# Patient Record
Sex: Male | Born: 1950
Health system: Southern US, Community
[De-identification: ages and names within clinical notes are randomized; demographics above are authoritative.]

## PROBLEM LIST (undated history)

## (undated) DIAGNOSIS — I10 Essential (primary) hypertension: Secondary | ICD-10-CM

## (undated) DIAGNOSIS — E785 Hyperlipidemia, unspecified: Secondary | ICD-10-CM

## (undated) DIAGNOSIS — C801 Malignant (primary) neoplasm, unspecified: Secondary | ICD-10-CM

## (undated) DIAGNOSIS — K7460 Unspecified cirrhosis of liver: Secondary | ICD-10-CM

## (undated) DIAGNOSIS — K759 Inflammatory liver disease, unspecified: Secondary | ICD-10-CM

## (undated) DIAGNOSIS — J449 Chronic obstructive pulmonary disease, unspecified: Secondary | ICD-10-CM

## (undated) HISTORY — PX: TONSILLECTOMY: SUR1361

## (undated) HISTORY — PX: COLONOSCOPY: SHX174

---

## 2003-02-03 ENCOUNTER — Encounter: Payer: Self-pay | Admitting: Emergency Medicine

## 2003-02-03 ENCOUNTER — Emergency Department (HOSPITAL_COMMUNITY): Admission: EM | Admit: 2003-02-03 | Discharge: 2003-02-03 | Payer: Self-pay | Admitting: Emergency Medicine

## 2007-12-27 ENCOUNTER — Emergency Department (HOSPITAL_COMMUNITY): Admission: EM | Admit: 2007-12-27 | Discharge: 2007-12-27 | Payer: Self-pay | Admitting: Emergency Medicine

## 2009-03-28 ENCOUNTER — Emergency Department (HOSPITAL_COMMUNITY): Admission: EM | Admit: 2009-03-28 | Discharge: 2009-03-28 | Payer: Self-pay | Admitting: Emergency Medicine

## 2009-04-03 ENCOUNTER — Emergency Department (HOSPITAL_COMMUNITY): Admission: EM | Admit: 2009-04-03 | Discharge: 2009-04-03 | Payer: Self-pay | Admitting: Family Medicine

## 2013-01-23 ENCOUNTER — Encounter (HOSPITAL_COMMUNITY): Payer: Self-pay | Admitting: Emergency Medicine

## 2013-01-23 ENCOUNTER — Emergency Department (HOSPITAL_COMMUNITY)
Admission: EM | Admit: 2013-01-23 | Discharge: 2013-01-24 | Disposition: A | Discharge status: Home / Self Care | Payer: BC Managed Care – PPO | Attending: Emergency Medicine | Admitting: Emergency Medicine

## 2013-01-23 DIAGNOSIS — M79609 Pain in unspecified limb: Secondary | ICD-10-CM | POA: Insufficient documentation

## 2013-01-23 DIAGNOSIS — F17200 Nicotine dependence, unspecified, uncomplicated: Secondary | ICD-10-CM | POA: Insufficient documentation

## 2013-01-23 DIAGNOSIS — I82401 Acute embolism and thrombosis of unspecified deep veins of right lower extremity: Secondary | ICD-10-CM

## 2013-01-23 LAB — D-DIMER, QUANTITATIVE: D-Dimer, Quant: 2.08 ug/mL-FEU — ABNORMAL HIGH (ref 0.00–0.48)

## 2013-01-23 MED ORDER — KETOROLAC TROMETHAMINE 60 MG/2ML IM SOLN
60.0000 mg | Freq: Once | INTRAMUSCULAR | Status: AC
  Administered 2013-01-23: 60 mg via INTRAMUSCULAR
  Filled 2013-01-23: qty 2

## 2013-01-23 MED ORDER — HYDROCODONE-ACETAMINOPHEN 5-325 MG PO TABS: 1.0000 | ORAL_TABLET | ORAL | Status: DC | PRN

## 2013-01-23 MED ORDER — ENOXAPARIN SODIUM 100 MG/ML ~~LOC~~ SOLN
100.0000 mg | Freq: Once | SUBCUTANEOUS | Status: AC
  Administered 2013-01-23: 100 mg via SUBCUTANEOUS
  Filled 2013-01-23 (×2): qty 1

## 2013-01-23 NOTE — ED Provider Notes (Signed)
CSN: [id number]     Arrival date & time 01/23/13  1952 History  This chart was scribed for non-physician practitioner Dierdre Forth, PA-C, working with Candyce Churn, MD by Leone Payor, ED Scribe. This patient was seen in room TR11C/TR11C and the patient's care was started at 1952.    Chief Complaint  Patient presents with  . Muscle Pain    The history is provided by the patient. No language interpreter was used.    HPI Comments: Wyatt Berry is a 62 y.o. male who presents to the Emergency Department complaining of gradually onset, constant, gradually worsening, moderate right calf pain that began 3 days ago. He denies any recent injury or trauma to the affected area. He denies strenuous activities such as playing sports or running.  He denies any associated swelling or redness. He denies any knee pain. Laying down worsens the pain. He has taken OTC pain medication with some relief. He denies fever, chills, nausea, emesis, chest pain, SOB. He denies history of CA, DVT or PE in the past. Pt is a current everyday 1 pack per day smoker and occasional alcohol user. Pt denies recent surgery, active cancer, long car/plane trips, sitting for long periods, broken bones, Hx of blood clot.  History reviewed. No pertinent past medical history. History reviewed. No pertinent past surgical history. No family history on file. History  Substance Use Topics  . Smoking status: Current Every Day Smoker  . Smokeless tobacco: Not on file  . Alcohol Use: Yes    Review of Systems  Musculoskeletal: Positive for myalgias (right calf pain).  All other systems reviewed and are negative.    Allergies  Review of patient's allergies indicates no known allergies.  Home Medications   Current Outpatient Rx  Name  Route  Sig  Dispense  Refill  . aspirin EC 325 MG tablet   Oral   Take 650 mg by mouth every 4 (four) hours as needed for pain.         Marland Kitchen HYDROcodone-acetaminophen (NORCO/VICODIN)  5-325 MG per tablet   Oral   Take 1-2 tablets by mouth every 4 (four) hours as needed for pain.   6 tablet   0    BP 180/100  Pulse 78  Temp(Src) 97.6 F (36.4 C) (Oral)  Resp 20  Ht 6\' 2"  (1.88 m)  Wt 235 lb (106.595 kg)  BMI 30.16 kg/m2  SpO2 98% Physical Exam  Nursing note and vitals reviewed. Constitutional: He is oriented to person, place, and time. He appears well-developed and well-nourished. No distress.  HENT:  Head: Normocephalic and atraumatic.  Eyes: Conjunctivae are normal.  Neck: Normal range of motion.  Cardiovascular: Normal rate, regular rhythm, normal heart sounds and intact distal pulses.   No murmur heard. Pulses:      Radial pulses are 2+ on the right side, and 2+ on the left side.       Dorsalis pedis pulses are 2+ on the right side, and 2+ on the left side.       Posterior tibial pulses are 2+ on the right side, and 2+ on the left side.  Capillary refill < 3 sec  Pulmonary/Chest: Effort normal and breath sounds normal. He has no decreased breath sounds. He has no wheezes. He has no rhonchi. He has no rales.  Musculoskeletal: Normal range of motion. He exhibits tenderness. He exhibits no edema.  ROM: Full ROM of bilateral lower extremities.  No pain to palpation of the right calf,  no palpable cord Positive Homan's sign on the right leg only Right leg: No swelling of the calf, no swelling of the lower leg, no edema  Neurological: He is alert and oriented to person, place, and time. Coordination normal.  Sensation intact to dull and sharp Strength 5/5 in the bilateral lower extremities  Skin: Skin is warm and dry. No rash noted. He is not diaphoretic. No erythema.  No chronic skin changes noted No tenting of the skin  Psychiatric: He has a normal mood and affect.    ED Course  Procedures (including critical care time)  DIAGNOSTIC STUDIES: Oxygen Saturation is 97% on RA, normal by my interpretation.    COORDINATION OF CARE: 10:15 PM Discussed  treatment plan with pt at bedside and pt agreed to plan.   Labs Review Labs Reviewed  D-DIMER, QUANTITATIVE - Abnormal; Notable for the following:    D-Dimer, Quant 2.08 (*)    All other components within normal limits   Imaging Review No results found.  MDM   1. Suspected DVT (deep vein thrombosis), right    Wyatt Berry presents with nontraumatic right calf pain for several days.  Pt is low risk on the Well's DVT score and he has no pain to palpation of the calf, no unilateral (or bilateral) swelling of the lower legs, no pitting edema, no palpable cord.  Of concern is that I do not have a source for the patient's pain.  Will obtain d-dimer as it is too late for a vascular study.  Pt is without chest pain, SOB or tachycardia.   (Pt initial vitals noted with tachycardia, but no tachycardia on my PE.)  Pt also without hypoxia on room air and denies cough or hemoptysis.    D-dimer significantly elevated at 2.08.  Significant concern for DVT.  Will dose with lovenox and schedule venous doppler for tomorrow morning.  Pt has been advised of this and is in agreement with the plan.    It has been determined that no acute conditions requiring further emergency intervention are present at this time. The patient/guardian have been advised of the diagnosis and plan. We have discussed signs and symptoms that warrant return to the ED, such as changes or worsening in symptoms.   Vital signs are stable at discharge.   BP 180/100  Pulse 78  Temp(Src) 97.6 F (36.4 C) (Oral)  Resp 20  Ht 6\' 2"  (1.88 m)  Wt 235 lb (106.595 kg)  BMI 30.16 kg/m2  SpO2 98%  Patient/guardian has voiced understanding and agreed to follow-up with the PCP or specialist.      Dierdre Forth, PA-C 01/24/13 0154

## 2013-01-23 NOTE — ED Notes (Signed)
Pt. reports right lower leg muscle ache for 2 days denies injury / ambulatory.

## 2013-01-24 ENCOUNTER — Ambulatory Visit (HOSPITAL_COMMUNITY)
Admission: RE | Admit: 2013-01-24 | Discharge: 2013-01-24 | Disposition: A | Discharge status: Home / Self Care | Payer: BC Managed Care – PPO | Source: Ambulatory Visit | Attending: Emergency Medicine | Admitting: Emergency Medicine

## 2013-01-24 DIAGNOSIS — M79609 Pain in unspecified limb: Secondary | ICD-10-CM | POA: Insufficient documentation

## 2013-01-24 NOTE — Progress Notes (Signed)
VASCULAR LAB PRELIMINARY  PRELIMINARY  PRELIMINARY  PRELIMINARY  Lower extremity venous Doppler completed.    Preliminary report:  There is no DVT or SVT noted in the right lower extremity.   Shametra Cumberland, RVT 01/24/2013, 10:53 AM

## 2013-01-26 NOTE — ED Provider Notes (Signed)
Medical screening examination/treatment/procedure(s) were performed by non-physician practitioner and as supervising physician I was immediately available for consultation/collaboration.  Candyce Churn, MD 01/26/13 1146

## 2013-01-30 ENCOUNTER — Encounter (HOSPITAL_COMMUNITY): Payer: Self-pay | Admitting: Emergency Medicine

## 2013-01-30 ENCOUNTER — Emergency Department (INDEPENDENT_AMBULATORY_CARE_PROVIDER_SITE_OTHER)
Admission: EM | Admit: 2013-01-30 | Discharge: 2013-01-30 | Disposition: A | Discharge status: Home / Self Care | Payer: BC Managed Care – PPO | Source: Home / Self Care

## 2013-01-30 DIAGNOSIS — M79604 Pain in right leg: Secondary | ICD-10-CM

## 2013-01-30 DIAGNOSIS — M79609 Pain in unspecified limb: Secondary | ICD-10-CM

## 2013-01-30 MED ORDER — HYDROCODONE-ACETAMINOPHEN 5-325 MG PO TABS: 1.0000 | ORAL_TABLET | ORAL | Status: DC | PRN

## 2013-01-30 NOTE — ED Provider Notes (Signed)
CSN: [id number]     Arrival date & time 01/30/13  1256 History   First MD Initiated Contact with Patient 01/30/13 1416     Chief Complaint  Patient presents with  . Leg Pain   (Consider location/radiation/quality/duration/timing/severity/associated sxs/prior Treatment) HPI Comments: 62 year old male is being seen for pain in the right calf that began last week. He presented to the emergency department for the same complaint and was evaluated. The d-dimer was 2.08, lthough there were no specific clinical findings on exam. He was placed on anticoagulation for the day and underwent venous duplex of the right lower extremity following day. Results of that was no evidence of DVT or superficial thrombophlebitis. He was treated with analgesics and discharged to followup with PCP.  Patient presents today with persistent pain in the right leg is in the posterior aspect of the leg. He states that it is not made worse by walking but does experience pain deep into the calf muscle. After walking and upon resting he has a throbbing pain in his leg. He denies injury or known recent or past trauma.   History reviewed. No pertinent past medical history. History reviewed. No pertinent past surgical history. History reviewed. No pertinent family history. History  Substance Use Topics  . Smoking status: Current Every Day Smoker  . Smokeless tobacco: Not on file  . Alcohol Use: Yes    Review of Systems  Constitutional: Negative.   HENT: Negative.   Respiratory: Negative.   Cardiovascular: Negative.   Gastrointestinal: Negative.   Genitourinary: Negative.   Musculoskeletal:       As per HPI  Skin: Negative.  Negative for color change, pallor, rash and wound.  Neurological: Negative for dizziness, weakness, numbness and headaches.    Allergies  Review of patient's allergies indicates no known allergies.  Home Medications   Current Outpatient Rx  Name  Route  Sig  Dispense  Refill  . aspirin EC  325 MG tablet   Oral   Take 650 mg by mouth every 4 (four) hours as needed for pain.         Marland Kitchen HYDROcodone-acetaminophen (NORCO/VICODIN) 5-325 MG per tablet   Oral   Take 1-2 tablets by mouth every 4 (four) hours as needed for pain.   6 tablet   0   . HYDROcodone-acetaminophen (NORCO/VICODIN) 5-325 MG per tablet   Oral   Take 1 tablet by mouth every 4 (four) hours as needed for pain.   20 tablet   0    BP 102/78  Pulse 121  Temp(Src) 98.5 F (36.9 C) (Oral)  Resp 22  SpO2 98% Physical Exam  Nursing note and vitals reviewed. Constitutional: He is oriented to person, place, and time. He appears well-developed and well-nourished. No distress.  HENT:  Head: Normocephalic and atraumatic.  Eyes: EOM are normal. Left eye exhibits no discharge.  Neck: Normal range of motion. Neck supple.  Cardiovascular: Regular rhythm and normal heart sounds.   Borderline tachycardia at a rate of 108 during exam.  Pulmonary/Chest: Effort normal and breath sounds normal. No respiratory distress. He has no wheezes. He has no rales.  Musculoskeletal: He exhibits no edema.  Right and left lower legs are symmetric. There appears to be no swelling/edema to the right lower extremity. Patient points to the proximal gastrocnemius as the most likely source of pain. Palpation of these areas reveal only minor tenderness. There is no discoloration or localized edema. Range of motion of the knee, ankle and toes are within  normal limits. Distal sensation is intact. Range of motion is intact. Although the pedal pulse cannot be detected by palpation auscultation by Doppler reveals a bold, strong regular pedal artery pulse.No warmth and color.  Neurological: He is alert and oriented to person, place, and time. No cranial nerve deficit.  Skin: Skin is warm and dry.  Psychiatric: He has a normal mood and affect.    ED Course  Procedures (including critical care time) Labs Review Labs Reviewed - No data to  display Imaging Review No results found.  MDM   1. Leg pain, posterior, right     Prescribed Norco 5 mg every 4-6 hours when necessary pain #20 The evaluation and test results are from the ER at the end of last week were reviewed. Although there was an increase in the d-dimer the venous duplex was negative.  The examination today was similar to that documented in emergency department in remarkable only for minor tenderness to deep palpation to the posterior calf. Differential includes musculoskeletal, small arterial ischemia.      Hayden Rasmussen, NP 01/30/13 1456

## 2013-01-30 NOTE — ED Provider Notes (Signed)
Medical screening examination/treatment/procedure(s) were performed by resident physician or non-physician practitioner and as supervising physician I was immediately available for consultation/collaboration.   Remonia Otte DOUGLAS MD.   Theadora Noyes D Bereket Gernert, MD 01/30/13 2024 

## 2013-01-30 NOTE — ED Notes (Signed)
C/o right leg pain which started last Thursday.   Patient has been to ER and r/o DVT.  Patient states that he has not had any changes of pain since he has been at ER.

## 2013-04-15 ENCOUNTER — Other Ambulatory Visit: Payer: Self-pay | Admitting: Gastroenterology

## 2013-05-21 ENCOUNTER — Ambulatory Visit (HOSPITAL_COMMUNITY): Admit: 2013-05-21 | Payer: Self-pay | Admitting: Gastroenterology

## 2013-05-21 ENCOUNTER — Encounter (HOSPITAL_COMMUNITY): Payer: Self-pay

## 2013-05-21 SURGERY — COLONOSCOPY
Anesthesia: Moderate Sedation

## 2013-09-01 ENCOUNTER — Emergency Department (HOSPITAL_COMMUNITY)
Admission: EM | Admit: 2013-09-01 | Discharge: 2013-09-02 | Disposition: A | Discharge status: Home / Self Care | Payer: BC Managed Care – PPO | Attending: Emergency Medicine | Admitting: Emergency Medicine

## 2013-09-01 ENCOUNTER — Emergency Department (HOSPITAL_COMMUNITY): Payer: BC Managed Care – PPO

## 2013-09-01 ENCOUNTER — Encounter (HOSPITAL_COMMUNITY): Payer: Self-pay | Admitting: Emergency Medicine

## 2013-09-01 DIAGNOSIS — M1990 Unspecified osteoarthritis, unspecified site: Secondary | ICD-10-CM

## 2013-09-01 DIAGNOSIS — S79929A Unspecified injury of unspecified thigh, initial encounter: Principal | ICD-10-CM

## 2013-09-01 DIAGNOSIS — X500XXA Overexertion from strenuous movement or load, initial encounter: Secondary | ICD-10-CM | POA: Insufficient documentation

## 2013-09-01 DIAGNOSIS — I10 Essential (primary) hypertension: Secondary | ICD-10-CM | POA: Insufficient documentation

## 2013-09-01 DIAGNOSIS — M4310 Spondylolisthesis, site unspecified: Secondary | ICD-10-CM | POA: Insufficient documentation

## 2013-09-01 DIAGNOSIS — Y9389 Activity, other specified: Secondary | ICD-10-CM | POA: Insufficient documentation

## 2013-09-01 DIAGNOSIS — S79919A Unspecified injury of unspecified hip, initial encounter: Secondary | ICD-10-CM | POA: Insufficient documentation

## 2013-09-01 DIAGNOSIS — M4316 Spondylolisthesis, lumbar region: Secondary | ICD-10-CM

## 2013-09-01 DIAGNOSIS — Y929 Unspecified place or not applicable: Secondary | ICD-10-CM | POA: Insufficient documentation

## 2013-09-01 DIAGNOSIS — F17200 Nicotine dependence, unspecified, uncomplicated: Secondary | ICD-10-CM | POA: Insufficient documentation

## 2013-09-01 DIAGNOSIS — M129 Arthropathy, unspecified: Secondary | ICD-10-CM | POA: Insufficient documentation

## 2013-09-01 DIAGNOSIS — Z79899 Other long term (current) drug therapy: Secondary | ICD-10-CM | POA: Insufficient documentation

## 2013-09-01 HISTORY — DX: Essential (primary) hypertension: I10

## 2013-09-01 MED ORDER — HYDROCODONE-ACETAMINOPHEN 5-325 MG PO TABS: 1.0000 | ORAL_TABLET | Freq: Four times a day (QID) | ORAL | Status: DC | PRN

## 2013-09-01 MED ORDER — DIAZEPAM 5 MG/ML IJ SOLN
5.0000 mg | Freq: Once | INTRAMUSCULAR | Status: AC
  Administered 2013-09-01: 5 mg via INTRAVENOUS
  Filled 2013-09-01: qty 2

## 2013-09-01 MED ORDER — HYDROMORPHONE HCL PF 1 MG/ML IJ SOLN
1.0000 mg | Freq: Once | INTRAMUSCULAR | Status: AC
  Administered 2013-09-01: 1 mg via INTRAVENOUS
  Filled 2013-09-01: qty 1

## 2013-09-01 NOTE — ED Provider Notes (Signed)
CSN: [id number]     Arrival date & time 09/01/13  2116 History   First MD Initiated Contact with Patient 09/01/13 2146     Chief Complaint  Patient presents with  . Hip Pain     (Consider location/radiation/quality/duration/timing/severity/associated sxs/prior Treatment) The history is provided by the patient.  ORRY SIGL is a 63 y.o. male hx of HTN here with R hip pain. Right hip pain for the last 3-4 days. It is worse with movement. Denies any falls or injury. Today he was laying on the couch and tried to get up and also heard a pop around the right hip area and suddenly had pain radiating down from the hip to the leg. Denies any back pain or injury. Denies weakness or numbness.    Past Medical History  Diagnosis Date  . Hypertension    History reviewed. No pertinent past surgical history. No family history on file. History  Substance Use Topics  . Smoking status: Current Every Day Smoker  . Smokeless tobacco: Not on file  . Alcohol Use: Yes    Review of Systems  Musculoskeletal:       R hip pain   All other systems reviewed and are negative.     Allergies  Review of patient's allergies indicates no known allergies.  Home Medications   Current Outpatient Rx  Name  Route  Sig  Dispense  Refill  . ibuprofen (ADVIL,MOTRIN) 200 MG tablet   Oral   Take 200 mg by mouth every 6 (six) hours as needed for moderate pain.         Marland Kitchen losartan-hydrochlorothiazide (HYZAAR) 100-12.5 MG per tablet   Oral   Take 1 tablet by mouth daily.         . tamsulosin (FLOMAX) 0.4 MG CAPS capsule   Oral   Take 0.4 mg by mouth daily after supper.          BP 127/71  Pulse 79  Temp(Src) 97.6 F (36.4 C) (Oral)  Resp 20  SpO2 98% Physical Exam  Nursing note and vitals reviewed. Constitutional: He is oriented to person, place, and time. He appears well-developed and well-nourished.  Slightly uncomfortable   HENT:  Head: Normocephalic.  Mouth/Throat: Oropharynx is clear  and moist.  Eyes: Conjunctivae are normal. Pupils are equal, round, and reactive to light.  Neck: Normal range of motion. Neck supple.  Cardiovascular: Normal rate, regular rhythm and normal heart sounds.   Pulmonary/Chest: Effort normal and breath sounds normal. No respiratory distress. He has no wheezes. He has no rales.  Abdominal: Soft. Bowel sounds are normal. He exhibits no distension. There is no tenderness.  Musculoskeletal: Normal range of motion.  No midline spinal tenderness. + straight leg raise R side. Nl ROM R hip   Neurological: He is alert and oriented to person, place, and time.  No saddle anesthesia   Skin: Skin is warm and dry.  Psychiatric: He has a normal mood and affect. His behavior is normal. Judgment and thought content normal.    ED Course  Procedures (including critical care time) Labs Review Labs Reviewed - No data to display Imaging Review Dg Lumbar Spine Complete  09/01/2013   CLINICAL DATA:  Back pain.  EXAM: LUMBAR SPINE - COMPLETE 4+ VIEW  COMPARISON:  None available for comparison at time of study interpretation.  FINDINGS: Apparent transitional anatomy, partially sacralized L5 vertebral body. Grade 1 L4-5 anterolisthesis without pars interarticularis defect. Moderate to severe L5-S1 degenerative disc disease, moderate L4-5  with severe lower lumbar facet arthropathy.  No destructive bony lesions. Sacroiliac joints are symmetric with mild degenerative change. Moderate aortoiliac vascular calcifications.  IMPRESSION: No acute fracture deformity. Grade 1 L4-5 anterolisthesis on degenerative basis. Transitional anatomy with sacralized L5 vertebral body.   Electronically Signed   By: Elon Alas   On: 09/01/2013 22:56   Dg Hip Complete Right  09/01/2013   CLINICAL DATA:  Right hip pain  EXAM: RIGHT HIP - COMPLETE 2+ VIEW  COMPARISON:  None.  FINDINGS: There is no evidence of hip fracture or dislocation. There is no evidence of arthropathy or other focal bone  abnormality.  IMPRESSION: Negative.   Electronically Signed   By: Kathreen Devoid   On: 09/01/2013 22:21     EKG Interpretation None      MDM   Final diagnoses:  None    Wyatt Berry is a 63 y.o. male here with R hip pain. I think likely radiating from back. Likely sciatica symptoms. No signs of cauda equina and was able to ambulate after pain meds. Xray showed L4-5 anterolisthesis likely causing the symptoms. Xray hip unremarkable. Will give pain meds, refer to ortho.     Wandra Arthurs, MD 09/01/13 2322

## 2013-09-01 NOTE — Discharge Instructions (Signed)
Take motrin 600 mg every 6 hrs for pain.   Take vicodin as prescribed for severe pain.   Follow up with an orthopedic doctor.   Return to ER if you have severe pain, weakness, numbness, trouble urinating.

## 2013-09-01 NOTE — ED Notes (Signed)
Per PTAR: Pt c/o R hip pain since Thursday last week, pain worsened today. Denies fall or trauma. Pain first started when pt got up out of bed Thursday morning. Pt states he felt a pop in hip tonight when getting up from couch. No deformities noted. No shortening or rotation, good pulses in both extremities. Pt was ambulatory prior to tonight. Able to move toes. A&O x 4.

## 2013-09-01 NOTE — ED Notes (Signed)
Bed: WA03 Expected date:  Expected time:  Means of arrival:  Comments: EMS 63yo M hip pain, felt a pop

## 2014-08-22 ENCOUNTER — Other Ambulatory Visit: Payer: Self-pay | Admitting: Physician Assistant

## 2015-03-03 ENCOUNTER — Other Ambulatory Visit: Payer: Self-pay | Admitting: Internal Medicine

## 2015-03-03 DIAGNOSIS — R7989 Other specified abnormal findings of blood chemistry: Secondary | ICD-10-CM

## 2015-03-03 DIAGNOSIS — R945 Abnormal results of liver function studies: Principal | ICD-10-CM

## 2015-03-05 ENCOUNTER — Other Ambulatory Visit: Payer: Self-pay | Admitting: Internal Medicine

## 2015-03-05 ENCOUNTER — Ambulatory Visit
Admission: RE | Admit: 2015-03-05 | Discharge: 2015-03-05 | Disposition: A | Discharge status: Home / Self Care | Payer: BC Managed Care – PPO | Source: Ambulatory Visit | Attending: Internal Medicine | Admitting: Internal Medicine

## 2015-03-05 DIAGNOSIS — R945 Abnormal results of liver function studies: Principal | ICD-10-CM

## 2015-03-05 DIAGNOSIS — R7989 Other specified abnormal findings of blood chemistry: Secondary | ICD-10-CM

## 2015-03-17 ENCOUNTER — Other Ambulatory Visit: Payer: BC Managed Care – PPO

## 2015-03-18 ENCOUNTER — Telehealth: Payer: Self-pay | Admitting: Lab

## 2015-03-18 NOTE — Telephone Encounter (Signed)
Pt scheduled for labs 03/23/2015-He will then be given an appmt with Dr. Linus Salmons

## 2015-03-23 ENCOUNTER — Other Ambulatory Visit: Payer: BC Managed Care – PPO

## 2015-03-23 DIAGNOSIS — B182 Chronic viral hepatitis C: Secondary | ICD-10-CM

## 2015-03-23 LAB — HEPATITIS B SURFACE ANTIGEN: Hepatitis B Surface Ag: NEGATIVE

## 2015-03-23 LAB — HEPATITIS A ANTIBODY, TOTAL: HEP A TOTAL AB: NONREACTIVE

## 2015-03-23 LAB — IRON: Iron: 186 ug/dL — ABNORMAL HIGH (ref 50–180)

## 2015-03-23 LAB — HEPATITIS B SURFACE ANTIBODY, QUANTITATIVE: Hepatitis B-Post: 0 m[IU]/mL

## 2015-03-23 LAB — HEPATITIS B CORE ANTIBODY, TOTAL: Hep B Core Total Ab: NONREACTIVE

## 2015-03-23 NOTE — Telephone Encounter (Signed)
Diane, please see the previous message.

## 2015-03-23 NOTE — Telephone Encounter (Signed)
Pt's PCP called and decided they want the pt to go to the [facility] instead. PCP is requesting that all lab results be sent back to them @ fax [phone]. Please advise

## 2015-03-23 NOTE — Telephone Encounter (Signed)
Pt came in for labs today and given an appt with Dr. Linus Salmons for 05/06/2015-PCP's contact informed.

## 2015-03-24 LAB — PROTIME-INR
INR: 1.05 (ref <1.50)
Prothrombin Time: 13.8 seconds (ref 11.6–15.2)

## 2015-03-24 LAB — ANA: Anti Nuclear Antibody(ANA): NEGATIVE

## 2015-03-24 LAB — HIV ANTIBODY (ROUTINE TESTING W REFLEX): HIV 1&2 Ab, 4th Generation: NONREACTIVE

## 2015-03-24 LAB — HEPATITIS C RNA QUANTITATIVE
HCV QUANT: 146023 [IU]/mL — AB (ref <15)
HCV Quantitative Log: 5.16 {Log} — ABNORMAL HIGH (ref <1.18)

## 2015-03-26 ENCOUNTER — Other Ambulatory Visit: Payer: Self-pay | Admitting: Internal Medicine

## 2015-03-26 LAB — HEPATITIS C GENOTYPE: HCV GENOTYPE: 1

## 2015-03-30 ENCOUNTER — Other Ambulatory Visit (HOSPITAL_COMMUNITY): Payer: Self-pay | Admitting: Nurse Practitioner

## 2015-03-30 DIAGNOSIS — B1920 Unspecified viral hepatitis C without hepatic coma: Secondary | ICD-10-CM

## 2015-04-29 ENCOUNTER — Ambulatory Visit (HOSPITAL_COMMUNITY)
Admission: RE | Admit: 2015-04-29 | Discharge: 2015-04-29 | Disposition: A | Discharge status: Home / Self Care | Payer: BC Managed Care – PPO | Source: Ambulatory Visit | Attending: Nurse Practitioner | Admitting: Nurse Practitioner

## 2015-04-29 DIAGNOSIS — R932 Abnormal findings on diagnostic imaging of liver and biliary tract: Secondary | ICD-10-CM | POA: Insufficient documentation

## 2015-04-29 DIAGNOSIS — B1920 Unspecified viral hepatitis C without hepatic coma: Secondary | ICD-10-CM | POA: Insufficient documentation

## 2015-05-06 ENCOUNTER — Encounter: Payer: BC Managed Care – PPO | Admitting: Internal Medicine

## 2015-10-12 ENCOUNTER — Other Ambulatory Visit: Payer: Self-pay | Admitting: Nurse Practitioner

## 2015-10-12 DIAGNOSIS — K7469 Other cirrhosis of liver: Secondary | ICD-10-CM

## 2015-10-20 ENCOUNTER — Ambulatory Visit
Admission: RE | Admit: 2015-10-20 | Discharge: 2015-10-20 | Disposition: A | Discharge status: Home / Self Care | Payer: BC Managed Care – PPO | Source: Ambulatory Visit | Attending: Nurse Practitioner | Admitting: Nurse Practitioner

## 2015-10-20 DIAGNOSIS — K7469 Other cirrhosis of liver: Secondary | ICD-10-CM

## 2016-03-01 ENCOUNTER — Other Ambulatory Visit: Payer: Self-pay | Admitting: Nurse Practitioner

## 2016-03-01 DIAGNOSIS — K7469 Other cirrhosis of liver: Secondary | ICD-10-CM

## 2016-04-11 ENCOUNTER — Ambulatory Visit
Admission: RE | Admit: 2016-04-11 | Discharge: 2016-04-11 | Disposition: A | Discharge status: Home / Self Care | Payer: BC Managed Care – PPO | Source: Ambulatory Visit | Attending: Nurse Practitioner | Admitting: Nurse Practitioner

## 2016-04-11 DIAGNOSIS — K7469 Other cirrhosis of liver: Secondary | ICD-10-CM

## 2016-05-01 ENCOUNTER — Inpatient Hospital Stay (HOSPITAL_COMMUNITY)
Admission: EM | Admit: 2016-05-01 | Discharge: 2016-05-06 | DRG: 175 | Disposition: A | Discharge status: Home / Self Care | Payer: BC Managed Care – PPO | Attending: Family Medicine | Admitting: Family Medicine

## 2016-05-01 ENCOUNTER — Emergency Department (HOSPITAL_COMMUNITY): Payer: BC Managed Care – PPO

## 2016-05-01 ENCOUNTER — Encounter (HOSPITAL_COMMUNITY): Payer: Self-pay

## 2016-05-01 DIAGNOSIS — J439 Emphysema, unspecified: Secondary | ICD-10-CM | POA: Diagnosis present

## 2016-05-01 DIAGNOSIS — K7460 Unspecified cirrhosis of liver: Secondary | ICD-10-CM | POA: Diagnosis present

## 2016-05-01 DIAGNOSIS — D6851 Activated protein C resistance: Secondary | ICD-10-CM | POA: Diagnosis present

## 2016-05-01 DIAGNOSIS — Z833 Family history of diabetes mellitus: Secondary | ICD-10-CM

## 2016-05-01 DIAGNOSIS — Z8249 Family history of ischemic heart disease and other diseases of the circulatory system: Secondary | ICD-10-CM

## 2016-05-01 DIAGNOSIS — F17210 Nicotine dependence, cigarettes, uncomplicated: Secondary | ICD-10-CM | POA: Diagnosis present

## 2016-05-01 DIAGNOSIS — I071 Rheumatic tricuspid insufficiency: Secondary | ICD-10-CM | POA: Diagnosis present

## 2016-05-01 DIAGNOSIS — I2692 Saddle embolus of pulmonary artery without acute cor pulmonale: Principal | ICD-10-CM | POA: Diagnosis present

## 2016-05-01 DIAGNOSIS — B1920 Unspecified viral hepatitis C without hepatic coma: Secondary | ICD-10-CM | POA: Diagnosis present

## 2016-05-01 DIAGNOSIS — I10 Essential (primary) hypertension: Secondary | ICD-10-CM | POA: Diagnosis present

## 2016-05-01 DIAGNOSIS — Z79899 Other long term (current) drug therapy: Secondary | ICD-10-CM

## 2016-05-01 DIAGNOSIS — I2602 Saddle embolus of pulmonary artery with acute cor pulmonale: Secondary | ICD-10-CM | POA: Diagnosis present

## 2016-05-01 DIAGNOSIS — R7303 Prediabetes: Secondary | ICD-10-CM | POA: Diagnosis present

## 2016-05-01 DIAGNOSIS — I824Y2 Acute embolism and thrombosis of unspecified deep veins of left proximal lower extremity: Secondary | ICD-10-CM

## 2016-05-01 DIAGNOSIS — I82412 Acute embolism and thrombosis of left femoral vein: Secondary | ICD-10-CM | POA: Diagnosis present

## 2016-05-01 DIAGNOSIS — R55 Syncope and collapse: Secondary | ICD-10-CM | POA: Diagnosis not present

## 2016-05-01 DIAGNOSIS — J9601 Acute respiratory failure with hypoxia: Secondary | ICD-10-CM | POA: Diagnosis present

## 2016-05-01 DIAGNOSIS — I272 Pulmonary hypertension, unspecified: Secondary | ICD-10-CM | POA: Diagnosis present

## 2016-05-01 DIAGNOSIS — I959 Hypotension, unspecified: Secondary | ICD-10-CM | POA: Diagnosis present

## 2016-05-01 DIAGNOSIS — D6852 Prothrombin gene mutation: Secondary | ICD-10-CM | POA: Diagnosis present

## 2016-05-01 DIAGNOSIS — D72829 Elevated white blood cell count, unspecified: Secondary | ICD-10-CM | POA: Diagnosis present

## 2016-05-01 DIAGNOSIS — I2609 Other pulmonary embolism with acute cor pulmonale: Secondary | ICD-10-CM

## 2016-05-01 DIAGNOSIS — E871 Hypo-osmolality and hyponatremia: Secondary | ICD-10-CM | POA: Diagnosis present

## 2016-05-01 HISTORY — DX: Inflammatory liver disease, unspecified: K75.9

## 2016-05-01 LAB — URINALYSIS, ROUTINE W REFLEX MICROSCOPIC
BILIRUBIN URINE: NEGATIVE
Bacteria, UA: NONE SEEN
GLUCOSE, UA: NEGATIVE mg/dL
HGB URINE DIPSTICK: NEGATIVE
KETONES UR: NEGATIVE mg/dL
LEUKOCYTES UA: NEGATIVE
NITRITE: NEGATIVE
PROTEIN: 30 mg/dL — AB
Specific Gravity, Urine: 1.021 (ref 1.005–1.030)
Squamous Epithelial / LPF: NONE SEEN
pH: 5 (ref 5.0–8.0)

## 2016-05-01 LAB — DIFFERENTIAL
BASOS ABS: 0 10*3/uL (ref 0.0–0.1)
BASOS PCT: 0 %
EOS ABS: 0 10*3/uL (ref 0.0–0.7)
EOS PCT: 0 %
LYMPHS ABS: 2 10*3/uL (ref 0.7–4.0)
Lymphocytes Relative: 14 %
Monocytes Absolute: 0.9 10*3/uL (ref 0.1–1.0)
Monocytes Relative: 6 %
NEUTROS PCT: 80 %
Neutro Abs: 11.2 10*3/uL — ABNORMAL HIGH (ref 1.7–7.7)

## 2016-05-01 LAB — BASIC METABOLIC PANEL
ANION GAP: 11 (ref 5–15)
BUN: 13 mg/dL (ref 6–20)
CHLORIDE: 100 mmol/L — AB (ref 101–111)
CO2: 21 mmol/L — ABNORMAL LOW (ref 22–32)
Calcium: 8.7 mg/dL — ABNORMAL LOW (ref 8.9–10.3)
Creatinine, Ser: 1.17 mg/dL (ref 0.61–1.24)
GFR calc non Af Amer: >60 mL/min (ref >60)
Glucose, Bld: 162 mg/dL — ABNORMAL HIGH (ref 65–99)
POTASSIUM: 3.7 mmol/L (ref 3.5–5.1)
Sodium: 132 mmol/L — ABNORMAL LOW (ref 135–145)

## 2016-05-01 LAB — CBC
HCT: 40.7 % (ref 39.0–52.0)
Hemoglobin: 14.8 g/dL (ref 13.0–17.0)
MCH: 31.4 pg (ref 26.0–34.0)
MCHC: 36.4 g/dL — ABNORMAL HIGH (ref 30.0–36.0)
MCV: 86.4 fL (ref 78.0–100.0)
Platelets: 185 10*3/uL (ref 150–400)
RBC: 4.71 MIL/uL (ref 4.22–5.81)
RDW: 13.8 % (ref 11.5–15.5)
WBC: 14 10*3/uL — ABNORMAL HIGH (ref 4.0–10.5)

## 2016-05-01 LAB — CBG MONITORING, ED: GLUCOSE-CAPILLARY: 176 mg/dL — AB (ref 65–99)

## 2016-05-01 NOTE — ED Provider Notes (Signed)
[facility] [place] DEPT Provider Note   CSN: [id number] Arrival date & time: 05/01/16  2223     History   Chief Complaint Chief Complaint  Patient presents with  . Loss of Consciousness    HPI Wyatt Berry is a 65 y.o. male.  He complains of upper abdominal pain and cough for the last 2 weeks. Abdominal pain was initially right upper quadrant and is now settled in the left upper quadrant. Cough is productive of sputum which he swallows and he has never noticed the color of the sputum. He denies nausea or vomiting. There is associated dyspnea. Abdominal pain is worse when he lays down and better when he sits up. He rates pain at 8/10 when he lays down, and 3/10 when he sits up. Pain is also worse when he takes a deep breath. Symptoms of been gradually worsening. Tonight, he had a syncopal episode of with some generalized shaking. He was sitting on the bed when he is syncopal episode occurred and possible consciousness was very brief. There is no loss of control of bowel or bladder, and no bit lip or tongue. He denies fever or chills but has had some sweats. He was noted to be sweaty when he had a syncopal episode.   The history is provided by the patient and the spouse.  Loss of Consciousness      Past Medical History:  Diagnosis Date  . Hepatitis    c  . Hypertension     There are no active problems to display for this patient.   History reviewed. No pertinent surgical history.     Home Medications    Prior to Admission medications   Medication Sig Start Date End Date Taking? Authorizing Provider  HYDROcodone-acetaminophen (NORCO/VICODIN) 5-325 MG per tablet Take 1-2 tablets by mouth every 6 (six) hours as needed for moderate pain. 09/01/13   Drenda Freeze, MD  ibuprofen (ADVIL,MOTRIN) 200 MG tablet Take 200 mg by mouth every 6 (six) hours as needed for moderate pain.    Historical Provider, MD  losartan-hydrochlorothiazide (HYZAAR) 100-12.5 MG per tablet Take 1  tablet by mouth daily.    Historical Provider, MD  tamsulosin (FLOMAX) 0.4 MG CAPS capsule Take 0.4 mg by mouth daily after supper.    Historical Provider, MD    Family History No family history on file.  Social History Social History  Substance Use Topics  . Smoking status: Current Every Day Smoker    Packs/day: 0.50    Types: Cigarettes  . Smokeless tobacco: Never Used  . Alcohol use No     Allergies   Patient has no known allergies.   Review of Systems Review of Systems  Cardiovascular: Positive for syncope.  All other systems reviewed and are negative.    Physical Exam Updated Vital Signs BP 170/91 (BP Location: Right Arm)   Pulse 112   Temp 98.4 F (36.9 C) (Oral)   Resp 24   Ht 6\' 2"  (1.88 m)   Wt 256 lb (116.1 kg)   SpO2 95%   BMI 32.87 kg/m   Physical Exam  Nursing note and vitals reviewed.  65 year old male, resting comfortably and in no acute distress. Vital signs are Significant for tachycardia and hypertension. Oxygen saturation is 95%, which is normal. Head is normocephalic and atraumatic. PERRLA, EOMI. Oropharynx is clear. Neck is nontender and supple without adenopathy or JVD. Back is nontender and there is no CVA tenderness. Lungs have fine bibasilar rales without wheezes or  rhonchi. Chest is nontender. Heart has regular rate and rhythm without murmur. Abdomen is soft, flat, nontender without masses or hepatosplenomegaly and peristalsis is normoactive. Extremities have trace edema, full range of motion is present. Skin is warm and dry without rash. Neurologic: Mental status is normal, cranial nerves are intact, there are no motor or sensory deficits.  ED Treatments / Results  Labs (all labs ordered are listed, but only abnormal results are displayed) Labs Reviewed  BASIC METABOLIC PANEL - Abnormal; Notable for the following:       Result Value   Sodium 132 (*)    Chloride 100 (*)    CO2 21 (*)    Glucose, Bld 162 (*)    Calcium 8.7  (*)    All other components within normal limits  URINALYSIS, ROUTINE W REFLEX MICROSCOPIC - Abnormal; Notable for the following:    Protein, ur 30 (*)    All other components within normal limits  TROPONIN I - Abnormal; Notable for the following:    Troponin I 0.03 (*)    All other components within normal limits  D-DIMER, QUANTITATIVE (NOT AT [facility]) - Abnormal; Notable for the following:    D-Dimer, Quant >20.00 (*)    All other components within normal limits  HEPATIC FUNCTION PANEL - Abnormal; Notable for the following:    Albumin 3.0 (*)    All other components within normal limits  CBC - Abnormal; Notable for the following:    WBC 14.0 (*)    MCHC 36.4 (*)    All other components within normal limits  DIFFERENTIAL - Abnormal; Notable for the following:    Neutro Abs 11.2 (*)    All other components within normal limits  CBG MONITORING, ED - Abnormal; Notable for the following:    Glucose-Capillary 176 (*)    All other components within normal limits  CBG MONITORING, ED - Abnormal; Notable for the following:    Glucose-Capillary 156 (*)    All other components within normal limits  BRAIN NATRIURETIC PEPTIDE  LIPASE, BLOOD  HEPARIN LEVEL (UNFRACTIONATED)    EKG  EKG Interpretation  Date/Time:  Sunday May 01 2016 22:23:57 EST Ventricular Rate:  116 PR Interval:    QRS Duration: 86 QT Interval:  357 QTC Calculation: 496 R Axis:   47 Text Interpretation:  Sinus tachycardia Anteroseptal infarct, age indeterminate When compared with ECG of 01/30/2013, Anteroseptal infarct , age undetermined is now Present Confirmed by [facility]  MD, Burlie Cajamarca ([id number]) on 05/01/2016 10:56:41 PM       Radiology Dg Chest 2 View  Result Date: 05/02/2016 CLINICAL DATA:  Productive cough and dyspnea for several days. Syncope at home. EXAM: CHEST  2 VIEW COMPARISON:  09/01/2013 lumbar spine radiographs which partially the lung bases FINDINGS: Heart is top-normal size with aortic atherosclerosis.  Upper lobe predominant hyperinflation is noted bilaterally. More confluent airspace opacity noted peripherally at the left lung base suspicious for pneumonia. Left apical scarring is suggested. No significant effusion or pneumothorax. IMPRESSION: Confluent airspace opacity at the left lung base laterally. Findings may reflect pneumonia given patient's symptoms. Ill-defined area of spiculation in the left upper lobe may reflect scarring. Followup PA and lateral chest X-ray is recommended in 3-4 weeks following trial of antibiotic therapy to ensure resolution and exclude underlying malignancy. Electronically Signed   By: Ashley Royalty M.D.   On: 05/02/2016 00:35   Ct Angio Chest Pe W And/or Wo Contrast  Result Date: 05/02/2016 CLINICAL DATA:  65 year old male with  shortness of breath and chest pain. EXAM: CT ANGIOGRAPHY CHEST WITH CONTRAST TECHNIQUE: Multidetector CT imaging of the chest was performed using the standard protocol during bolus administration of intravenous contrast. Multiplanar CT image reconstructions and MIPs were obtained to evaluate the vascular anatomy. CONTRAST:  100 cc Isovue 370 COMPARISON:  Chest radiograph dated 05/01/2016 FINDINGS: Cardiovascular: There are large bilateral pulmonary artery emboli extending from the central pulmonary artery is into the lobar, and segmental branches. The largest clot burden involves the left lower lobe pulmonary artery with near complete occlusion of the left lower lobe branches. There is mild dilatation of the main pulmonary trunk suggestive of associated pulmonary hypertension. There is enlargement of the right ventricle with flattening of the intraventricular septum compatible with right cardiac straining. The RV/LV ratio is 1.5. There is no pericardial effusion. Multi vessel coronary vascular calcifications primarily involving the LAD and left circumflex artery. There is atherosclerotic calcification of the thoracic aorta. No aneurysmal dilatation or  evidence of dissection. The origins of the great vessels of the aortic arch appear patent. Mediastinum/Nodes: There is no hilar or mediastinal adenopathy. The esophagus and the thyroid gland are grossly unremarkable. Lungs/Pleura: There is emphysematous changes of the lungs. Wedge-shaped area of pleural based ground-glass density involving the left lower lobe most compatible with pulmonary infarct. A 2.3 x 1.6 cm focal pleural based consolidative area in the left a packs noted which may represent scarring, an area of pulmonary infarct, and less likely infiltrate. Underlying mass is not excluded. Correlation with clinical exam and follow-up recommended. Subsegmental right lung base densities likely represent atelectatic changes/ scarring versus less likely infarct. There is a small left pleural effusion. No pneumothorax. The central airways are patent. Upper Abdomen: There is irregularity of the hepatic contour likely related to underlying cirrhosis. Musculoskeletal: No chest wall abnormality. No acute or significant osseous findings. Review of the MIP images confirms the above findings. IMPRESSION: Large bilateral pulmonary artery emboli involving the central pulmonary artery is with extension into the lobar and segmental branches. The largest clot burden involves the left lower lobe pulmonary arteries with associated focal left lower lobe pulmonary infarct. Positive for acute PE with CTevidence of right heart strain (RV/LV Ratio = 1.5) consistent with at least submassive (intermediate risk) PE. The presence of right heart strain has been associated with an increased risk of morbidity and mortality. Focal left apical consolidative changes may represent scarring, infarct, or less likely infiltrate. A pulmonary mass is not excluded. Follow-up recommended. Emphysema. These results were called by telephone at the time of interpretation on 05/02/2016 at 3:35 am to Nurse Felps, who verbally acknowledged these results.  Electronically Signed   By: Anner Crete M.D.   On: 05/02/2016 03:36    Procedures Procedures (including critical care time) CRITICAL CARE Performed by: [id number] Total critical care time: 45 minutes Critical care time was exclusive of separately billable procedures and treating other patients. Critical care was necessary to treat or prevent imminent or life-threatening deterioration. Critical care was time spent personally by me on the following activities: development of treatment plan with patient and/or surrogate as well as nursing, discussions with consultants, evaluation of patient's response to treatment, examination of patient, obtaining history from patient or surrogate, ordering and performing treatments and interventions, ordering and review of laboratory studies, ordering and review of radiographic studies, pulse oximetry and re-evaluation of patient's condition.  Medications Ordered in ED Medications  iopamidol (ISOVUE-370) 76 % injection (not administered)  heparin ADULT infusion 100 units/mL (25000 units/283mL  sodium chloride 0.45%) (1,600 Units/hr Intravenous New Bag/Given 05/02/16 0351)  cefTRIAXone (ROCEPHIN) 1 g in dextrose 5 % 50 mL IVPB (0 g Intravenous Stopped 05/02/16 0301)  azithromycin (ZITHROMAX) 500 mg in dextrose 5 % 250 mL IVPB (500 mg Intravenous New Bag/Given 05/02/16 0153)  morphine 4 MG/ML injection 4 mg (4 mg Intravenous Given 05/02/16 0149)  heparin bolus via infusion 6,000 Units (6,000 Units Intravenous Bolus from Bag 05/02/16 0353)     Initial Impression / Assessment and Plan / ED Course  I have reviewed the triage vital signs and the nursing notes.  Pertinent labs & imaging results that were available during my care of the patient were reviewed by me and considered in my medical decision making (see chart for details).  Clinical Course    Cough, dyspnea, abdominal pain with syncopal episode tonight. Cause is unclear. Old records are reviewed,  and he has no relevant past visits. Screening labs are obtained and will obtain chest x-ray. With tachycardia and dyspnea, need to consider possibility of pulmonary embolism, so d-dimer is obtained.  Chest x-ray suggests possible pneumonia and he is started on ceftriaxone and azithromycin. D-dimer is markedly elevated and he is being sent for CT angiogram of the chest. He had an episode where he got lightheaded and hypotensive and diaphoretic. This is very similar to what he had at home. Blood pressure came up promptly with fluid administration. Troponin is borderline at 0.03 and will need to be followed closely.  CT angiogram shows evidence of bilateral pulmonary emboli with evidence of right heart strain. He is started on heparin. Case is discussed with Dr. Loleta Books of [facility] who agrees to admit the patient.  Final Clinical Impressions(s) / ED Diagnoses   Final diagnoses:  Other acute pulmonary embolism with acute cor pulmonale (HCC)  Syncope and collapse    New Prescriptions New Prescriptions   No medications on file     Delora Fuel, MD [id number]

## 2016-05-01 NOTE — ED Triage Notes (Signed)
Pt arrives from home via [facility]. Pt had witnessed syncopal episode at home. Pt wife reports he was sitting on couch and went unresponsive for several seconds. Pt does not recall incident. No fall associated with symptoms. Pt reports being diaphoretic upon waking up. He does not recall incident. Pt denies chest pain, N/V. PT endorses having LUQ pain x 2 weeks. He reports taking medication for HEP C 1 year ago. [place] this month was normal per pt.

## 2016-05-02 ENCOUNTER — Emergency Department (HOSPITAL_COMMUNITY): Payer: BC Managed Care – PPO

## 2016-05-02 ENCOUNTER — Inpatient Hospital Stay (HOSPITAL_COMMUNITY): Payer: BC Managed Care – PPO

## 2016-05-02 ENCOUNTER — Encounter (HOSPITAL_COMMUNITY): Payer: Self-pay | Admitting: Family Medicine

## 2016-05-02 DIAGNOSIS — J9601 Acute respiratory failure with hypoxia: Secondary | ICD-10-CM

## 2016-05-02 DIAGNOSIS — E871 Hypo-osmolality and hyponatremia: Secondary | ICD-10-CM

## 2016-05-02 DIAGNOSIS — I2692 Saddle embolus of pulmonary artery without acute cor pulmonale: Secondary | ICD-10-CM | POA: Diagnosis present

## 2016-05-02 DIAGNOSIS — B1920 Unspecified viral hepatitis C without hepatic coma: Secondary | ICD-10-CM | POA: Diagnosis present

## 2016-05-02 DIAGNOSIS — Z833 Family history of diabetes mellitus: Secondary | ICD-10-CM | POA: Diagnosis not present

## 2016-05-02 DIAGNOSIS — Z79899 Other long term (current) drug therapy: Secondary | ICD-10-CM | POA: Diagnosis not present

## 2016-05-02 DIAGNOSIS — D72829 Elevated white blood cell count, unspecified: Secondary | ICD-10-CM | POA: Diagnosis present

## 2016-05-02 DIAGNOSIS — R55 Syncope and collapse: Secondary | ICD-10-CM

## 2016-05-02 DIAGNOSIS — K7469 Other cirrhosis of liver: Secondary | ICD-10-CM

## 2016-05-02 DIAGNOSIS — I2602 Saddle embolus of pulmonary artery with acute cor pulmonale: Secondary | ICD-10-CM | POA: Diagnosis not present

## 2016-05-02 DIAGNOSIS — B182 Chronic viral hepatitis C: Secondary | ICD-10-CM | POA: Diagnosis not present

## 2016-05-02 DIAGNOSIS — I10 Essential (primary) hypertension: Secondary | ICD-10-CM | POA: Diagnosis not present

## 2016-05-02 DIAGNOSIS — D6852 Prothrombin gene mutation: Secondary | ICD-10-CM | POA: Diagnosis present

## 2016-05-02 DIAGNOSIS — I82409 Acute embolism and thrombosis of unspecified deep veins of unspecified lower extremity: Secondary | ICD-10-CM

## 2016-05-02 DIAGNOSIS — I824Y2 Acute embolism and thrombosis of unspecified deep veins of left proximal lower extremity: Secondary | ICD-10-CM | POA: Diagnosis not present

## 2016-05-02 DIAGNOSIS — R7303 Prediabetes: Secondary | ICD-10-CM | POA: Diagnosis present

## 2016-05-02 DIAGNOSIS — I2699 Other pulmonary embolism without acute cor pulmonale: Secondary | ICD-10-CM | POA: Diagnosis not present

## 2016-05-02 DIAGNOSIS — I82412 Acute embolism and thrombosis of left femoral vein: Secondary | ICD-10-CM | POA: Diagnosis present

## 2016-05-02 DIAGNOSIS — I272 Pulmonary hypertension, unspecified: Secondary | ICD-10-CM | POA: Diagnosis present

## 2016-05-02 DIAGNOSIS — D6851 Activated protein C resistance: Secondary | ICD-10-CM | POA: Diagnosis present

## 2016-05-02 DIAGNOSIS — Z8249 Family history of ischemic heart disease and other diseases of the circulatory system: Secondary | ICD-10-CM | POA: Diagnosis not present

## 2016-05-02 DIAGNOSIS — F17210 Nicotine dependence, cigarettes, uncomplicated: Secondary | ICD-10-CM | POA: Diagnosis present

## 2016-05-02 DIAGNOSIS — J439 Emphysema, unspecified: Secondary | ICD-10-CM | POA: Diagnosis present

## 2016-05-02 DIAGNOSIS — K7460 Unspecified cirrhosis of liver: Secondary | ICD-10-CM | POA: Diagnosis present

## 2016-05-02 DIAGNOSIS — I959 Hypotension, unspecified: Secondary | ICD-10-CM | POA: Diagnosis present

## 2016-05-02 DIAGNOSIS — I071 Rheumatic tricuspid insufficiency: Secondary | ICD-10-CM | POA: Diagnosis present

## 2016-05-02 HISTORY — DX: Acute embolism and thrombosis of unspecified deep veins of unspecified lower extremity: I82.409

## 2016-05-02 HISTORY — DX: Other pulmonary embolism without acute cor pulmonale: I26.99

## 2016-05-02 LAB — HEPATIC FUNCTION PANEL
ALBUMIN: 3 g/dL — AB (ref 3.5–5.0)
ALT: 18 U/L (ref 17–63)
AST: 28 U/L (ref 15–41)
Alkaline Phosphatase: 61 U/L (ref 38–126)
Bilirubin, Direct: 0.3 mg/dL (ref 0.1–0.5)
Indirect Bilirubin: 0.6 mg/dL (ref 0.3–0.9)
Total Bilirubin: 0.9 mg/dL (ref 0.3–1.2)
Total Protein: 7.5 g/dL (ref 6.5–8.1)

## 2016-05-02 LAB — GLUCOSE, CAPILLARY: GLUCOSE-CAPILLARY: 157 mg/dL — AB (ref 65–99)

## 2016-05-02 LAB — CBG MONITORING, ED: Glucose-Capillary: 156 mg/dL — ABNORMAL HIGH (ref 65–99)

## 2016-05-02 LAB — ECHOCARDIOGRAM COMPLETE
HEIGHTINCHES: 74 in
WEIGHTICAEL: 3922.42 [oz_av]

## 2016-05-02 LAB — HEPARIN LEVEL (UNFRACTIONATED)
HEPARIN UNFRACTIONATED: 0.59 [IU]/mL (ref 0.30–0.70)
Heparin Unfractionated: 0.46 IU/mL (ref 0.30–0.70)

## 2016-05-02 LAB — D-DIMER, QUANTITATIVE (NOT AT ARMC)

## 2016-05-02 LAB — LIPASE, BLOOD: Lipase: 15 U/L (ref 11–51)

## 2016-05-02 LAB — TROPONIN I: TROPONIN I: 0.03 ng/mL — AB (ref <0.03)

## 2016-05-02 LAB — BRAIN NATRIURETIC PEPTIDE: B Natriuretic Peptide: 74.6 pg/mL (ref 0.0–100.0)

## 2016-05-02 LAB — MRSA PCR SCREENING: MRSA by PCR: NEGATIVE

## 2016-05-02 MED ORDER — HYDROMORPHONE HCL 2 MG/ML IJ SOLN
1.0000 mg | INTRAMUSCULAR | Status: DC | PRN
Start: 2016-05-02 — End: 2016-05-02

## 2016-05-02 MED ORDER — HYDROMORPHONE HCL 1 MG/ML IJ SOLN: 1.0000 mg | INTRAMUSCULAR | Status: DC | PRN

## 2016-05-02 MED ORDER — IOPAMIDOL (ISOVUE-370) INJECTION 76%
INTRAVENOUS | Status: AC
  Administered 2016-05-02: 100 mL
  Filled 2016-05-02: qty 100

## 2016-05-02 MED ORDER — ACETAMINOPHEN 325 MG PO TABS
650.0000 mg | ORAL_TABLET | Freq: Four times a day (QID) | ORAL | Status: DC | PRN
  Administered 2016-05-03: 650 mg via ORAL
  Filled 2016-05-02: qty 2

## 2016-05-02 MED ORDER — SODIUM CHLORIDE 0.9% FLUSH
3.0000 mL | Freq: Two times a day (BID) | INTRAVENOUS | Status: DC
  Administered 2016-05-02 (×2): 3 mL via INTRAVENOUS
  Administered 2016-05-03: 10 mL via INTRAVENOUS
  Administered 2016-05-04 – 2016-05-06 (×4): 3 mL via INTRAVENOUS

## 2016-05-02 MED ORDER — TAMSULOSIN HCL 0.4 MG PO CAPS
0.4000 mg | ORAL_CAPSULE | Freq: Every day | ORAL | Status: DC
  Administered 2016-05-02 – 2016-05-06 (×5): 0.4 mg via ORAL
  Filled 2016-05-02 (×6): qty 1

## 2016-05-02 MED ORDER — ACETAMINOPHEN 650 MG RE SUPP: 650.0000 mg | Freq: Four times a day (QID) | RECTAL | Status: DC | PRN

## 2016-05-02 MED ORDER — ONDANSETRON HCL 4 MG/2ML IJ SOLN
4.0000 mg | Freq: Four times a day (QID) | INTRAMUSCULAR | Status: DC | PRN
Start: 2016-05-02 — End: 2016-05-06

## 2016-05-02 MED ORDER — OXYCODONE HCL 5 MG PO TABS
5.0000 mg | ORAL_TABLET | ORAL | Status: DC | PRN
  Administered 2016-05-02 – 2016-05-03 (×2): 5 mg via ORAL
  Filled 2016-05-02 (×2): qty 1

## 2016-05-02 MED ORDER — ONDANSETRON HCL 4 MG PO TABS: 4.0000 mg | ORAL_TABLET | Freq: Four times a day (QID) | ORAL | Status: DC | PRN

## 2016-05-02 MED ORDER — MORPHINE SULFATE (PF) 4 MG/ML IV SOLN
4.0000 mg | Freq: Once | INTRAVENOUS | Status: AC
  Administered 2016-05-02: 4 mg via INTRAVENOUS
  Filled 2016-05-02: qty 1

## 2016-05-02 MED ORDER — ORAL CARE MOUTH RINSE
15.0000 mL | Freq: Two times a day (BID) | OROMUCOSAL | Status: DC
  Administered 2016-05-02 – 2016-05-06 (×8): 15 mL via OROMUCOSAL

## 2016-05-02 MED ORDER — HEPARIN (PORCINE) IN NACL 100-0.45 UNIT/ML-% IJ SOLN
1750.0000 [IU]/h | INTRAMUSCULAR | Status: DC
  Administered 2016-05-02 (×2): 1600 [IU]/h via INTRAVENOUS
  Administered 2016-05-03: 1750 [IU]/h via INTRAVENOUS
  Filled 2016-05-02 (×7): qty 250

## 2016-05-02 MED ORDER — DEXTROSE 5 % IV SOLN
1.0000 g | Freq: Once | INTRAVENOUS | Status: AC
  Administered 2016-05-02: 1 g via INTRAVENOUS
  Filled 2016-05-02: qty 10

## 2016-05-02 MED ORDER — HEPARIN BOLUS VIA INFUSION
6000.0000 [IU] | Freq: Once | INTRAVENOUS | Status: AC
  Administered 2016-05-02: 6000 [IU] via INTRAVENOUS
  Filled 2016-05-02: qty 6000

## 2016-05-02 MED ORDER — DEXTROSE 5 % IV SOLN
500.0000 mg | Freq: Once | INTRAVENOUS | Status: AC
  Administered 2016-05-02: 500 mg via INTRAVENOUS
  Filled 2016-05-02: qty 500

## 2016-05-02 NOTE — ED Notes (Signed)
Attempted to call report x2

## 2016-05-02 NOTE — Progress Notes (Signed)
ANTICOAGULATION CONSULT NOTE - Follow Up Consult  Pharmacy Consult for Heparin Indication: pulmonary embolus  No Known Allergies  Patient Measurements: Height: 6\' 2"  (188 cm) Weight: 245 lb 2.4 oz (111.2 kg) IBW/kg (Calculated) : 82.2 Heparin Dosing Weight: 105.3 kg  Vital Signs: Temp: 99 F (37.2 C) (12/11 1149) Temp Source: Oral (12/11 1149) BP: 150/90 (12/11 1100) Pulse Rate: 104 (12/11 1100)  Labs:  Recent Labs  05/01/16 2234 05/01/16 2303 05/01/16 2330 05/02/16 1006  HGB  --   --  14.8  --   HCT  --   --  40.7  --   PLT  --   --  185  --   HEPARINUNFRC  --   --   --  0.59  CREATININE 1.17  --   --   --   TROPONINI  --  0.03*  --   --     Estimated Creatinine Clearance: 84.6 mL/min (by C-G formula based on SCr of 1.17 mg/dL).  Assessment: 65 year old male on IV heparin for submassive bilateral pulmonary embolisms with R-heart strain.   Initial heparin level is therapeutic at 0.59 -drawn 6 hours after drip initiated. CBC is stable. No bleeding reported. Patient states he is doing well and aware of heparin therapy and need for levels.   Goal of Therapy:  Heparin level 0.3-0.7 units/ml Monitor platelets by anticoagulation protocol: Yes   Plan:  Continue heparin at 1600 units/hr. Repeat heparin level in 6 hours.  Daily heparin level and CBC while on therapy.  Monitor for signs and symptoms of bleeding.  Follow-up plan to transition to oral therapy.   Sloan Leiter, PharmD, BCPS Clinical Pharmacist [phone] 05/02/2016,12:02 PM

## 2016-05-02 NOTE — ED Notes (Signed)
Patient transported to CT 

## 2016-05-02 NOTE — Progress Notes (Signed)
ANTICOAGULATION CONSULT NOTE - Initial Consult  Pharmacy Consult for Heparin  Indication: pulmonary embolus  No Known Allergies  Patient Measurements: Height: 6\' 2"  (188 cm) Weight: 256 lb (116.1 kg) IBW/kg (Calculated) : 82.2  Vital Signs: Temp: 99.2 F (37.3 C) (12/10 2312) Temp Source: Oral (12/10 2312) BP: 116/87 (12/11 0315) Pulse Rate: 109 (12/11 0315)  Labs:  Recent Labs  05/01/16 2234 05/01/16 2303 05/01/16 2330  HGB  --   --  14.8  HCT  --   --  40.7  PLT  --   --  185  CREATININE 1.17  --   --   TROPONINI  --  0.03*  --     Estimated Creatinine Clearance: 86.4 mL/min (by C-G formula based on SCr of 1.17 mg/dL).   Medical History: Past Medical History:  Diagnosis Date  . Hepatitis    c  . Hypertension     Assessment: 65 y/o M presents to the ED after syncopal episode. LUQ pain x 2 weeks. D-Dimer is markedly elevated. CT Angio is + for PE. CBC good, renal function good. PTA meds reviewed.   Goal of Therapy:  Heparin level 0.3-0.7 units/ml Monitor platelets by anticoagulation protocol: Yes   Plan:  Heparin 6000 units BOLUS Start heparin drip at 1600 units/hr 1200 HL Daily CBC/HL Monitor for bleeding  Narda Bonds 05/02/2016,3:33 AM

## 2016-05-02 NOTE — ED Notes (Addendum)
Pt became diaphoretic and shaky with severe increased LUQ pain. BP at this time is found to be 78/59 with spo2 88% on RA, cbg 156. Dr. Roxanne Mins aware. NS bolus started, placed on 2lpm o2 , and pt reassessed at this time. A&O x 4. Rhonchi noted bilaterally. Pt requesting pain medication. I explained to him with his bp this low it is not safe at this time but Dr. Roxanne Mins is aware.

## 2016-05-02 NOTE — Progress Notes (Addendum)
PROGRESS NOTE  Wyatt Berry [id number] DOB: 05/29/1950 DOA: 05/01/2016 PCP: Wenda Low, MD  Brief History:  65 year old male with a history of hepatitis C status post treatment with Harvoni, hypertension, "prediabetes" presented with two-week history of upper abdominal pain and dyspnea on exertion. Approximately 2 weeks ago, the patient began experiencing sharp pleuritic left upper quadrant pain that subsequently migrated to the right upper quadrant. He did not have any fevers, chills, nausea, vomiting, diarrhea. He had a few days of some relief, but when his abdominal pain returned to the left upper quadrant it was worse than when it began. During the past week, he has had increasing dyspnea on exertion without any frank chest discomfort, dizziness, or syncope. On the evening of 05/01/2016, the patient was sitting up in his bed when he slumped over and was diaphoretic. EMS was activated. There was no prodromal symptoms. His loss of consciousness lasted less than 30 seconds. In the emergency department, the patient was initially in a dynamically stable, but had a sudden onset of left upper quadrant, left-sided chest discomfort. He became hypotensive and diaphoretic and subsequently had a near syncopal episode. CT angiogram of the chest revealed large bilateral pulmonary emboli involving the central pulmonary artery with large burden in the LLL pulmonary artery and near occlusion of the LLL branches. There was evidence of RV strain as well as LLL ground glass opacities suggesting pulmonary infarct.   Assessment/Plan:  Acute massive pulmonary emboli  -Unprovoked by clinical history  -PCCM consulted to see patient -continue heparin drip -05/01/16 CTA--shows evidence of RV strain and pulmonary infarct -venous duplex Lower extremities -now hemodynamically stable -factor V Leiden -Lupus anticoagulant -prothrombin gene mutation  Acute respiratory failure with hypoxia -secondary to  pulmonary embolus -stable on 2L presently -wean oxygen as able  Hypertension -Holding losartan/HCTZ in the setting of hypotension  Impaired glucose tolerance -Hemoglobin A1c -Not on any agents in the outpatient setting  Hyponatremia -likely due to losartan/HCTZ -am BMP  Leukocytosis -stress demargination -am CBC  Tobacco abuse -20 pack year hx -tobacco cessation discussed   Disposition Plan:   Home in 3-4 days  Family Communication:  No Family at bedside--Total time spent 31 minutes.  Greater than 50% spent face to face counseling and coordinating care.--[id number] to [phone]   Consultants:  PCCM  Code Status:  FULL   DVT Prophylaxis:  IV Heparin   Procedures: As Listed in Progress Note Above  Antibiotics: None    Subjective: Patient denies fevers, chills, headache, chest pain, dyspnea, nausea, vomiting, diarrhea, abdominal pain, dysuria, hematuria, hematochezia, and melena.   Objective: Vitals:   05/02/16 0530 05/02/16 0636 05/02/16 0700 05/02/16 0711  BP: 132/80 (!) 142/92 135/80   Pulse: 105 (!) 106 (!) 105   Resp:  (!) 25 (!) 25   Temp:    100 F (37.8 C)  TempSrc:    Oral  SpO2: 94% 94% 94%   Weight:  111.2 kg (245 lb 2.4 oz)    Height:  6\' 2"  (1.88 m)      Intake/Output Summary (Last 24 hours) at 05/02/16 0830 Last data filed at 05/02/16 0700  Gross per 24 hour  Intake              300 ml  Output              400 ml  Net             -100 ml  Weight change:  Exam:   General:  Pt is alert, follows commands appropriately, not in acute distress  HEENT: No icterus, No thrush, No neck mass, [place]/AT  Cardiovascular: RRR, S1/S2, no rubs, no gallops  Respiratory: Bibasilar crackles without wheezing. Good air movement.  Abdomen: Soft/+BS, non tender, non distended, no guarding  Extremities: 1+ LLE edema, No lymphangitis, No petechiae, No rashes, no synovitis   Data Reviewed: I have personally reviewed following labs and imaging studies Basic  Metabolic Panel:  Recent Labs Lab 05/01/16 2234  NA 132*  K 3.7  CL 100*  CO2 21*  GLUCOSE 162*  BUN 13  CREATININE 1.17  CALCIUM 8.7*   Liver Function Tests:  Recent Labs Lab 05/01/16 2303  AST 28  ALT 18  ALKPHOS 61  BILITOT 0.9  PROT 7.5  ALBUMIN 3.0*    Recent Labs Lab 05/01/16 2303  LIPASE 15   No results for input(s): AMMONIA in the last 168 hours. Coagulation Profile: No results for input(s): INR, PROTIME in the last 168 hours. CBC:  Recent Labs Lab 05/01/16 2330  WBC 14.0*  NEUTROABS 11.2*  HGB 14.8  HCT 40.7  MCV 86.4  PLT 185   Cardiac Enzymes:  Recent Labs Lab 05/01/16 2303  TROPONINI 0.03*   BNP: Invalid input(s): POCBNP CBG:  Recent Labs Lab 05/01/16 2240 05/02/16 0032  GLUCAP 176* 156*   HbA1C: No results for input(s): HGBA1C in the last 72 hours. Urine analysis:    Component Value Date/Time   COLORURINE YELLOW 05/01/2016 2234   [place] 05/01/2016 2234   LABSPEC 1.021 05/01/2016 2234   PHURINE 5.0 05/01/2016 2234   GLUCOSEU NEGATIVE 05/01/2016 2234   [place] 05/01/2016 2234   [place] 05/01/2016 2234   [place] 05/01/2016 2234   PROTEINUR 30 (A) 05/01/2016 2234   NITRITE NEGATIVE 05/01/2016 2234   LEUKOCYTESUR NEGATIVE 05/01/2016 2234   Sepsis Labs: @LABRCNTIP (procalcitonin:4,lacticidven:4) )No results found for this or any previous visit (from the past 240 hour(s)).   Scheduled Meds: . sodium chloride flush  3 mL Intravenous Q12H  . tamsulosin  0.4 mg Oral QPC supper   Continuous Infusions: . heparin 1,600 Units/hr (05/02/16 0351)    Procedures/Studies: Dg Chest 2 View  Result Date: 05/02/2016 CLINICAL DATA:  Productive cough and dyspnea for several days. Syncope at home. EXAM: CHEST  2 VIEW COMPARISON:  09/01/2013 lumbar spine radiographs which partially the lung bases FINDINGS: Heart is top-normal size with aortic atherosclerosis. Upper lobe predominant  hyperinflation is noted bilaterally. More confluent airspace opacity noted peripherally at the left lung base suspicious for pneumonia. Left apical scarring is suggested. No significant effusion or pneumothorax. IMPRESSION: Confluent airspace opacity at the left lung base laterally. Findings may reflect pneumonia given patient's symptoms. Ill-defined area of spiculation in the left upper lobe may reflect scarring. Followup PA and lateral chest X-ray is recommended in 3-4 weeks following trial of antibiotic therapy to ensure resolution and exclude underlying malignancy. Electronically Signed   By: Ashley Royalty M.D.   On: 05/02/2016 00:35   Ct Angio Chest Pe W And/or Wo Contrast  Result Date: 05/02/2016 CLINICAL DATA:  65 year old male with shortness of breath and chest pain. EXAM: CT ANGIOGRAPHY CHEST WITH CONTRAST TECHNIQUE: Multidetector CT imaging of the chest was performed using the standard protocol during bolus administration of intravenous contrast. Multiplanar CT image reconstructions and MIPs were obtained to evaluate the vascular anatomy. CONTRAST:  100 cc Isovue 370 COMPARISON:  Chest radiograph dated 05/01/2016 FINDINGS: Cardiovascular:  There are large bilateral pulmonary artery emboli extending from the central pulmonary artery is into the lobar, and segmental branches. The largest clot burden involves the left lower lobe pulmonary artery with near complete occlusion of the left lower lobe branches. There is mild dilatation of the main pulmonary trunk suggestive of associated pulmonary hypertension. There is enlargement of the right ventricle with flattening of the intraventricular septum compatible with right cardiac straining. The RV/LV ratio is 1.5. There is no pericardial effusion. Multi vessel coronary vascular calcifications primarily involving the LAD and left circumflex artery. There is atherosclerotic calcification of the thoracic aorta. No aneurysmal dilatation or evidence of dissection.  The origins of the great vessels of the aortic arch appear patent. Mediastinum/Nodes: There is no hilar or mediastinal adenopathy. The esophagus and the thyroid gland are grossly unremarkable. Lungs/Pleura: There is emphysematous changes of the lungs. Wedge-shaped area of pleural based ground-glass density involving the left lower lobe most compatible with pulmonary infarct. A 2.3 x 1.6 cm focal pleural based consolidative area in the left a packs noted which may represent scarring, an area of pulmonary infarct, and less likely infiltrate. Underlying mass is not excluded. Correlation with clinical exam and follow-up recommended. Subsegmental right lung base densities likely represent atelectatic changes/ scarring versus less likely infarct. There is a small left pleural effusion. No pneumothorax. The central airways are patent. Upper Abdomen: There is irregularity of the hepatic contour likely related to underlying cirrhosis. Musculoskeletal: No chest wall abnormality. No acute or significant osseous findings. Review of the MIP images confirms the above findings. IMPRESSION: Large bilateral pulmonary artery emboli involving the central pulmonary artery is with extension into the lobar and segmental branches. The largest clot burden involves the left lower lobe pulmonary arteries with associated focal left lower lobe pulmonary infarct. Positive for acute PE with CTevidence of right heart strain (RV/LV Ratio = 1.5) consistent with at least submassive (intermediate risk) PE. The presence of right heart strain has been associated with an increased risk of morbidity and mortality. Focal left apical consolidative changes may represent scarring, infarct, or less likely infiltrate. A pulmonary mass is not excluded. Follow-up recommended. Emphysema. These results were called by telephone at the time of interpretation on 05/02/2016 at 3:35 am to Nurse Felps, who verbally acknowledged these results. Electronically Signed   By:  Anner Crete M.D.   On: 05/02/2016 03:36   US Abdomen Limited Ruq  Result Date: 04/11/2016 CLINICAL DATA:  Hepatitis-C history.  Screening exam. EXAM: US ABDOMEN LIMITED - RIGHT UPPER QUADRANT COMPARISON:  Ultrasound 10/20/2015 . FINDINGS: Gallbladder: No gallstones noted.  5 mm gallbladder polyp noted. Common bile duct: Diameter: 3.6 mm Liver: No focal lesion identified. Within normal limits in parenchymal echogenicity. IMPRESSION: No acute abnormality.  5 mm gallbladder polyp noted. Electronically Signed   By: Marcello Moores  Register   On: 04/11/2016 12:59    Mikala Podoll, DO  [facility] Pager [phone]  If 7PM-7AM, please contact night-coverage [email or web address] Password [email or web address] 05/02/2016, 8:30 AM   LOS: 0 days

## 2016-05-02 NOTE — H&P (Signed)
History and Physical  Patient Name: Wyatt Berry     [id number]    DOB: 01/07/1951    DOA: 05/01/2016 PCP: Wenda Low, MD   Patient coming from: Home  Chief Complaint: Syncope  HPI: Wyatt Berry is a 65 y.o. male with a past medical history significant for hepatitis C s/p Harvoni and HTN who presents with syncope.  He was in his usual state of health until about 2 weeks ago when he started to feel vague malaise, tired, short of breath with exertion. He initially had some left upper quadrant abdominal pain, that seemed to migrate to the right, then come back to the left and stay there.  This was severe, sharp, worse with inspiration.  In today, he was sitting up in bed with his wife when he suddenly passed out. He doesn't remember this and had no prodrome, but she said he slumped over, was sweating profusely, tongue was hanging out, and that he recovered within a few seconds. His wife called 911 immediately and he was brought to the emergency room.  ED course: -Afebrile, heart rate 112, respirations 24, blood pressure 170/91, pulse oximetry 95% on 2L supplemental oxygen by nasal cannula -Na 132, K 3.7, Cr 1.17 (baseline unknown), WBC 14K, Hgb 14.8 -Troponin 0.03, Dimer > 20 -CTA chest was obtained that showed saddle PE, large clot burden in LLL with nearly occluded LL lobar arteries with evidence of pulmonary infarction on CT and RV/LV 1.5 -He was started on heparin gtt -While in the ER, he had a sudden pain in his left side, became diaphoretic again and weak, and his BP dropped to 78/59 -He was immediately given an NS bolus and his BP returned to 120/90 and remained stable over the next three hours and TRH were subsequently asked to evaluate for PE    No recent travel.  No recent surgery.  No recent weight loss.  Has a nodule on CTA chest that is suspected scarring, needs f/u.  No previous PE.       ROS: Review of Systems  Constitutional: Negative for chills and fever.    Respiratory: Positive for cough and shortness of breath. Negative for hemoptysis and sputum production.   Cardiovascular: Negative for chest pain and leg swelling.  Gastrointestinal: Positive for abdominal pain.  Neurological: Positive for loss of consciousness.  All other systems reviewed and are negative.         Past Medical History:  Diagnosis Date  . Hepatitis    c  . Hypertension     History reviewed. No pertinent surgical history.  Social History: Patient lives with his wife.  The patient walks unassisted.  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  Active smoker.    No Known Allergies  Family history: family history includes Congestive Heart Failure in his mother; Diabetes in his mother; Heart disease in his father.  Prior to Admission medications   Medication Sig Start Date End Date Taking? Authorizing Provider  HYDROcodone-acetaminophen (NORCO/VICODIN) 5-325 MG per tablet Take 1-2 tablets by mouth every 6 (six) hours as needed for moderate pain. 09/01/13   Drenda Freeze, MD  ibuprofen (ADVIL,MOTRIN) 200 MG tablet Take 200 mg by mouth every 6 (six) hours as needed for moderate pain.    Historical Provider, MD  losartan-hydrochlorothiazide (HYZAAR) 100-12.5 MG per tablet Take 1 tablet by mouth daily.    Historical Provider, MD  tamsulosin (FLOMAX) 0.4 MG CAPS capsule Take 0.4 mg by mouth daily  after supper.    Historical Provider, MD       Physical Exam: BP 123/80   Pulse 107   Temp 99.2 F (37.3 C) (Oral)   Resp 26   Ht 6\' 2"  (1.88 m)   Wt 116.1 kg (256 lb)   SpO2 93%   BMI 32.87 kg/m  General appearance: Well-developed, adult male, alert, tired appearing and in no acute distress.   Eyes: Anicteric, conjunctiva pink, lids and lashes normal. PERRL.    ENT: No nasal deformity, discharge, epistaxis.  Hearing normal. OP moist without lesions.   Neck: No neck masses.  Trachea midline.  No thyromegaly/tenderness. Lymph: No cervical or  supraclavicular lymphadenopathy. Skin: Warm and dry.  No jaundice.  No suspicious rashes or lesions. Cardiac: Tachycardic, regular, nl S1-S2, no murmurs appreciated.  Capillary refill is brisk.  No JVD.  No LE edema.  Radial and DP pulses 2+ and symmetric. Respiratory: Respiratory rate increased.  No rales. Abdomen: Abdomen soft.  No TTP. No ascites, distension, hepatosplenomegaly.   MSK: No deformities or effusions.  No cyanosis or clubbing. Neuro: Cranial nerves normal.  Sensation intact to light touch. Speech is fluent.  Muscle strength normal.    Psych: Sensorium intact and responding to questions, attention normal.  Behavior appropriate.  Affect normal.  Judgment and insight appear normal.     Labs on Admission:  I have personally reviewed following labs and imaging studies: CBC:  Recent Labs Lab 05/01/16 2330  WBC 14.0*  NEUTROABS 11.2*  HGB 14.8  HCT 40.7  MCV 86.4  PLT 123XX123   Basic Metabolic Panel:  Recent Labs Lab 05/01/16 2234  NA 132*  K 3.7  CL 100*  CO2 21*  GLUCOSE 162*  BUN 13  CREATININE 1.17  CALCIUM 8.7*   GFR: Estimated Creatinine Clearance: 86.4 mL/min (by C-G formula based on SCr of 1.17 mg/dL).  Liver Function Tests:  Recent Labs Lab 05/01/16 2303  AST 28  ALT 18  ALKPHOS 61  BILITOT 0.9  PROT 7.5  ALBUMIN 3.0*    Recent Labs Lab 05/01/16 2303  LIPASE 15   No results for input(s): AMMONIA in the last 168 hours. Coagulation Profile: No results for input(s): INR, PROTIME in the last 168 hours. Cardiac Enzymes:  Recent Labs Lab 05/01/16 2303  TROPONINI 0.03*   BNP (last 3 results) No results for input(s): PROBNP in the last 8760 hours. HbA1C: No results for input(s): HGBA1C in the last 72 hours. CBG:  Recent Labs Lab 05/01/16 2240 05/02/16 0032  GLUCAP 176* 156*   Lipid Profile: No results for input(s): CHOL, HDL, LDLCALC, TRIG, CHOLHDL, LDLDIRECT in the last 72 hours. Thyroid Function Tests: No results for  input(s): TSH, T4TOTAL, FREET4, T3FREE, THYROIDAB in the last 72 hours. Anemia Panel: No results for input(s): VITAMINB12, FOLATE, FERRITIN, TIBC, IRON, RETICCTPCT in the last 72 hours. Sepsis Labs: Invalid input(s): PROCALCITONIN, LACTICIDVEN No results found for this or any previous visit (from the past 240 hour(s)).       Radiological Exams on Admission: Personally reviewed CXR shows LL opacity: Dg Chest 2 View  Result Date: 05/02/2016 CLINICAL DATA:  Productive cough and dyspnea for several days. Syncope at home. EXAM: CHEST  2 VIEW COMPARISON:  09/01/2013 lumbar spine radiographs which partially the lung bases FINDINGS: Heart is top-normal size with aortic atherosclerosis. Upper lobe predominant hyperinflation is noted bilaterally. More confluent airspace opacity noted peripherally at the left lung base suspicious for pneumonia. Left apical scarring is suggested. No significant  effusion or pneumothorax. IMPRESSION: Confluent airspace opacity at the left lung base laterally. Findings may reflect pneumonia given patient's symptoms. Ill-defined area of spiculation in the left upper lobe may reflect scarring. Followup PA and lateral chest X-ray is recommended in 3-4 weeks following trial of antibiotic therapy to ensure resolution and exclude underlying malignancy. Electronically Signed   By: Ashley Royalty M.D.   On: 05/02/2016 00:35   Ct Angio Chest Pe W And/or Wo Contrast  Result Date: 05/02/2016 CLINICAL DATA:  65 year old male with shortness of breath and chest pain. EXAM: CT ANGIOGRAPHY CHEST WITH CONTRAST TECHNIQUE: Multidetector CT imaging of the chest was performed using the standard protocol during bolus administration of intravenous contrast. Multiplanar CT image reconstructions and MIPs were obtained to evaluate the vascular anatomy. CONTRAST:  100 cc Isovue 370 COMPARISON:  Chest radiograph dated 05/01/2016 FINDINGS: Cardiovascular: There are large bilateral pulmonary artery emboli  extending from the central pulmonary artery is into the lobar, and segmental branches. The largest clot burden involves the left lower lobe pulmonary artery with near complete occlusion of the left lower lobe branches. There is mild dilatation of the main pulmonary trunk suggestive of associated pulmonary hypertension. There is enlargement of the right ventricle with flattening of the intraventricular septum compatible with right cardiac straining. The RV/LV ratio is 1.5. There is no pericardial effusion. Multi vessel coronary vascular calcifications primarily involving the LAD and left circumflex artery. There is atherosclerotic calcification of the thoracic aorta. No aneurysmal dilatation or evidence of dissection. The origins of the great vessels of the aortic arch appear patent. Mediastinum/Nodes: There is no hilar or mediastinal adenopathy. The esophagus and the thyroid gland are grossly unremarkable. Lungs/Pleura: There is emphysematous changes of the lungs. Wedge-shaped area of pleural based ground-glass density involving the left lower lobe most compatible with pulmonary infarct. A 2.3 x 1.6 cm focal pleural based consolidative area in the left a packs noted which may represent scarring, an area of pulmonary infarct, and less likely infiltrate. Underlying mass is not excluded. Correlation with clinical exam and follow-up recommended. Subsegmental right lung base densities likely represent atelectatic changes/ scarring versus less likely infarct. There is a small left pleural effusion. No pneumothorax. The central airways are patent. Upper Abdomen: There is irregularity of the hepatic contour likely related to underlying cirrhosis. Musculoskeletal: No chest wall abnormality. No acute or significant osseous findings. Review of the MIP images confirms the above findings. IMPRESSION: Large bilateral pulmonary artery emboli involving the central pulmonary artery is with extension into the lobar and segmental  branches. The largest clot burden involves the left lower lobe pulmonary arteries with associated focal left lower lobe pulmonary infarct. Positive for acute PE with CTevidence of right heart strain (RV/LV Ratio = 1.5) consistent with at least submassive (intermediate risk) PE. The presence of right heart strain has been associated with an increased risk of morbidity and mortality. Focal left apical consolidative changes may represent scarring, infarct, or less likely infiltrate. A pulmonary mass is not excluded. Follow-up recommended. Emphysema. These results were called by telephone at the time of interpretation on 05/02/2016 at 3:35 am to Nurse Felps, who verbally acknowledged these results. Electronically Signed   By: Anner Crete M.D.   On: 05/02/2016 03:36    EKG: Independently reviewed. Rate 116, QTc 496, sinus tachycardia.       Assessment/Plan  1. PE:  Right heart strain evident on CT.  Clot burden large.  Patient syncopized twice now, second time here in ER, observed, was  transiently hypotensive, but now regained hemodynamic stability since.   -Heparin gtt -Echo ordered -PCCM consult, appreciate cares -Check doppler bilateral lower extremities -Little evidence to support immobility, but will keep bed rest for now -Acetaminophen, oxycodone or hydromorphone for pain   2. Hyponatremia:  -Repeat BMP tomorrow  3. HTN:  -Hold Losartan-HCTZ given ?hypotension earlier  4. Other medications:  -Continue tamsulosin  5. CXR opacities: These are lung infarcts.  Will hold Abx.     DVT prophylaxis: N/A  Code Status: FULL  Family Communication: None present  Disposition Plan: Anticipate stepdown admission, heparin, echocardiogram.  PCCM consult re: submassive PE.   Consults called: PCCM Admission status: INPATIENT, stepdown        Medical decision making: Patient seen at 4:00 AM on 05/02/2016.  The patient was discussed with Dr. Roxanne Mins and Dr. Lamonte Sakai.  What exists of the  patient's chart was reviewed in depth and summarized above.  Clinical condition: only briefly hemodynamically unstable since arriving.  Currently BP 0000000 systolic and stable, still somewhat tachycardic, respiratory status low 90s on 3L by [place].  If any further hemodynamic instability, will call in echo team tonight, discuss with PCCM again and d/w IR.        Edwin Dada [facility] Pager [phone]

## 2016-05-02 NOTE — ED Notes (Signed)
Attempted to call report x 1  

## 2016-05-02 NOTE — Progress Notes (Signed)
*  PRELIMINARY RESULTS* Vascular Ultrasound Bilateral lower extremity venous duplex has been completed.  Preliminary findings: No evidence of deep vein thrombosis in the right lower extremity.  The left lower extremity is positive for  acute deep vein thrombosis in the proximal left femoral vein extending throughout the extremity.  Negative for baker's cysts bilaterally. Preliminary results given to nurse at 13:00.   Everrett Coombe 05/02/2016, 1:01 PM

## 2016-05-02 NOTE — Consult Note (Signed)
Chief Complaint: PE  Referring Physician(s): Dr. Halford Chessman, [place]  Supervising Physician: Corrie Mckusick  Patient Status: [facility] - In-pt  History of Present Illness: Wyatt Berry is a 65 y.o. male hospitalized at [facility] in the ICU after presenting to the ED yesterday on Sunday December 10 for a syncopal episode at home, with discovery of bilateral pulmonary emboli, and LLE DVT.   He has been referred for evaluation of candidacy for catheter directed therapy with thrombolytics (CDT).   Wyatt Berry tells me that he does not remember the event, but his wife witnessed a syncopal event at home yesterday where he was unconscious/unresponsive for a few minutes.   He denies any associated SOB, dyspnea, or CP.  This has never happened previously.  He has no knowledge of a prior DVT or PE.  He came to the ED to be evaluated.    Since that time he has been comfortable without dyspnea or CP.  No recurrent syncope or pre-syncope.  He is currently treated with IV therapeutic heparin.   He has had no recent GI bleeding, surgery or stroke.   Vital signs: SBP range 80-90 to 140-150. (150 at the bedside)   HR:  99-115. (110 at the bedside).  O2 low 90's on [place].  (93% with 3LNC at the bedside).   His CT study designates a RV/LV ratio of 1.5.  There is bilateral lobar,segmental,subsegmental PE.   Single Troponin I lab from 05/01/2016 = >0.03 (+elevation). BNP normal range.   Simplified PESI score: +1 for tachycardia = high risk  PMH: Negative for known COPD.  Negative for prior MI.   Soc Hx: Married.  1 step daughter. Current smoker or 1/2 pack per day.  Worked as former custodial.  Lives in [place].     Past Medical History:  Diagnosis Date  . Hepatitis    c  . Hypertension     History reviewed. No pertinent surgical history.  Allergies: Patient has no known allergies.  Medications: Prior to Admission medications   Medication Sig Start Date End Date Taking? Authorizing  Provider  amLODipine (NORVASC) 5 MG tablet Take 5 mg by mouth daily. 02/01/16  Yes Historical Provider, MD  ibuprofen (ADVIL,MOTRIN) 200 MG tablet Take 200-400 mg by mouth every 6 (six) hours as needed (for pain).    Yes Historical Provider, MD  losartan-hydrochlorothiazide (HYZAAR) 100-12.5 MG per tablet Take 1 tablet by mouth daily.   Yes Historical Provider, MD  tamsulosin (FLOMAX) 0.4 MG CAPS capsule Take 0.4 mg by mouth daily after supper.   Yes Historical Provider, MD  HYDROcodone-acetaminophen (NORCO/VICODIN) 5-325 MG per tablet Take 1-2 tablets by mouth every 6 (six) hours as needed for moderate pain. Patient not taking: Reported on 05/02/2016 09/01/13   Drenda Freeze, MD     Family History  Problem Relation Age of Onset  . Diabetes Mother   . Congestive Heart Failure Mother   . Heart disease Father     Social History   Social History  . Marital status: Single    Spouse name: N/A  . Number of children: N/A  . Years of education: N/A   Social History Main Topics  . Smoking status: Current Every Day Smoker    Packs/day: 0.50    Types: Cigarettes  . Smokeless tobacco: Never Used  . Alcohol use No  . Drug use: No  . Sexual activity: Not Asked   Other Topics Concern  . None   Social History Narrative  .  None       Review of Systems: A 12 point ROS discussed and pertinent positives are indicated in the HPI above.  All other systems are negative.  Review of Systems  Vital Signs: BP 137/79   Pulse (!) 105   Temp 98.6 F (37 C) (Oral)   Resp 19   Ht 6\' 2"  (1.88 m)   Wt 245 lb 2.4 oz (111.2 kg)   SpO2 93%   BMI 31.48 kg/m   Physical Exam Atraumatic, Normocephalic.  Mucous membranes moist pink. No scleral icterus.  No injection. Conjugate gaze. No glasses. Moving all 4 extremities equally.  Gross motor and sensory intact.  Alert and oriented to person place time.  Symmetric excursion of chest on inspiration/expiration.  No labored breathing.  No tachypnea  at bedside. Left leg asymmetric warmth to the touch compared to right.  No sig swelling.  No wounds.  Mallampati Score:     Imaging: Dg Chest 2 View  Result Date: 05/02/2016 CLINICAL DATA:  Productive cough and dyspnea for several days. Syncope at home. EXAM: CHEST  2 VIEW COMPARISON:  09/01/2013 lumbar spine radiographs which partially the lung bases FINDINGS: Heart is top-normal size with aortic atherosclerosis. Upper lobe predominant hyperinflation is noted bilaterally. More confluent airspace opacity noted peripherally at the left lung base suspicious for pneumonia. Left apical scarring is suggested. No significant effusion or pneumothorax. IMPRESSION: Confluent airspace opacity at the left lung base laterally. Findings may reflect pneumonia given patient's symptoms. Ill-defined area of spiculation in the left upper lobe may reflect scarring. Followup PA and lateral chest X-ray is recommended in 3-4 weeks following trial of antibiotic therapy to ensure resolution and exclude underlying malignancy. Electronically Signed   By: Ashley Royalty M.D.   On: 05/02/2016 00:35   Ct Angio Chest Pe W And/or Wo Contrast  Result Date: 05/02/2016 CLINICAL DATA:  65 year old male with shortness of breath and chest pain. EXAM: CT ANGIOGRAPHY CHEST WITH CONTRAST TECHNIQUE: Multidetector CT imaging of the chest was performed using the standard protocol during bolus administration of intravenous contrast. Multiplanar CT image reconstructions and MIPs were obtained to evaluate the vascular anatomy. CONTRAST:  100 cc Isovue 370 COMPARISON:  Chest radiograph dated 05/01/2016 FINDINGS: Cardiovascular: There are large bilateral pulmonary artery emboli extending from the central pulmonary artery is into the lobar, and segmental branches. The largest clot burden involves the left lower lobe pulmonary artery with near complete occlusion of the left lower lobe branches. There is mild dilatation of the main pulmonary trunk  suggestive of associated pulmonary hypertension. There is enlargement of the right ventricle with flattening of the intraventricular septum compatible with right cardiac straining. The RV/LV ratio is 1.5. There is no pericardial effusion. Multi vessel coronary vascular calcifications primarily involving the LAD and left circumflex artery. There is atherosclerotic calcification of the thoracic aorta. No aneurysmal dilatation or evidence of dissection. The origins of the great vessels of the aortic arch appear patent. Mediastinum/Nodes: There is no hilar or mediastinal adenopathy. The esophagus and the thyroid gland are grossly unremarkable. Lungs/Pleura: There is emphysematous changes of the lungs. Wedge-shaped area of pleural based ground-glass density involving the left lower lobe most compatible with pulmonary infarct. A 2.3 x 1.6 cm focal pleural based consolidative area in the left a packs noted which may represent scarring, an area of pulmonary infarct, and less likely infiltrate. Underlying mass is not excluded. Correlation with clinical exam and follow-up recommended. Subsegmental right lung base densities likely represent atelectatic changes/ scarring  versus less likely infarct. There is a small left pleural effusion. No pneumothorax. The central airways are patent. Upper Abdomen: There is irregularity of the hepatic contour likely related to underlying cirrhosis. Musculoskeletal: No chest wall abnormality. No acute or significant osseous findings. Review of the MIP images confirms the above findings. IMPRESSION: Large bilateral pulmonary artery emboli involving the central pulmonary artery is with extension into the lobar and segmental branches. The largest clot burden involves the left lower lobe pulmonary arteries with associated focal left lower lobe pulmonary infarct. Positive for acute PE with CTevidence of right heart strain (RV/LV Ratio = 1.5) consistent with at least submassive (intermediate risk)  PE. The presence of right heart strain has been associated with an increased risk of morbidity and mortality. Focal left apical consolidative changes may represent scarring, infarct, or less likely infiltrate. A pulmonary mass is not excluded. Follow-up recommended. Emphysema. These results were called by telephone at the time of interpretation on 05/02/2016 at 3:35 am to Nurse Felps, who verbally acknowledged these results. Electronically Signed   By: Anner Crete M.D.   On: 05/02/2016 03:36   US Abdomen Limited Ruq  Result Date: 04/11/2016 CLINICAL DATA:  Hepatitis-C history.  Screening exam. EXAM: US ABDOMEN LIMITED - RIGHT UPPER QUADRANT COMPARISON:  Ultrasound 10/20/2015 . FINDINGS: Gallbladder: No gallstones noted.  5 mm gallbladder polyp noted. Common bile duct: Diameter: 3.6 mm Liver: No focal lesion identified. Within normal limits in parenchymal echogenicity. IMPRESSION: No acute abnormality.  5 mm gallbladder polyp noted. Electronically Signed   By: Marcello Moores  Register   On: 04/11/2016 12:59    Labs:  CBC:  Recent Labs  05/01/16 2330  WBC 14.0*  HGB 14.8  HCT 40.7  PLT 185    COAGS: No results for input(s): INR, APTT in the last 8760 hours.  BMP:  Recent Labs  05/01/16 2234  NA 132*  K 3.7  CL 100*  CO2 21*  GLUCOSE 162*  BUN 13  CALCIUM 8.7*  CREATININE 1.17  GFRNONAA >60  GFRAA >60    LIVER FUNCTION TESTS:  Recent Labs  05/01/16 2303  BILITOT 0.9  AST 28  ALT 18  ALKPHOS 61  PROT 7.5  ALBUMIN 3.0*    TUMOR MARKERS: No results for input(s): AFPTM, CEA, CA199, CHROMGRNA in the last 8760 hours.  Assessment and Plan:  Wyatt Berry is 64 yo male presenting to [facility] yesterday after syncope with bilateral PE, submassive category.    Currently, he is comfortable receiving IV heparin, with maintained SBP, though he has persisting tachycardia and O2 sat <95% on [place].    I discussed catheter directed therapy with introducing pulmonary arterial catheters and  drip tPA.  The risk benefit analysis was discussed, with specific risks including bleeding, infection, venous injury, need for further procedure/surgery, GI bleed, neurologic bleed, optic bleed (overall bleed risk of <4%), cardiopulmonary collapse, death.    I did explain that the benefit of CDT/tPA thrombolysis would be to relieve the right heart strain and improve function, for intermediate to long-term benefit.    Plan. Given his current status I think it is reasonable to observe for further improvement.  If he continues to have declining o2 or has any change in his vital signs, CDT with PA tPA thrombolysis may benefit.    VIR will continue to follow.   Call with questions/concerns.   Thank you for this interesting consult.  I greatly enjoyed meeting Wyatt Berry and look forward to participating in their care.  A copy of this report was sent to the requesting provider on this date.  Electronically Signed: Corrie Mckusick 05/02/2016, 5:19 PM   I spent a total of 52 Miinutes    in face to face in clinical consultation, greater than 50% of which was counseling/coordinating care for submassive PE, possible catheter directed thrombolysis.

## 2016-05-02 NOTE — Consult Note (Signed)
PULMONARY / CRITICAL CARE MEDICINE   Name: Wyatt Berry MRN: [id number] DOB: Mar 23, 1951    ADMISSION DATE:  05/01/2016 CONSULTATION DATE:  05/02/2016  REFERRING MD:  Dr. Carles Collet  CHIEF COMPLAINT:  Short of breath  HISTORY OF PRESENT ILLNESS:   65 yo male from home with syncope and diaphoresis.  He had Lt sided pleuritic chest pain prior to this.  He was brought to ER and CT angio chest showed PE with RV strain and changes suggestive of pulmonary infarct.  He was also found to have Lt leg DVT.  He denies prior history of clots, or family history of clots.  He denies recent travel or trauma.  No recent change in medications.  His breathing has improved since admission.  He denies chest pain when at rest.  He does still feel uneasy when he moves.  He continues to smoke cigarettes.  He was unaware about having emphysema or COPD.  He completed therapy for Hepatitis C.  He was told he still had some liver problems, but was unaware that he had cirrhosis.  PAST MEDICAL HISTORY :  He  has a past medical history of Hepatitis and Hypertension.  PAST SURGICAL HISTORY: He  has no past surgical history on file.  No Known Allergies  No current facility-administered medications on file prior to encounter.    Current Outpatient Prescriptions on File Prior to Encounter  Medication Sig  . ibuprofen (ADVIL,MOTRIN) 200 MG tablet Take 200-400 mg by mouth every 6 (six) hours as needed (for pain).   Marland Kitchen losartan-hydrochlorothiazide (HYZAAR) 100-12.5 MG per tablet Take 1 tablet by mouth daily.  . tamsulosin (FLOMAX) 0.4 MG CAPS capsule Take 0.4 mg by mouth daily after supper.  Marland Kitchen HYDROcodone-acetaminophen (NORCO/VICODIN) 5-325 MG per tablet Take 1-2 tablets by mouth every 6 (six) hours as needed for moderate pain. (Patient not taking: Reported on 05/02/2016)    FAMILY HISTORY:  His indicated that the status of his mother is unknown. He indicated that the status of his father is unknown.    SOCIAL  HISTORY: He  reports that he has been smoking Cigarettes.  He has been smoking about 0.50 packs per day. He has never used smokeless tobacco. He reports that he does not drink alcohol or use drugs.  REVIEW OF SYSTEMS:   Negative except above  SUBJECTIVE:   VITAL SIGNS: BP (!) 145/82   Pulse (!) 114   Temp 99 F (37.2 C) (Oral)   Resp (!) 32   Ht 6\' 2"  (1.88 m)   Wt 245 lb 2.4 oz (111.2 kg)   SpO2 91%   BMI 31.48 kg/m   INTAKE / OUTPUT: I/O last 3 completed shifts: In: 350.4 [I.V.:50.4; IV Piggyback:300] Out: 400 [Urine:400]  PHYSICAL EXAMINATION: General:  Alert, sitting in chair Neuro:  Normal strength, CN intact HEENT:  Pupils reactive, no jaundice, no stridor, no LAN Cardiovascular:  Regular, tachycardic Lungs:  No wheeze/rales Abdomen:  Soft, non tender Musculoskeletal:  Edema Lt lower leg Skin:  No rashes  LABS:  BMET  Recent Labs Lab 05/01/16 2234  NA 132*  K 3.7  CL 100*  CO2 21*  BUN 13  CREATININE 1.17  GLUCOSE 162*    Electrolytes  Recent Labs Lab 05/01/16 2234  CALCIUM 8.7*    CBC  Recent Labs Lab 05/01/16 2330  WBC 14.0*  HGB 14.8  HCT 40.7  PLT 185    Coag's No results for input(s): APTT, INR in the last 168 hours.  Sepsis  Markers No results for input(s): LATICACIDVEN, PROCALCITON, O2SATVEN in the last 168 hours.  ABG No results for input(s): PHART, PCO2ART, PO2ART in the last 168 hours.  Liver Enzymes  Recent Labs Lab 05/01/16 2303  AST 28  ALT 18  ALKPHOS 61  BILITOT 0.9  ALBUMIN 3.0*    Cardiac Enzymes  Recent Labs Lab 05/01/16 2303  TROPONINI 0.03*    Glucose  Recent Labs Lab 05/01/16 2240 05/02/16 0032 05/02/16 0625  GLUCAP 176* 156* 157*    Imaging Dg Chest 2 View  Result Date: 05/02/2016 CLINICAL DATA:  Productive cough and dyspnea for several days. Syncope at home. EXAM: CHEST  2 VIEW COMPARISON:  09/01/2013 lumbar spine radiographs which partially the lung bases FINDINGS: Heart is  top-normal size with aortic atherosclerosis. Upper lobe predominant hyperinflation is noted bilaterally. More confluent airspace opacity noted peripherally at the left lung base suspicious for pneumonia. Left apical scarring is suggested. No significant effusion or pneumothorax. IMPRESSION: Confluent airspace opacity at the left lung base laterally. Findings may reflect pneumonia given patient's symptoms. Ill-defined area of spiculation in the left upper lobe may reflect scarring. Followup PA and lateral chest X-ray is recommended in 3-4 weeks following trial of antibiotic therapy to ensure resolution and exclude underlying malignancy. Electronically Signed   By: Ashley Royalty M.D.   On: 05/02/2016 00:35   Ct Angio Chest Pe W And/or Wo Contrast  Result Date: 05/02/2016 CLINICAL DATA:  65 year old male with shortness of breath and chest pain. EXAM: CT ANGIOGRAPHY CHEST WITH CONTRAST TECHNIQUE: Multidetector CT imaging of the chest was performed using the standard protocol during bolus administration of intravenous contrast. Multiplanar CT image reconstructions and MIPs were obtained to evaluate the vascular anatomy. CONTRAST:  100 cc Isovue 370 COMPARISON:  Chest radiograph dated 05/01/2016 FINDINGS: Cardiovascular: There are large bilateral pulmonary artery emboli extending from the central pulmonary artery is into the lobar, and segmental branches. The largest clot burden involves the left lower lobe pulmonary artery with near complete occlusion of the left lower lobe branches. There is mild dilatation of the main pulmonary trunk suggestive of associated pulmonary hypertension. There is enlargement of the right ventricle with flattening of the intraventricular septum compatible with right cardiac straining. The RV/LV ratio is 1.5. There is no pericardial effusion. Multi vessel coronary vascular calcifications primarily involving the LAD and left circumflex artery. There is atherosclerotic calcification of the  thoracic aorta. No aneurysmal dilatation or evidence of dissection. The origins of the great vessels of the aortic arch appear patent. Mediastinum/Nodes: There is no hilar or mediastinal adenopathy. The esophagus and the thyroid gland are grossly unremarkable. Lungs/Pleura: There is emphysematous changes of the lungs. Wedge-shaped area of pleural based ground-glass density involving the left lower lobe most compatible with pulmonary infarct. A 2.3 x 1.6 cm focal pleural based consolidative area in the left a packs noted which may represent scarring, an area of pulmonary infarct, and less likely infiltrate. Underlying mass is not excluded. Correlation with clinical exam and follow-up recommended. Subsegmental right lung base densities likely represent atelectatic changes/ scarring versus less likely infarct. There is a small left pleural effusion. No pneumothorax. The central airways are patent. Upper Abdomen: There is irregularity of the hepatic contour likely related to underlying cirrhosis. Musculoskeletal: No chest wall abnormality. No acute or significant osseous findings. Review of the MIP images confirms the above findings. IMPRESSION: Large bilateral pulmonary artery emboli involving the central pulmonary artery is with extension into the lobar and segmental branches. The largest  clot burden involves the left lower lobe pulmonary arteries with associated focal left lower lobe pulmonary infarct. Positive for acute PE with CTevidence of right heart strain (RV/LV Ratio = 1.5) consistent with at least submassive (intermediate risk) PE. The presence of right heart strain has been associated with an increased risk of morbidity and mortality. Focal left apical consolidative changes may represent scarring, infarct, or less likely infiltrate. A pulmonary mass is not excluded. Follow-up recommended. Emphysema. These results were called by telephone at the time of interpretation on 05/02/2016 at 3:35 am to Nurse Felps,  who verbally acknowledged these results. Electronically Signed   By: Anner Crete M.D.   On: 05/02/2016 03:36     STUDIES:  CT angio chest 12/10 >> large b/l PE, RV:LV ratio 1.5, emphysema, pulmonary infarct LLL, changes of cirrhosis  Echo 12/11 >> mod LVH, EF 65 to 70%, +McConnell's sign, mod TR, PAS 57 mmHg Doppler legs b/l 12/11 >> acute Lt femoral vein DVT extending distally  SIGNIFICANT EVENTS: 12/10 Admit, start heparin gtt  LINES/TUBES:  DISCUSSION: 65 yo male smoker with syncope from acute PE with RV strain, Lt leg DVT.  He has hx of Hep C s/p Harvoni, and changes of cirrhosis on upper abdomen imaging.  He would be an acceptable candidate for catheter directed thrombolytics, but hx of cirrhosis would put him at higher risk of bleeding from thrombolytic therapy.  He is agreeable to discuss catheter directed thrombolysis further with IR before determining whether he would be willing to proceed.  ASSESSMENT / PLAN:  Acute hypoxic respiratory failure. Acute PE with RV strain, Lt leg DVT. Emphysema with hx of tobacco abuse. Acute on chronic pulmonary HTN. - continue heparin gtt for now - IR consulted to assess for catheter directed thrombolytics - oxygen to keep SpO2 > 92% - will need PFTs as outpt - smoking cessation  Hx of hepatitis C with cirrhosis. - per primary team  Chesley Mires, MD [facility] Pulmonary/Critical Care 05/02/2016, 3:54 PM Pager:  [phone] After 3pm call: [phone]

## 2016-05-02 NOTE — Care Management Note (Signed)
Case Management Note  Patient Details  Name: Wyatt Berry MRN: [id number] Date of Birth: 28-Jul-1950  Subjective/Objective:   Pt admitted with large bilateral PE with right heart strain                  Action/Plan:  PTA from home with wife.  CM will continue to follow for discharge needs   Expected Discharge Date:                  Expected Discharge Plan:  Home/Self Care  In-House Referral:     Discharge planning Services  CM Consult  Post Acute Care Choice:    Choice offered to:     DME Arranged:    DME Agency:     HH Arranged:    HH Agency:     Status of Service:  In process, will continue to follow  If discussed at Long Length of Stay Meetings, dates discussed:    Additional Comments:  Maryclare Labrador, RN 05/02/2016, 8:22 AM

## 2016-05-02 NOTE — Progress Notes (Signed)
  Echocardiogram 2D Echocardiogram has been performed.  Donata Clay 05/02/2016, 12:23 PM

## 2016-05-02 NOTE — Progress Notes (Signed)
ANTICOAGULATION CONSULT NOTE - Follow Up Consult  Pharmacy Consult for Heparin Indication: pulmonary embolus  No Known Allergies  Patient Measurements: Height: 6\' 2"  (188 cm) Weight: 245 lb 2.4 oz (111.2 kg) IBW/kg (Calculated) : 82.2 Heparin Dosing Weight: 105.3kg  Vital Signs: Temp: 98.6 F (37 C) (12/11 1524) Temp Source: Oral (12/11 1524) BP: 137/79 (12/11 1700) Pulse Rate: 105 (12/11 1700)  Labs:  Recent Labs  05/01/16 2234 05/01/16 2303 05/01/16 2330 05/02/16 1006 05/02/16 1628  HGB  --   --  14.8  --   --   HCT  --   --  40.7  --   --   PLT  --   --  185  --   --   HEPARINUNFRC  --   --   --  0.59 0.46  CREATININE 1.17  --   --   --   --   TROPONINI  --  0.03*  --   --   --     Estimated Creatinine Clearance: 84.6 mL/min (by C-G formula based on SCr of 1.17 mg/dL).   Medications:  Heparin @ 1600 units/hr  Assessment: 65yom continues on heparin for submassive bilateral pulmonary emboli with R-heart strain. Confirmatory heparin level is therapeutic at 0.46. No bleeding.  Goal of Therapy:  Heparin level 0.3-0.7 units/ml Monitor platelets by anticoagulation protocol: Yes   Plan:  1) Continue heparin at 1600 units/hr 2) Daily heparin level and CBC  Deboraha Sprang 05/02/2016,6:01 PM

## 2016-05-03 LAB — PROTIME-INR
INR: 1.08
PROTHROMBIN TIME: 14 s (ref 11.4–15.2)

## 2016-05-03 LAB — BASIC METABOLIC PANEL
ANION GAP: 11 (ref 5–15)
BUN: 8 mg/dL (ref 6–20)
CALCIUM: 8.5 mg/dL — AB (ref 8.9–10.3)
CO2: 21 mmol/L — ABNORMAL LOW (ref 22–32)
CREATININE: 0.97 mg/dL (ref 0.61–1.24)
Chloride: 102 mmol/L (ref 101–111)
Glucose, Bld: 119 mg/dL — ABNORMAL HIGH (ref 65–99)
Potassium: 3.9 mmol/L (ref 3.5–5.1)
Sodium: 134 mmol/L — ABNORMAL LOW (ref 135–145)

## 2016-05-03 LAB — HEPARIN LEVEL (UNFRACTIONATED)
HEPARIN UNFRACTIONATED: 0.38 [IU]/mL (ref 0.30–0.70)
Heparin Unfractionated: 0.27 IU/mL — ABNORMAL LOW (ref 0.30–0.70)

## 2016-05-03 LAB — HEMOGLOBIN A1C
HEMOGLOBIN A1C: 6.2 % — AB (ref 4.8–5.6)
MEAN PLASMA GLUCOSE: 131 mg/dL

## 2016-05-03 LAB — CBC
HCT: 38.5 % — ABNORMAL LOW (ref 39.0–52.0)
Hemoglobin: 13.1 g/dL (ref 13.0–17.0)
MCH: 30 pg (ref 26.0–34.0)
MCHC: 34 g/dL (ref 30.0–36.0)
MCV: 88.1 fL (ref 78.0–100.0)
PLATELETS: 204 10*3/uL (ref 150–400)
RBC: 4.37 MIL/uL (ref 4.22–5.81)
RDW: 14.4 % (ref 11.5–15.5)
WBC: 12.8 10*3/uL — AB (ref 4.0–10.5)

## 2016-05-03 LAB — MRSA PCR SCREENING: MRSA by PCR: NEGATIVE

## 2016-05-03 MED ORDER — HEPARIN (PORCINE) IN NACL 100-0.45 UNIT/ML-% IJ SOLN
1800.0000 [IU]/h | INTRAMUSCULAR | Status: AC
  Administered 2016-05-03 – 2016-05-05 (×4): 1800 [IU]/h via INTRAVENOUS
  Filled 2016-05-03 (×4): qty 250

## 2016-05-03 MED ORDER — AMLODIPINE BESYLATE 5 MG PO TABS
2.5000 mg | ORAL_TABLET | Freq: Every day | ORAL | Status: DC
  Administered 2016-05-03 – 2016-05-06 (×4): 2.5 mg via ORAL
  Filled 2016-05-03 (×4): qty 1

## 2016-05-03 NOTE — Progress Notes (Signed)
PROGRESS NOTE  Wyatt Berry [id number] DOB: Aug 25, 1950 DOA: 05/01/2016 PCP: Wenda Low, MD  Brief History:  65 year old male with a history of hepatitis C status post treatment with Harvoni, hypertension, "prediabetes" presented with two-week history of upper abdominal pain and dyspnea on exertion. Approximately 2 weeks ago, the patient began experiencing sharp pleuritic left upper quadrant pain that subsequently migrated to the right upper quadrant. He did not have any fevers, chills, nausea, vomiting, diarrhea. He had a few days of some relief, but when his abdominal pain returned to the left upper quadrant it was worse than when it began. During the past week, he has had increasing dyspnea on exertion without any frank chest discomfort, dizziness, or syncope. On the evening of 05/01/2016, the patient was sitting up in his bed when he slumped over and was diaphoretic. EMS was activated. There was no prodromal symptoms. His loss of consciousness lasted less than 30 seconds. In the emergency department, the patient was initially in a dynamically stable, but had a sudden onset of left upper quadrant, left-sided chest discomfort. He became hypotensive and diaphoretic and subsequently had a near syncopal episode. CT angiogram of the chest revealed large bilateral pulmonary emboli involving the central pulmonary artery with large burden in the LLL pulmonary artery and near occlusion of the LLL branches. There was evidence of RV strain as well as LLL ground glass opacities suggesting pulmonary infarct.   Assessment/Plan:  Acute massive pulmonary emboli//DVT Left femoral vein -Unprovoked by clinical history  -PCCM consulted-->IR for possible thrombolysis-->observe clinically for now as pt is slowly improving -heparin drip at least 72 hours (today is D#2) -pt interested in [place] -05/01/16 CTA--shows evidence of RV strain and pulmonary infarct -venous duplex Lower extremities-->+DVT Left  femoral vein -now hemodynamically stable -factor V Leiden -Lupus anticoagulant -prothrombin gene mutation  Acute respiratory failure with hypoxia -secondary to pulmonary embolus -stable on 2L presently -wean oxygen as able  Hypertension -Holding losartan/HCTZ in the setting of hypotension -restart low dose amlodipine 2.5 mg (5 mg at home)  Impaired glucose tolerance -Hemoglobin A1c--6.2 -Not on any agents in the outpatient setting  Hyponatremia -likely due to losartan/HCTZ -am BMP  Leukocytosis -stress demargination -am CBC-->improving -remain off antibiotics  Tobacco abuse -20 pack year hx -tobacco cessation discussed   Disposition Plan:   Home in 1-2 days  Family Communication:  No Family at bedside   Consultants:  PCCM  Code Status:  FULL   DVT Prophylaxis:  IV Heparin   Procedures: As Listed in Progress Note Above  Antibiotics: None    Subjective: Still having right sharp pleuritic chest pain.  A little better than yesterday.  Patient denies fevers, chills, headache,  nausea, vomiting, diarrhea, abdominal pain, dysuria, hematuria, hematochezia, and melena.  Still with dyspnea on exertion   Objective: Vitals:   05/03/16 1123 05/03/16 1200 05/03/16 1300 05/03/16 1400  BP:  135/80 140/81 (!) 142/75  Pulse:  98 100 (!) 109  Resp:  (!) 24 20 18   Temp: 99.1 F (37.3 C)     TempSrc: Oral     SpO2:  92% 96% 95%  Weight:      Height:        Intake/Output Summary (Last 24 hours) at 05/03/16 1620 Last data filed at 05/03/16 1400  Gross per 24 hour  Intake           361.55 ml  Output  1000 ml  Net          -638.45 ml   Weight change:  Exam:   General:  Pt is alert, follows commands appropriately, not in acute distress  HEENT: No icterus, No thrush, No neck mass, [place]/AT  Cardiovascular: RRR, S1/S2, no rubs, no gallops  Respiratory: bibasilar rales, no wheeze  Abdomen: Soft/+BS, non tender, non distended, no  guarding  Extremities: L>R LE edema, No lymphangitis, No petechiae, No rashes, no synovitis   Data Reviewed: I have personally reviewed following labs and imaging studies Basic Metabolic Panel:  Recent Labs Lab 05/01/16 2234 05/03/16 0224  NA 132* 134*  K 3.7 3.9  CL 100* 102  CO2 21* 21*  GLUCOSE 162* 119*  BUN 13 8  CREATININE 1.17 0.97  CALCIUM 8.7* 8.5*   Liver Function Tests:  Recent Labs Lab 05/01/16 2303  AST 28  ALT 18  ALKPHOS 61  BILITOT 0.9  PROT 7.5  ALBUMIN 3.0*    Recent Labs Lab 05/01/16 2303  LIPASE 15   No results for input(s): AMMONIA in the last 168 hours. Coagulation Profile:  Recent Labs Lab 05/03/16 1004  INR 1.08   CBC:  Recent Labs Lab 05/01/16 2330 05/03/16 0224  WBC 14.0* 12.8*  NEUTROABS 11.2*  --   HGB 14.8 13.1  HCT 40.7 38.5*  MCV 86.4 88.1  PLT 185 204   Cardiac Enzymes:  Recent Labs Lab 05/01/16 2303  TROPONINI 0.03*   BNP: Invalid input(s): POCBNP CBG:  Recent Labs Lab 05/01/16 2240 05/02/16 0032 05/02/16 0625  GLUCAP 176* 156* 157*   HbA1C:  Recent Labs  05/02/16 1006  HGBA1C 6.2*   Urine analysis:    Component Value Date/Time   COLORURINE YELLOW 05/01/2016 [place] 05/01/2016 2234   LABSPEC 1.021 05/01/2016 2234   PHURINE 5.0 05/01/2016 2234   GLUCOSEU NEGATIVE 05/01/2016 2234   [place] 05/01/2016 2234   [place] 05/01/2016 2234   [place] 05/01/2016 2234   PROTEINUR 30 (A) 05/01/2016 2234   NITRITE NEGATIVE 05/01/2016 2234   LEUKOCYTESUR NEGATIVE 05/01/2016 2234   Sepsis Labs: @LABRCNTIP (procalcitonin:4,lacticidven:4) ) Recent Results (from the past 240 hour(s))  MRSA PCR Screening     Status: None   Collection Time: 05/02/16  6:32 AM  Result Value Ref Range Status   MRSA by PCR NEGATIVE NEGATIVE Final    Comment:        The GeneXpert MRSA Assay (FDA approved for NASAL specimens only), is one component of a comprehensive  MRSA colonization surveillance program. It is not intended to diagnose MRSA infection nor to guide or monitor treatment for MRSA infections.      Scheduled Meds: . mouth rinse  15 mL Mouth Rinse BID  . sodium chloride flush  3 mL Intravenous Q12H  . tamsulosin  0.4 mg Oral QPC supper   Continuous Infusions: . heparin 1,800 Units/hr (05/03/16 1321)    Procedures/Studies: Dg Chest 2 View  Result Date: 05/02/2016 CLINICAL DATA:  Productive cough and dyspnea for several days. Syncope at home. EXAM: CHEST  2 VIEW COMPARISON:  09/01/2013 lumbar spine radiographs which partially the lung bases FINDINGS: Heart is top-normal size with aortic atherosclerosis. Upper lobe predominant hyperinflation is noted bilaterally. More confluent airspace opacity noted peripherally at the left lung base suspicious for pneumonia. Left apical scarring is suggested. No significant effusion or pneumothorax. IMPRESSION: Confluent airspace opacity at the left lung base laterally. Findings may reflect pneumonia given patient's symptoms. Ill-defined area  of spiculation in the left upper lobe may reflect scarring. Followup PA and lateral chest X-ray is recommended in 3-4 weeks following trial of antibiotic therapy to ensure resolution and exclude underlying malignancy. Electronically Signed   By: Ashley Royalty M.D.   On: 05/02/2016 00:35   Ct Angio Chest Pe W And/or Wo Contrast  Result Date: 05/02/2016 CLINICAL DATA:  65 year old male with shortness of breath and chest pain. EXAM: CT ANGIOGRAPHY CHEST WITH CONTRAST TECHNIQUE: Multidetector CT imaging of the chest was performed using the standard protocol during bolus administration of intravenous contrast. Multiplanar CT image reconstructions and MIPs were obtained to evaluate the vascular anatomy. CONTRAST:  100 cc Isovue 370 COMPARISON:  Chest radiograph dated 05/01/2016 FINDINGS: Cardiovascular: There are large bilateral pulmonary artery emboli extending from the central  pulmonary artery is into the lobar, and segmental branches. The largest clot burden involves the left lower lobe pulmonary artery with near complete occlusion of the left lower lobe branches. There is mild dilatation of the main pulmonary trunk suggestive of associated pulmonary hypertension. There is enlargement of the right ventricle with flattening of the intraventricular septum compatible with right cardiac straining. The RV/LV ratio is 1.5. There is no pericardial effusion. Multi vessel coronary vascular calcifications primarily involving the LAD and left circumflex artery. There is atherosclerotic calcification of the thoracic aorta. No aneurysmal dilatation or evidence of dissection. The origins of the great vessels of the aortic arch appear patent. Mediastinum/Nodes: There is no hilar or mediastinal adenopathy. The esophagus and the thyroid gland are grossly unremarkable. Lungs/Pleura: There is emphysematous changes of the lungs. Wedge-shaped area of pleural based ground-glass density involving the left lower lobe most compatible with pulmonary infarct. A 2.3 x 1.6 cm focal pleural based consolidative area in the left a packs noted which may represent scarring, an area of pulmonary infarct, and less likely infiltrate. Underlying mass is not excluded. Correlation with clinical exam and follow-up recommended. Subsegmental right lung base densities likely represent atelectatic changes/ scarring versus less likely infarct. There is a small left pleural effusion. No pneumothorax. The central airways are patent. Upper Abdomen: There is irregularity of the hepatic contour likely related to underlying cirrhosis. Musculoskeletal: No chest wall abnormality. No acute or significant osseous findings. Review of the MIP images confirms the above findings. IMPRESSION: Large bilateral pulmonary artery emboli involving the central pulmonary artery is with extension into the lobar and segmental branches. The largest clot  burden involves the left lower lobe pulmonary arteries with associated focal left lower lobe pulmonary infarct. Positive for acute PE with CTevidence of right heart strain (RV/LV Ratio = 1.5) consistent with at least submassive (intermediate risk) PE. The presence of right heart strain has been associated with an increased risk of morbidity and mortality. Focal left apical consolidative changes may represent scarring, infarct, or less likely infiltrate. A pulmonary mass is not excluded. Follow-up recommended. Emphysema. These results were called by telephone at the time of interpretation on 05/02/2016 at 3:35 am to Nurse Felps, who verbally acknowledged these results. Electronically Signed   By: Anner Crete M.D.   On: 05/02/2016 03:36   US Abdomen Limited Ruq  Result Date: 04/11/2016 CLINICAL DATA:  Hepatitis-C history.  Screening exam. EXAM: US ABDOMEN LIMITED - RIGHT UPPER QUADRANT COMPARISON:  Ultrasound 10/20/2015 . FINDINGS: Gallbladder: No gallstones noted.  5 mm gallbladder polyp noted. Common bile duct: Diameter: 3.6 mm Liver: No focal lesion identified. Within normal limits in parenchymal echogenicity. IMPRESSION: No acute abnormality.  5 mm gallbladder  polyp noted. Electronically Signed   By: Marcello Moores  Register   On: 04/11/2016 12:59    Luka Reisch, DO  [facility] Pager [phone]  If 7PM-7AM, please contact night-coverage [email or web address] Password [email or web address] 05/03/2016, 4:20 PM   LOS: 1 day

## 2016-05-03 NOTE — Progress Notes (Signed)
ANTICOAGULATION CONSULT NOTE   Pharmacy Consult for Heparin  Indication: pulmonary embolus  No Known Allergies  Patient Measurements: Height: 6\' 2"  (188 cm) Weight: 245 lb 2.4 oz (111.2 kg) IBW/kg (Calculated) : 82.2  Vital Signs: Temp: 99.5 F (37.5 C) (12/12 0000) Temp Source: Oral (12/12 0000) BP: 159/92 (12/12 0000) Pulse Rate: 112 (12/12 0000)  Labs:  Recent Labs  05/01/16 2234 05/01/16 2303 05/01/16 2330 05/02/16 1006 05/02/16 1628 05/03/16 0224  HGB  --   --  14.8  --   --  13.1  HCT  --   --  40.7  --   --  38.5*  PLT  --   --  185  --   --  204  HEPARINUNFRC  --   --   --  0.59 0.46 0.27*  CREATININE 1.17  --   --   --   --  0.97  TROPONINI  --  0.03*  --   --   --   --     Estimated Creatinine Clearance: 102.1 mL/min (by C-G formula based on SCr of 0.97 mg/dL).   Medical History: Past Medical History:  Diagnosis Date  . Hepatitis    c  . Hypertension     Assessment: 65 y/o M presents to the ED after syncopal episode. LUQ pain x 2 weeks. D-Dimer is markedly elevated. CT Angio is + for PE. CBC good, renal function good. Heparin level is low this AM. No issues per RN.   Goal of Therapy:  Heparin level 0.3-0.7 units/ml Monitor platelets by anticoagulation protocol: Yes   Plan:  Inc heparin drip to 1750 units/hr 1200 HL Daily CBC/HL Monitor for bleeding  Narda Bonds 05/03/2016,3:44 AM

## 2016-05-03 NOTE — Progress Notes (Signed)
Chief Complaint: Patient was seen in consultation today for  Chief Complaint  Patient presents with  . Loss of Consciousness   at the request of * No referring provider recorded for this case *    History of Present Illness: Wyatt Berry is a 65 y.o. male who was admitted with bilateral pulmonary embolism. He was treated conservatively with heparin. Yesterday, he had pleuritic chest pain and difficulty breathing. His Troponin was .03, EKG neg, RV/LV 1.5, PAS 57 mm Hg by echo. Today, he feels much better and has no symptoms. He has been up once without difficulty. He also has LLE DVT with swelling.  Past Medical History:  Diagnosis Date  . Hepatitis    c  . Hypertension     History reviewed. No pertinent surgical history.  Allergies: Patient has no known allergies.  Medications: Prior to Admission medications   Medication Sig Start Date End Date Taking? Authorizing Provider  amLODipine (NORVASC) 5 MG tablet Take 5 mg by mouth daily. 02/01/16  Yes Historical Provider, MD  ibuprofen (ADVIL,MOTRIN) 200 MG tablet Take 200-400 mg by mouth every 6 (six) hours as needed (for pain).    Yes Historical Provider, MD  losartan-hydrochlorothiazide (HYZAAR) 100-12.5 MG per tablet Take 1 tablet by mouth daily.   Yes Historical Provider, MD  tamsulosin (FLOMAX) 0.4 MG CAPS capsule Take 0.4 mg by mouth daily after supper.   Yes Historical Provider, MD  HYDROcodone-acetaminophen (NORCO/VICODIN) 5-325 MG per tablet Take 1-2 tablets by mouth every 6 (six) hours as needed for moderate pain. Patient not taking: Reported on 05/02/2016 09/01/13   Drenda Freeze, MD     Family History  Problem Relation Age of Onset  . Diabetes Mother   . Congestive Heart Failure Mother   . Heart disease Father     Social History   Social History  . Marital status: Single    Spouse name: N/A  . Number of children: N/A  . Years of education: N/A   Social History Main Topics  . Smoking status:  Current Every Day Smoker    Packs/day: 0.50    Types: Cigarettes  . Smokeless tobacco: Never Used  . Alcohol use No  . Drug use: No  . Sexual activity: Not Asked   Other Topics Concern  . None   Social History Narrative  . None      Review of Systems: A 12 point ROS discussed and pertinent positives are indicated in the HPI above.  All other systems are negative.  Review of Systems  Vital Signs: BP (!) 142/86   Pulse (!) 104   Temp 99.4 F (37.4 C) (Oral)   Resp 20   Ht 6\' 2"  (1.88 m)   Wt 245 lb 2.4 oz (111.2 kg)   SpO2 95%   BMI 31.48 kg/m    O2 [place] 3L  Physical Exam  Mallampati Score:     Imaging: Dg Chest 2 View  Result Date: 05/02/2016 CLINICAL DATA:  Productive cough and dyspnea for several days. Syncope at home. EXAM: CHEST  2 VIEW COMPARISON:  09/01/2013 lumbar spine radiographs which partially the lung bases FINDINGS: Heart is top-normal size with aortic atherosclerosis. Upper lobe predominant hyperinflation is noted bilaterally. More confluent airspace opacity noted peripherally at the left lung base suspicious for pneumonia. Left apical scarring is suggested. No significant effusion or pneumothorax. IMPRESSION: Confluent airspace opacity at the left lung base laterally. Findings may reflect pneumonia given patient's symptoms. Ill-defined area of spiculation  in the left upper lobe may reflect scarring. Followup PA and lateral chest X-ray is recommended in 3-4 weeks following trial of antibiotic therapy to ensure resolution and exclude underlying malignancy. Electronically Signed   By: Ashley Royalty M.D.   On: 05/02/2016 00:35   Ct Angio Chest Pe W And/or Wo Contrast  Result Date: 05/02/2016 CLINICAL DATA:  65 year old male with shortness of breath and chest pain. EXAM: CT ANGIOGRAPHY CHEST WITH CONTRAST TECHNIQUE: Multidetector CT imaging of the chest was performed using the standard protocol during bolus administration of intravenous contrast. Multiplanar CT  image reconstructions and MIPs were obtained to evaluate the vascular anatomy. CONTRAST:  100 cc Isovue 370 COMPARISON:  Chest radiograph dated 05/01/2016 FINDINGS: Cardiovascular: There are large bilateral pulmonary artery emboli extending from the central pulmonary artery is into the lobar, and segmental branches. The largest clot burden involves the left lower lobe pulmonary artery with near complete occlusion of the left lower lobe branches. There is mild dilatation of the main pulmonary trunk suggestive of associated pulmonary hypertension. There is enlargement of the right ventricle with flattening of the intraventricular septum compatible with right cardiac straining. The RV/LV ratio is 1.5. There is no pericardial effusion. Multi vessel coronary vascular calcifications primarily involving the LAD and left circumflex artery. There is atherosclerotic calcification of the thoracic aorta. No aneurysmal dilatation or evidence of dissection. The origins of the great vessels of the aortic arch appear patent. Mediastinum/Nodes: There is no hilar or mediastinal adenopathy. The esophagus and the thyroid gland are grossly unremarkable. Lungs/Pleura: There is emphysematous changes of the lungs. Wedge-shaped area of pleural based ground-glass density involving the left lower lobe most compatible with pulmonary infarct. A 2.3 x 1.6 cm focal pleural based consolidative area in the left a packs noted which may represent scarring, an area of pulmonary infarct, and less likely infiltrate. Underlying mass is not excluded. Correlation with clinical exam and follow-up recommended. Subsegmental right lung base densities likely represent atelectatic changes/ scarring versus less likely infarct. There is a small left pleural effusion. No pneumothorax. The central airways are patent. Upper Abdomen: There is irregularity of the hepatic contour likely related to underlying cirrhosis. Musculoskeletal: No chest wall abnormality. No  acute or significant osseous findings. Review of the MIP images confirms the above findings. IMPRESSION: Large bilateral pulmonary artery emboli involving the central pulmonary artery is with extension into the lobar and segmental branches. The largest clot burden involves the left lower lobe pulmonary arteries with associated focal left lower lobe pulmonary infarct. Positive for acute PE with CTevidence of right heart strain (RV/LV Ratio = 1.5) consistent with at least submassive (intermediate risk) PE. The presence of right heart strain has been associated with an increased risk of morbidity and mortality. Focal left apical consolidative changes may represent scarring, infarct, or less likely infiltrate. A pulmonary mass is not excluded. Follow-up recommended. Emphysema. These results were called by telephone at the time of interpretation on 05/02/2016 at 3:35 am to Nurse Felps, who verbally acknowledged these results. Electronically Signed   By: Anner Crete M.D.   On: 05/02/2016 03:36   US Abdomen Limited Ruq  Result Date: 04/11/2016 CLINICAL DATA:  Hepatitis-C history.  Screening exam. EXAM: US ABDOMEN LIMITED - RIGHT UPPER QUADRANT COMPARISON:  Ultrasound 10/20/2015 . FINDINGS: Gallbladder: No gallstones noted.  5 mm gallbladder polyp noted. Common bile duct: Diameter: 3.6 mm Liver: No focal lesion identified. Within normal limits in parenchymal echogenicity. IMPRESSION: No acute abnormality.  5 mm gallbladder polyp noted.  Electronically Signed   By: Marcello Moores  Register   On: 04/11/2016 12:59    Labs:  CBC:  Recent Labs  05/01/16 2330 05/03/16 0224  WBC 14.0* 12.8*  HGB 14.8 13.1  HCT 40.7 38.5*  PLT 185 204    COAGS: No results for input(s): INR, APTT in the last 8760 hours.  BMP:  Recent Labs  05/01/16 2234 05/03/16 0224  NA 132* 134*  K 3.7 3.9  CL 100* 102  CO2 21* 21*  GLUCOSE 162* 119*  BUN 13 8  CALCIUM 8.7* 8.5*  CREATININE 1.17 0.97  GFRNONAA >60 >60  GFRAA >60  >60    LIVER FUNCTION TESTS:  Recent Labs  05/01/16 2303  BILITOT 0.9  AST 28  ALT 18  ALKPHOS 61  PROT 7.5  ALBUMIN 3.0*    TUMOR MARKERS: No results for input(s): AFPTM, CEA, CA199, CHROMGRNA in the last 8760 hours.  Assessment and Plan:  He is improving clinically with anticoagulants. I would hold off on PE lysis for now. LLE DVT can be addressed after he is cleared form a cardiovascular status. If LLE swelling continues, DVT lysis can be considered.    Electronically Signed: Glennie Rodda, ART A 05/03/2016, 10:58 AM   I spent a total of   15 Minutes in face to face in clinical consultation, greater than 50% of which was counseling/coordinating care for PE.  Patient ID: JAKOB [id number], male   DOB: 1951/01/09, 65 y.o.   MRN: [id number]

## 2016-05-03 NOTE — Progress Notes (Signed)
ANTICOAGULATION CONSULT NOTE - Follow Up Consult  Pharmacy Consult:  Heparin Indication: pulmonary embolus  No Known Allergies  Patient Measurements: Height: 6\' 2"  (188 cm) Weight: 245 lb 2.4 oz (111.2 kg) IBW/kg (Calculated) : 82.2 Heparin Dosing Weight: 105 kg  Vital Signs: Temp: 99.1 F (37.3 C) (12/12 1123) Temp Source: Oral (12/12 1123) BP: 152/85 (12/12 1100) Pulse Rate: 108 (12/12 1100)  Labs:  Recent Labs  05/01/16 2234 05/01/16 2303 05/01/16 2330  05/02/16 1628 05/03/16 0224 05/03/16 1004 05/03/16 1126  HGB  --   --  14.8  --   --  13.1  --   --   HCT  --   --  40.7  --   --  38.5*  --   --   PLT  --   --  185  --   --  204  --   --   LABPROT  --   --   --   --   --   --  14.0  --   INR  --   --   --   --   --   --  1.08  --   HEPARINUNFRC  --   --   --   < > 0.46 0.27*  --  0.38  CREATININE 1.17  --   --   --   --  0.97  --   --   TROPONINI  --  0.03*  --   --   --   --   --   --   < > = values in this interval not displayed.  Estimated Creatinine Clearance: 102.1 mL/min (by C-G formula based on SCr of 0.97 mg/dL).     Assessment: 4 YOM with new bilateral PEs and LLE DVT.  Heparin level is therapeutic post adjustment this AM.  No bleeding reported.   Goal of Therapy:  Heparin level 0.3-0.7 units/ml Monitor platelets by anticoagulation protocol: Yes    Plan:  - Increase heparin gtt slightly to 1800 units/hr - Daily heparin level and CBC - F/U with possible DVT lysis    Dionicia Cerritos D. Mina Marble, PharmD, BCPS Pager:  [phone] 05/03/2016, 1:08 PM

## 2016-05-04 DIAGNOSIS — I10 Essential (primary) hypertension: Secondary | ICD-10-CM

## 2016-05-04 DIAGNOSIS — I824Y2 Acute embolism and thrombosis of unspecified deep veins of left proximal lower extremity: Secondary | ICD-10-CM

## 2016-05-04 LAB — HEPARIN LEVEL (UNFRACTIONATED): HEPARIN UNFRACTIONATED: 0.47 [IU]/mL (ref 0.30–0.70)

## 2016-05-04 LAB — CBC
HCT: 37.3 % — ABNORMAL LOW (ref 39.0–52.0)
Hemoglobin: 13 g/dL (ref 13.0–17.0)
MCH: 30.3 pg (ref 26.0–34.0)
MCHC: 34.9 g/dL (ref 30.0–36.0)
MCV: 86.9 fL (ref 78.0–100.0)
PLATELETS: 242 10*3/uL (ref 150–400)
RBC: 4.29 MIL/uL (ref 4.22–5.81)
RDW: 14.1 % (ref 11.5–15.5)
WBC: 10.8 10*3/uL — AB (ref 4.0–10.5)

## 2016-05-04 LAB — LUPUS ANTICOAGULANT PANEL
DRVVT: 41.8 s (ref 0.0–47.0)
PTT Lupus Anticoagulant: 40.7 s (ref 0.0–51.9)

## 2016-05-04 MED ORDER — RIVAROXABAN 15 MG PO TABS
15.0000 mg | ORAL_TABLET | Freq: Two times a day (BID) | ORAL | Status: DC
  Administered 2016-05-05 – 2016-05-06 (×4): 15 mg via ORAL
  Filled 2016-05-04 (×5): qty 1

## 2016-05-04 MED ORDER — RIVAROXABAN 20 MG PO TABS: 20.0000 mg | ORAL_TABLET | Freq: Every day | ORAL | Status: DC

## 2016-05-04 MED ORDER — HYDROCHLOROTHIAZIDE 12.5 MG PO CAPS
12.5000 mg | ORAL_CAPSULE | Freq: Every day | ORAL | Status: DC
  Administered 2016-05-04 – 2016-05-06 (×3): 12.5 mg via ORAL
  Filled 2016-05-04 (×3): qty 1

## 2016-05-04 MED ORDER — LOSARTAN POTASSIUM 50 MG PO TABS
100.0000 mg | ORAL_TABLET | Freq: Every day | ORAL | Status: DC
  Administered 2016-05-04 – 2016-05-06 (×3): 100 mg via ORAL
  Filled 2016-05-04 (×3): qty 2

## 2016-05-04 NOTE — Progress Notes (Addendum)
ANTICOAGULATION CONSULT NOTE - Follow Up Consult  Pharmacy Consult:  Heparin Indication: pulmonary embolus  No Known Allergies  Patient Measurements: Height: 6\' 2"  (188 cm) Weight: 245 lb 2.4 oz (111.2 kg) IBW/kg (Calculated) : 82.2 Heparin Dosing Weight: 105 kg  Vital Signs: Temp: 98.9 F (37.2 C) (12/13 0751) Temp Source: Oral (12/13 0751) BP: 136/85 (12/13 0751) Pulse Rate: 95 (12/13 0800)  Labs:  Recent Labs  05/01/16 2234 05/01/16 2303  05/01/16 2330  05/03/16 0224 05/03/16 1004 05/03/16 1126 05/04/16 0237  HGB  --   --   < > 14.8  --  13.1  --   --  13.0  HCT  --   --   --  40.7  --  38.5*  --   --  37.3*  PLT  --   --   --  185  --  204  --   --  242  LABPROT  --   --   --   --   --   --  14.0  --   --   INR  --   --   --   --   --   --  1.08  --   --   HEPARINUNFRC  --   --   --   --   < > 0.27*  --  0.38 0.47  CREATININE 1.17  --   --   --   --  0.97  --   --   --   TROPONINI  --  0.03*  --   --   --   --   --   --   --   < > = values in this interval not displayed.  Estimated Creatinine Clearance: 102.1 mL/min (by C-G formula based on SCr of 0.97 mg/dL).     Assessment: 84 YOM with new bilateral PEs and LLE DVT.  Heparin level is therapeutic today. Plans noted to begin a DOAC soon.   Goal of Therapy:  Heparin level 0.3-0.7 units/ml Monitor platelets by anticoagulation protocol: Yes    Plan:  -No heparin changes needed -Daily heparin level and CBC -Will follow timing for oral anticoagulation  Hildred Laser, Pharm D 05/04/2016 9:39 AM  Addendum -spoke with Dr. Quincy Simmonds and Xarelto to begin on 05/05/16  Plan -Discontinue heparin on 12/14 -Xarelto 15mg  po bid for 21 days then begin 20mg  po daily  Hildred Laser, Pharm D 05/04/2016 10:54 AM

## 2016-05-04 NOTE — Care Management Note (Addendum)
Case Management Note  Patient Details  Name: Wyatt Berry MRN: [id number] Date of Birth: 13-Aug-1950  Subjective/Objective:        Adm w pul embolus            Action/Plan: lives w fam   Expected Discharge Date:                  Expected Discharge Plan:  Home/Self Care  In-House Referral:     Discharge planning Services  CM Consult, Medication Assistance  Post Acute Care Choice:    Choice offered to:     DME Arranged:    DME Agency:     HH Arranged:    HH Agency:     Status of Service:  In process, will continue to follow  If discussed at Long Length of Stay Meetings, dates discussed:    Additional Comments: gave pt 30day free and copay cards for both xarelto and eliquis depending on which md orders. bcbs ins.S/W STEPHANIE @ [facility] # [phone]   1. XARELTO 20 MG PO DAILY 30/30 TAB   COVER- YES  CO-PAY- $ 47.00  TIER- 2 DRUG  PRIOR APPROVAL- NO   2. ELIQUIS 5 MG PO BID 30 /60TAB   COVER- YES  CO-PAY- $ 47.0  TIER- 2 DRUG  PRIOR APPROVAL- NO   PHARM[facility]Y : [facility]   Lacretia Leigh, RN 05/04/2016, 11:24 AM

## 2016-05-04 NOTE — Consult Note (Signed)
PULMONARY / CRITICAL CARE MEDICINE   Name: Wyatt Berry MRN: [id number] DOB: 02-Jul-1950    ADMISSION DATE:  05/01/2016 CONSULTATION DATE:  05/02/2016  REFERRING MD:  Dr. Carles Collet  CHIEF COMPLAINT:  Short of breath  HISTORY OF PRESENT ILLNESS:   65 yo male from home with syncope and diaphoresis.  He had Lt sided pleuritic chest pain prior to this.  He was brought to ER and CT angio chest showed PE with RV strain and changes suggestive of pulmonary infarct.  He was also found to have Lt leg DVT.  He denies prior history of clots, or family history of clots.  He denies recent travel or trauma.  No recent change in medications.  His breathing has improved since admission.  He denies chest pain when at rest.  He does still feel uneasy when he moves.  He continues to smoke cigarettes.  He was unaware about having emphysema or COPD.  He completed therapy for Hepatitis C.  He was told he still had some liver problems, but was unaware that he had cirrhosis.  SUBJECTIVE: No events overnight, feels much better this AM.  VITAL SIGNS: BP 136/85 (BP Location: Left Arm)   Pulse 95   Temp 98.9 F (37.2 C) (Oral)   Resp 20   Ht 6\' 2"  (1.88 m)   Wt 111.2 kg (245 lb 2.4 oz)   SpO2 97%   BMI 31.48 kg/m   INTAKE / OUTPUT: I/O last 3 completed shifts: In: 721.6 [P.O.:120; I.V.:601.6] Out: 1340 [Urine:1340]  PHYSICAL EXAMINATION: General:  Alert, sitting up in bed Neuro:  Normal strength, CN intact HEENT:  Pupils reactive, no jaundice, no stridor, no LAN Cardiovascular:  Regular, tachycardic Lungs:  No wheeze/rales, CTA bilaterally. Abdomen:  Soft, non tender Musculoskeletal:  Edema Lt lower leg Skin:  No rashes  LABS:  BMET  Recent Labs Lab 05/01/16 2234 05/03/16 0224  NA 132* 134*  K 3.7 3.9  CL 100* 102  CO2 21* 21*  BUN 13 8  CREATININE 1.17 0.97  GLUCOSE 162* 119*   Electrolytes  Recent Labs Lab 05/01/16 2234 05/03/16 0224  CALCIUM 8.7* 8.5*   CBC  Recent  Labs Lab 05/01/16 2330 05/03/16 0224 05/04/16 0237  WBC 14.0* 12.8* 10.8*  HGB 14.8 13.1 13.0  HCT 40.7 38.5* 37.3*  PLT 185 204 242   Coag's  Recent Labs Lab 05/03/16 1004  INR 1.08   Sepsis Markers No results for input(s): LATICACIDVEN, PROCALCITON, O2SATVEN in the last 168 hours.  ABG No results for input(s): PHART, PCO2ART, PO2ART in the last 168 hours.  Liver Enzymes  Recent Labs Lab 05/01/16 2303  AST 28  ALT 18  ALKPHOS 61  BILITOT 0.9  ALBUMIN 3.0*   Cardiac Enzymes  Recent Labs Lab 05/01/16 2303  TROPONINI 0.03*   Glucose  Recent Labs Lab 05/01/16 2240 05/02/16 0032 05/02/16 0625  GLUCAP 176* 156* 157*   Imaging No results found.  STUDIES:  CT angio chest 12/10 >> large b/l PE, RV:LV ratio 1.5, emphysema, pulmonary infarct LLL, changes of cirrhosis  Echo 12/11 >> mod LVH, EF 65 to 70%, +McConnell's sign, mod TR, PAS 57 mmHg Doppler legs b/l 12/11 >> acute Lt femoral vein DVT extending distally  SIGNIFICANT EVENTS: 12/10 Admit, start heparin gtt  LINES/TUBES:  I reviewed chest CT myself, PEs noted.  DISCUSSION: 65 yo male smoker with syncope from acute PE with RV strain, Lt leg DVT.  He has hx of Hep C s/p Harvoni, and  changes of cirrhosis on upper abdomen imaging.  Patient was improving, seen by IR and decision was made that catheter directed lysis is not necessary at this time.  ASSESSMENT / PLAN:  Acute hypoxic respiratory failure. Acute PE with RV strain, Lt leg DVT. Emphysema with hx of tobacco abuse. Acute on chronic pulmonary HTN. - Continue heparin gtt for now - Recommend starting NOAC or coumadin PO soon pending patient's preference. - IR consulted, no lytics, appreciate input. - Oxygen to keep SpO2 > 92%, currently at 2L. - Will need PFTs as outpt - Smoking cessation  Hx of hepatitis C with cirrhosis. - per primary team  Discussed with PCCM-NP.  PCCM will sign off, please call back if needed.  Rush Farmer,  M.D. [facility] Pulmonary/Critical Care Medicine. Pager: [phone]. After hours pager: [phone].

## 2016-05-04 NOTE — Progress Notes (Signed)
Referring Physician(s): Sood,V  Supervising Physician: Daryll Brod  Patient Status:  [facility] - In-pt  Chief Complaint: Bilateral pulmonary embolism, left leg DVT   Subjective: Pt doing ok today; transferred out of ICU yesterday; denies worsening dyspnea, cough or sig chest pain; anxious to get OOB; left leg without sig swelling or pain ; on IV heparin   Allergies: Patient has no known allergies.  Medications: Prior to Admission medications   Medication Sig Start Date End Date Taking? Authorizing Provider  amLODipine (NORVASC) 5 MG tablet Take 5 mg by mouth daily. 02/01/16  Yes Historical Provider, MD  ibuprofen (ADVIL,MOTRIN) 200 MG tablet Take 200-400 mg by mouth every 6 (six) hours as needed (for pain).    Yes Historical Provider, MD  losartan-hydrochlorothiazide (HYZAAR) 100-12.5 MG per tablet Take 1 tablet by mouth daily.   Yes Historical Provider, MD  tamsulosin (FLOMAX) 0.4 MG CAPS capsule Take 0.4 mg by mouth daily after supper.   Yes Historical Provider, MD  HYDROcodone-acetaminophen (NORCO/VICODIN) 5-325 MG per tablet Take 1-2 tablets by mouth every 6 (six) hours as needed for moderate pain. Patient not taking: Reported on 05/02/2016 09/01/13   Drenda Freeze, MD     Vital Signs: BP 136/85 (BP Location: Left Arm)   Pulse 95   Temp 98.9 F (37.2 C) (Oral)   Resp 20   Ht 6\' 2"  (1.88 m)   Wt 245 lb 2.4 oz (111.2 kg)   SpO2 97%   BMI 31.48 kg/m   Physical Exam awake/alert; heart- mildly tachy/reg; LLE without sig edema/pain  Imaging: Dg Chest 2 View  Result Date: 05/02/2016 CLINICAL DATA:  Productive cough and dyspnea for several days. Syncope at home. EXAM: CHEST  2 VIEW COMPARISON:  09/01/2013 lumbar spine radiographs which partially the lung bases FINDINGS: Heart is top-normal size with aortic atherosclerosis. Upper lobe predominant hyperinflation is noted bilaterally. More confluent airspace opacity noted peripherally at the left lung base suspicious for  pneumonia. Left apical scarring is suggested. No significant effusion or pneumothorax. IMPRESSION: Confluent airspace opacity at the left lung base laterally. Findings may reflect pneumonia given patient's symptoms. Ill-defined area of spiculation in the left upper lobe may reflect scarring. Followup PA and lateral chest X-ray is recommended in 3-4 weeks following trial of antibiotic therapy to ensure resolution and exclude underlying malignancy. Electronically Signed   By: Ashley Royalty M.D.   On: 05/02/2016 00:35   Ct Angio Chest Pe W And/or Wo Contrast  Result Date: 05/02/2016 CLINICAL DATA:  65 year old male with shortness of breath and chest pain. EXAM: CT ANGIOGRAPHY CHEST WITH CONTRAST TECHNIQUE: Multidetector CT imaging of the chest was performed using the standard protocol during bolus administration of intravenous contrast. Multiplanar CT image reconstructions and MIPs were obtained to evaluate the vascular anatomy. CONTRAST:  100 cc Isovue 370 COMPARISON:  Chest radiograph dated 05/01/2016 FINDINGS: Cardiovascular: There are large bilateral pulmonary artery emboli extending from the central pulmonary artery is into the lobar, and segmental branches. The largest clot burden involves the left lower lobe pulmonary artery with near complete occlusion of the left lower lobe branches. There is mild dilatation of the main pulmonary trunk suggestive of associated pulmonary hypertension. There is enlargement of the right ventricle with flattening of the intraventricular septum compatible with right cardiac straining. The RV/LV ratio is 1.5. There is no pericardial effusion. Multi vessel coronary vascular calcifications primarily involving the LAD and left circumflex artery. There is atherosclerotic calcification of the thoracic aorta. No aneurysmal dilatation or  evidence of dissection. The origins of the great vessels of the aortic arch appear patent. Mediastinum/Nodes: There is no hilar or mediastinal  adenopathy. The esophagus and the thyroid gland are grossly unremarkable. Lungs/Pleura: There is emphysematous changes of the lungs. Wedge-shaped area of pleural based ground-glass density involving the left lower lobe most compatible with pulmonary infarct. A 2.3 x 1.6 cm focal pleural based consolidative area in the left a packs noted which may represent scarring, an area of pulmonary infarct, and less likely infiltrate. Underlying mass is not excluded. Correlation with clinical exam and follow-up recommended. Subsegmental right lung base densities likely represent atelectatic changes/ scarring versus less likely infarct. There is a small left pleural effusion. No pneumothorax. The central airways are patent. Upper Abdomen: There is irregularity of the hepatic contour likely related to underlying cirrhosis. Musculoskeletal: No chest wall abnormality. No acute or significant osseous findings. Review of the MIP images confirms the above findings. IMPRESSION: Large bilateral pulmonary artery emboli involving the central pulmonary artery is with extension into the lobar and segmental branches. The largest clot burden involves the left lower lobe pulmonary arteries with associated focal left lower lobe pulmonary infarct. Positive for acute PE with CTevidence of right heart strain (RV/LV Ratio = 1.5) consistent with at least submassive (intermediate risk) PE. The presence of right heart strain has been associated with an increased risk of morbidity and mortality. Focal left apical consolidative changes may represent scarring, infarct, or less likely infiltrate. A pulmonary mass is not excluded. Follow-up recommended. Emphysema. These results were called by telephone at the time of interpretation on 05/02/2016 at 3:35 am to Nurse Felps, who verbally acknowledged these results. Electronically Signed   By: Anner Crete M.D.   On: 05/02/2016 03:36    Labs:  CBC:  Recent Labs  05/01/16 2330 05/03/16 0224  05/04/16 0237  WBC 14.0* 12.8* 10.8*  HGB 14.8 13.1 13.0  HCT 40.7 38.5* 37.3*  PLT 185 204 242    COAGS:  Recent Labs  05/03/16 1004  INR 1.08    BMP:  Recent Labs  05/01/16 2234 05/03/16 0224  NA 132* 134*  K 3.7 3.9  CL 100* 102  CO2 21* 21*  GLUCOSE 162* 119*  BUN 13 8  CALCIUM 8.7* 8.5*  CREATININE 1.17 0.97  GFRNONAA >60 >60  GFRAA >60 >60    LIVER FUNCTION TESTS:  Recent Labs  05/01/16 2303  BILITOT 0.9  AST 28  ALT 18  ALKPHOS 61  PROT 7.5  ALBUMIN 3.0*    Assessment and Plan: Pt with recent bilat PE/LLE DVT; currently stable on IV heparin; O2 sats 94-95% 2l N/C ; [id number]), hgb 13.0, plts 242k; hypercoag panel pending; cont current tx   Electronically Signed: D. Rowe Robert 05/04/2016, 9:20 AM   I spent a total of 15 minutes at the the patient's bedside AND on the patient's hospital floor or unit, greater than 50% of which was counseling/coordinating care for bilateral pulmonary emboli/left leg DVT    Patient ID: Wyatt Berry, male   DOB: [birth year], 65 y.o.   MRN: [id number]

## 2016-05-04 NOTE — Discharge Instructions (Signed)
Information on my medicine - XARELTO (rivaroxaban)  This medication education was reviewed with me or my healthcare representative as part of my discharge preparation.  The pharmacist that spoke with me during my hospital stay was:  Dareen Piano, [place]? Xarelto was prescribed to treat blood clots that may have been found in the veins of your legs (deep vein thrombosis) or in your lungs (pulmonary embolism) and to reduce the risk of them occurring again.  What do you need to know about Xarelto? The starting dose is one 15 mg tablet taken TWICE daily with food for the FIRST 21 DAYS then on 05/27/2015 the dose is changed to one 20 mg tablet taken ONCE A DAY with your evening meal.  DO NOT stop taking Xarelto without talking to the health care provider who prescribed the medication.  Refill your prescription for 20 mg tablets before you run out.  After discharge, you should have regular check-up appointments with your healthcare provider that is prescribing your Xarelto.  In the future your dose may need to be changed if your kidney function changes by a significant amount.  What do you do if you miss a dose? If you are taking Xarelto TWICE DAILY and you miss a dose, take it as soon as you remember. You may take two 15 mg tablets (total 30 mg) at the same time then resume your regularly scheduled 15 mg twice daily the next day.  If you are taking Xarelto ONCE DAILY and you miss a dose, take it as soon as you remember on the same day then continue your regularly scheduled once daily regimen the next day. Do not take two doses of Xarelto at the same time.   Important Safety Information Xarelto is a blood thinner medicine that can cause bleeding. You should call your healthcare provider right away if you experience any of the following: ? Bleeding from an injury or your nose that does not stop. ? Unusual colored urine (red or dark brown) or unusual  colored stools (red or black). ? Unusual bruising for unknown reasons. ? A serious fall or if you hit your head (even if there is no bleeding).  Some medicines may interact with Xarelto and might increase your risk of bleeding while on Xarelto. To help avoid this, consult your healthcare provider or pharmacist prior to using any new prescription or non-prescription medications, including herbals, vitamins, non-steroidal anti-inflammatory drugs (NSAIDs) and supplements.  This website has more information on Xarelto: [email or web address]

## 2016-05-04 NOTE — Progress Notes (Signed)
PROGRESS NOTE [facility]   Wyatt Berry   [id number] DOB: [birth year]  DOA: 05/01/2016 PCP: Wenda Low, MD   Brief Narrative:  65 year old male with a history of hepatitis C status post treatment with Harvoni, hypertension, "prediabetes" presented with two-week history of upper abdominal pain and dyspnea on exertion. Approximately 2 weeks ago, the patient began experiencing sharp pleuritic left upper quadrant pain that subsequently migrated to the right upper quadrant. He did not have any fevers, chills, nausea, vomiting, diarrhea. He had a few days of some relief, but when his abdominal pain returned to the left upper quadrant it was worse than when it began. During the past week, he has had increasing dyspnea on exertion without any frank chest discomfort, dizziness, or syncope. On the evening of 05/01/2016, the patient was sitting up in his bed when he slumped over and was diaphoretic. EMS was activated. There was no prodromal symptoms. His loss of consciousness lastedless than 30 seconds. In the emergency department, the patient was initially in a dynamically stable, but had a sudden onset of left upper quadrant, left-sided chest discomfort. He became hypotensive and diaphoretic and subsequently had a near syncopal episode. CT angiogram of the chest revealed large bilateral pulmonary emboli involving the central pulmonary artery with large burden in the LLLpulmonary artery and near occlusion of the LLLbranches. There was evidence of RV strain as well as LLLground glass opacities suggesting pulmonary infarct.   Subjective: Patient seen and examined. Patient c/o increase in cough this am. Denies chest pain and SOB, on 3L [place] No other complaints.  Assessment & Plan: Acute large pulmonary embolism/DVT left femoral vein, with RV strain IR consult appreciated - no thrombolysis at this time Continue heparin ggt for now   Will transition to NOAC in the AM  Will need A/C long term    PT eval  OOB as tolerated  Coag w/u so far negative   Acute respiratory failure with hypoxia Secondary to PE  O2 supplement keep O2 > 92%   HTN  Will resume home meds given BP rising  Monitor BP   Tobacco abuse  Tobacco cessation discusssed  DVT prophylaxis: Heparin ggt Code Status: Full  Family Communication: None at bedside  Disposition Plan: Home in 24-48 hrs.  Consultants:   PCCM   IR   Procedures:   ECHO 12/11 ------------------------------------------------------------------- Study Conclusions  - Procedure narrative: Transthoracic echocardiography. Image   quality was suboptimal. The study was technically difficult, as a   result of restricted patient mobility. - Left ventricle: The cavity size was normal. Wall thickness was   increased in a pattern of moderate LVH. Systolic function was   vigorous. The estimated ejection fraction was in the range of 65%   to 70%. Incoordinate septal motion. The study is not technically   sufficient to allow evaluation of LV diastolic function. - Mitral valve: Mildly thickened leaflets . There was trivial   regurgitation. - Left atrium: The atrium was mildly dilated. - Right ventricle: Appears dilated and hypokinetic, with a   hyperdynamic apex (McConnell&'s sign), suggestive of RV strain. - Right atrium: The atrium was mildly dilated. - Tricuspid valve: There was moderate regurgitation. - Pulmonary arteries: PA peak pressure: 57 mm Hg (S). - Inferior vena cava: The vessel was normal in size. The   respirophasic diameter changes were in the normal range (>= 50%),   consistent with normal central venous pressure. - Pericardium, extracardiac: There was no pericardial effusion.  Impressions:  - Technically difficult study,  LVEF 65-70%, moderate LVH,   incoordinate septal motion, mild biatrial enlargement, the RV is   not well-visualized, however, it appears hypokinetic and dilated   in the subcostal images with  sparing of the apex suggestive of   right heart strain, there is moderate TR, RVSP 57 mmHg, normal   IVC.  Antimicrobials:  None    Objective: Vitals:   05/03/16 2325 05/04/16 0325 05/04/16 0751 05/04/16 0800  BP: 136/77 134/80 136/85   Pulse: 99 99 97 95  Resp: 19 (!) 23 16 20   Temp: 98.5 F (36.9 C) 98.5 F (36.9 C) 98.9 F (37.2 C)   TempSrc: Oral Oral Oral   SpO2: 95% 95% 92% 97%  Weight:      Height:        Intake/Output Summary (Last 24 hours) at 05/04/16 0831 Last data filed at 05/04/16 0800  Gross per 24 hour  Intake            543.2 ml  Output              790 ml  Net           -246.8 ml   Filed Weights   05/01/16 2230 05/02/16 0636  Weight: 116.1 kg (256 lb) 111.2 kg (245 lb 2.4 oz)    Examination:  General exam: Appears calm and comfortable  Respiratory system: On O2 [place] Clear to auscultation. No wheezes,crackle or rhonchi Cardiovascular system: S1 & S2 heard, [place] @ 106. No JVD, murmurs, rubs or gallops Gastrointestinal system: Abdomen is nondistended, soft and nontender. Normal bowel sounds heard. Central nervous system: Alert and oriented. No focal neurological deficits. Extremities: Mild edema of left LE  Skin: No rashes, lesions or ulcers Psychiatry: Judgement and insight appear normal.  Data Reviewed: I have personally reviewed following labs and imaging studies  CBC:  Recent Labs Lab 05/01/16 2330 05/03/16 0224 05/04/16 0237  WBC 14.0* 12.8* 10.8*  NEUTROABS 11.2*  --   --   HGB 14.8 13.1 13.0  HCT 40.7 38.5* 37.3*  MCV 86.4 88.1 86.9  PLT 185 204 [id number]   Basic Metabolic Panel:  Recent Labs Lab 05/01/16 2234 05/03/16 0224  NA 132* 134*  K 3.7 3.9  CL 100* 102  CO2 21* 21*  GLUCOSE 162* 119*  BUN 13 8  CREATININE 1.17 0.97  CALCIUM 8.7* 8.5*   GFR: Estimated Creatinine Clearance: 102.1 mL/min (by C-G formula based on SCr of 0.97 mg/dL). Liver Function Tests:  Recent Labs Lab 05/01/16 2303  AST 28  ALT 18  ALKPHOS  61  BILITOT 0.9  PROT 7.5  ALBUMIN 3.0*    Recent Labs Lab 05/01/16 2303  LIPASE 15   No results for input(s): AMMONIA in the last 168 hours. Coagulation Profile:  Recent Labs Lab 05/03/16 1004  INR 1.08   Cardiac Enzymes:  Recent Labs Lab 05/01/16 2303  TROPONINI 0.03*   BNP (last 3 results) No results for input(s): PROBNP in the last 8760 hours. HbA1C:  Recent Labs  05/02/16 1006  HGBA1C 6.2*   CBG:  Recent Labs Lab 05/01/16 2240 05/02/16 0032 05/02/16 0625  GLUCAP 176* 156* 157*   Lipid Profile: No results for input(s): CHOL, HDL, LDLCALC, TRIG, CHOLHDL, LDLDIRECT in the last 72 hours. Thyroid Function Tests: No results for input(s): TSH, T4TOTAL, FREET4, T3FREE, THYROIDAB in the last 72 hours. Anemia Panel: No results for input(s): VITAMINB12, FOLATE, FERRITIN, TIBC, IRON, RETICCTPCT in the last 72 hours. Sepsis Labs: No results for input(s):  PROCALCITON, LATICACIDVEN in the last 168 hours.  Recent Results (from the past 240 hour(s))  MRSA PCR Screening     Status: None   Collection Time: 05/02/16  6:32 AM  Result Value Ref Range Status   MRSA by PCR NEGATIVE NEGATIVE Final    Comment:        The GeneXpert MRSA Assay (FDA approved for NASAL specimens only), is one component of a comprehensive MRSA colonization surveillance program. It is not intended to diagnose MRSA infection nor to guide or monitor treatment for MRSA infections.   MRSA PCR Screening     Status: None   Collection Time: 05/03/16  3:53 PM  Result Value Ref Range Status   MRSA by PCR NEGATIVE NEGATIVE Final    Comment:        The GeneXpert MRSA Assay (FDA approved for NASAL specimens only), is one component of a comprehensive MRSA colonization surveillance program. It is not intended to diagnose MRSA infection nor to guide or monitor treatment for MRSA infections.       Radiology Studies: No results found.   Scheduled Meds: . amLODipine  2.5 mg Oral Daily   . mouth rinse  15 mL Mouth Rinse BID  . sodium chloride flush  3 mL Intravenous Q12H  . tamsulosin  0.4 mg Oral QPC supper   Continuous Infusions: . heparin 1,800 Units/hr (05/04/16 0600)     LOS: 2 days    Chipper Oman, MD [facility] Pager [phone]  If 7PM-7AM, please contact night-coverage [email or web address] Password [email or web address] 05/04/2016, 8:31 AM

## 2016-05-05 LAB — CBC
HCT: 35.5 % — ABNORMAL LOW (ref 39.0–52.0)
Hemoglobin: 12 g/dL — ABNORMAL LOW (ref 13.0–17.0)
MCH: 29.3 pg (ref 26.0–34.0)
MCHC: 33.8 g/dL (ref 30.0–36.0)
MCV: 86.8 fL (ref 78.0–100.0)
Platelets: 264 10*3/uL (ref 150–400)
RBC: 4.09 MIL/uL — ABNORMAL LOW (ref 4.22–5.81)
RDW: 13.9 % (ref 11.5–15.5)
WBC: 9.7 10*3/uL (ref 4.0–10.5)

## 2016-05-05 LAB — FACTOR 5 LEIDEN

## 2016-05-05 LAB — HEPARIN LEVEL (UNFRACTIONATED): HEPARIN UNFRACTIONATED: 0.56 [IU]/mL (ref 0.30–0.70)

## 2016-05-05 NOTE — Progress Notes (Signed)
PROGRESS NOTE [facility]   Wyatt Berry   [id number] DOB: [birth year]  DOA: 05/01/2016 PCP: Wenda Low, MD   Brief Narrative:  65 year old male with a history of hepatitis C status post treatment with Harvoni, hypertension, "prediabetes" presented with two-week history of upper abdominal pain and dyspnea on exertion. Approximately 2 weeks ago, the patient began experiencing sharp pleuritic left upper quadrant pain that subsequently migrated to the right upper quadrant. He did not have any fevers, chills, nausea, vomiting, diarrhea. He had a few days of some relief, but when his abdominal pain returned to the left upper quadrant it was worse than when it began. During the past week, he has had increasing dyspnea on exertion without any frank chest discomfort, dizziness, or syncope. On the evening of 05/01/2016, the patient was sitting up in his bed when he slumped over and was diaphoretic. EMS was activated. There was no prodromal symptoms. His loss of consciousness lastedless than 30 seconds. In the emergency department, the patient was initially in a dynamically stable, but had a sudden onset of left upper quadrant, left-sided chest discomfort. He became hypotensive and diaphoretic and subsequently had a near syncopal episode. CT angiogram of the chest revealed large bilateral pulmonary emboli involving the central pulmonary artery with large burden in the LLLpulmonary artery and near occlusion of the LLLbranches. There was evidence of RV strain as well as LLLground glass opacities suggesting pulmonary infarct.   Subjective: Patient seen and examined at bedside. No new complaints. Doing well. No acute events overnight. Still requires O2 when ambulating. Now on 1 L at rest.   Assessment & Plan: Acute large pulmonary embolism/DVT left femoral vein, with RV strain IR consult appreciated - no thrombolysis at this time D/C heparin ggt  Start Xarelto  Will need A/C long term  PT  eval - appreciated  OOB as tolerated  Coag w/u so far negative  Can transfer to tele regular floor   Acute respiratory failure with hypoxia - improving but still requires O2 supplementation  Secondary to PE  Wean O2 supplement  As tolerated to keep Sats > 92%   HTN - Stable  BP stable after resuming home meds   Tobacco abuse  Tobacco cessation discusssed  DVT prophylaxis: Heparin ggt Code Status: Full  Family Communication: None at bedside  Disposition Plan: Hopefully tomorrow if maintains O2 sat above 92% on RA   Consultants:   PCCM   IR   Procedures:   ECHO 12/11 ------------------------------------------------------------------- Study Conclusions  - Procedure narrative: Transthoracic echocardiography. Image   quality was suboptimal. The study was technically difficult, as a   result of restricted patient mobility. - Left ventricle: The cavity size was normal. Wall thickness was   increased in a pattern of moderate LVH. Systolic function was   vigorous. The estimated ejection fraction was in the range of 65%   to 70%. Incoordinate septal motion. The study is not technically   sufficient to allow evaluation of LV diastolic function. - Mitral valve: Mildly thickened leaflets . There was trivial   regurgitation. - Left atrium: The atrium was mildly dilated. - Right ventricle: Appears dilated and hypokinetic, with a   hyperdynamic apex (McConnell&'s sign), suggestive of RV strain. - Right atrium: The atrium was mildly dilated. - Tricuspid valve: There was moderate regurgitation. - Pulmonary arteries: PA peak pressure: 57 mm Hg (S). - Inferior vena cava: The vessel was normal in size. The   respirophasic diameter changes were in the normal range (>= 50%),  consistent with normal central venous pressure. - Pericardium, extracardiac: There was no pericardial effusion.  Impressions:  - Technically difficult study, LVEF 65-70%, moderate LVH,   incoordinate septal  motion, mild biatrial enlargement, the RV is   not well-visualized, however, it appears hypokinetic and dilated   in the subcostal images with sparing of the apex suggestive of   right heart strain, there is moderate TR, RVSP 57 mmHg, normal   IVC.  Antimicrobials:  None    Objective: Vitals:   05/04/16 2300 05/05/16 0300 05/05/16 0700 05/05/16 1233  BP: 127/80 129/82 134/81 131/87  Pulse: 92 81 96 94  Resp: 18 13 19 19   Temp: 98.1 F (36.7 C) 98.4 F (36.9 C) 98 F (36.7 C) 97.9 F (36.6 C)  TempSrc: Oral Oral Oral Oral  SpO2: 95% 93% 97% 95%  Weight:      Height:        Intake/Output Summary (Last 24 hours) at 05/05/16 1530 Last data filed at 05/05/16 1200  Gross per 24 hour  Intake              165 ml  Output             1375 ml  Net            -1210 ml   Filed Weights   05/01/16 2230 05/02/16 0636  Weight: 116.1 kg (256 lb) 111.2 kg (245 lb 2.4 oz)    Examination:  General exam: NAD  Respiratory system:  [place], CTA, no wheezing  Cardiovascular system: S1S2 no murmurs  Gastrointestinal system: Abdomen is nondistended, soft and nontender.  Extremities: Mild edema of left LE - improving  Skin: No rashes, lesions or ulcers  Data Reviewed: I have personally reviewed following labs and imaging studies  CBC:  Recent Labs Lab 05/01/16 2330 05/03/16 0224 05/04/16 0237 05/05/16 0235  WBC 14.0* 12.8* 10.8* 9.7  NEUTROABS 11.2*  --   --   --   HGB 14.8 13.1 13.0 12.0*  HCT 40.7 38.5* 37.3* 35.5*  MCV 86.4 88.1 86.9 86.8  PLT 185 204 242 [id number]   Basic Metabolic Panel:  Recent Labs Lab 05/01/16 2234 05/03/16 0224  NA 132* 134*  K 3.7 3.9  CL 100* 102  CO2 21* 21*  GLUCOSE 162* 119*  BUN 13 8  CREATININE 1.17 0.97  CALCIUM 8.7* 8.5*   GFR: Estimated Creatinine Clearance: 102.1 mL/min (by C-G formula based on SCr of 0.97 mg/dL). Liver Function Tests:  Recent Labs Lab 05/01/16 2303  AST 28  ALT 18  ALKPHOS 61  BILITOT 0.9  PROT 7.5  ALBUMIN  3.0*    Recent Labs Lab 05/01/16 2303  LIPASE 15   No results for input(s): AMMONIA in the last 168 hours. Coagulation Profile:  Recent Labs Lab 05/03/16 1004  INR 1.08   Cardiac Enzymes:  Recent Labs Lab 05/01/16 2303  TROPONINI 0.03*   BNP (last 3 results) No results for input(s): PROBNP in the last 8760 hours. HbA1C: No results for input(s): HGBA1C in the last 72 hours. CBG:  Recent Labs Lab 05/01/16 2240 05/02/16 0032 05/02/16 0625  GLUCAP 176* 156* 157*   Lipid Profile: No results for input(s): CHOL, HDL, LDLCALC, TRIG, CHOLHDL, LDLDIRECT in the last 72 hours. Thyroid Function Tests: No results for input(s): TSH, T4TOTAL, FREET4, T3FREE, THYROIDAB in the last 72 hours. Anemia Panel: No results for input(s): VITAMINB12, FOLATE, FERRITIN, TIBC, IRON, RETICCTPCT in the last 72 hours. Sepsis Labs: No results for input(s):  PROCALCITON, LATICACIDVEN in the last 168 hours.  Recent Results (from the past 240 hour(s))  MRSA PCR Screening     Status: None   Collection Time: 05/02/16  6:32 AM  Result Value Ref Range Status   MRSA by PCR NEGATIVE NEGATIVE Final    Comment:        The GeneXpert MRSA Assay (FDA approved for NASAL specimens only), is one component of a comprehensive MRSA colonization surveillance program. It is not intended to diagnose MRSA infection nor to guide or monitor treatment for MRSA infections.   MRSA PCR Screening     Status: None   Collection Time: 05/03/16  3:53 PM  Result Value Ref Range Status   MRSA by PCR NEGATIVE NEGATIVE Final    Comment:        The GeneXpert MRSA Assay (FDA approved for NASAL specimens only), is one component of a comprehensive MRSA colonization surveillance program. It is not intended to diagnose MRSA infection nor to guide or monitor treatment for MRSA infections.       Radiology Studies: No results found.   Scheduled Meds: . amLODipine  2.5 mg Oral Daily  . hydrochlorothiazide  12.5 mg  Oral Daily  . losartan  100 mg Oral Daily  . mouth rinse  15 mL Mouth Rinse BID  . rivaroxaban  15 mg Oral BID WC   Followed by  . [START ON 05/26/2016] rivaroxaban  20 mg Oral Q supper  . sodium chloride flush  3 mL Intravenous Q12H  . tamsulosin  0.4 mg Oral QPC supper   Continuous Infusions:    LOS: 3 days    Chipper Oman, MD [facility] Pager [phone]  If 7PM-7AM, please contact night-coverage [email or web address] Password [email or web address] 05/05/2016, 3:30 PM

## 2016-05-05 NOTE — Evaluation (Signed)
Physical Therapy Evaluation Patient Details Name: Wyatt Berry MRN: [id number] DOB: 1950/06/11 Today's Date: 05/05/2016   History of Present Illness  Pt is a 65 y/o male admitted secondary to a syncopal episode at home and found to have a PE and L LE DVT. PMH including but not limited to HTN and Hep C.  Clinical Impression  Pt presented supine in bed with HOB elevated, awake and willing to participate in therapy session. Prior to admission, pt reported that he was independent with all functional mobility and ADLs. Pt performed all functional mobility with supervision to min guard for safety, no physical assist was needed. Pt was on 0.5L of O2 via [place] when PT entered room. Pt ambulated on RA with significant decrease in SPO2 to 74% x1, and with standing rest break and focus on deep breathing pt was able to increased SPO2 to 88%. Throughout ambulation, pt's SPO2 maintained in the high 80's. Pt required reapplication of O2 (1L) when back in room at end of session to allow SPO2 to increase >90%. Pt would continue to benefit from skilled physical therapy services at this time while admitted to address his below listed limitations in order to improve his overall safety and independence with functional mobility.      Follow Up Recommendations No PT follow up;Supervision for mobility/OOB    Equipment Recommendations  None recommended by PT    Recommendations for Other Services       Precautions / Restrictions Precautions Precautions: Fall Restrictions Weight Bearing Restrictions: No      Mobility  Bed Mobility Overal bed mobility: Needs Assistance Bed Mobility: Supine to Sit;Sit to Supine     Supine to sit: Supervision;HOB elevated Sit to supine: Supervision   General bed mobility comments: pt required increased time, supervision for safety  Transfers Overall transfer level: Needs assistance Equipment used: None Transfers: Sit to/from Stand Sit to Stand: Min guard          General transfer comment: pt required increased time, min guard for safety  Ambulation/Gait Ambulation/Gait assistance: Min guard Ambulation Distance (Feet): 150 Feet Assistive device: None Gait Pattern/deviations: Step-through pattern;Decreased stride length Gait velocity: decreased Gait velocity interpretation: Below normal speed for age/gender General Gait Details: no instability noted with gait. pt ambulated on RA with SPO2 decreasing to 74% x1. pt was able to increased SPO2 to 88% with a standing rest break and focusing on deep breathing. Pt's SPO2 remained in the high 80's throughout gait. Pt required reapplication of 1L of O2 when back in room to increase his SPO2 to >90%.   Stairs            Wheelchair Mobility    Modified Rankin (Stroke Patients Only)       Balance Overall balance assessment: Needs assistance Sitting-balance support: Feet supported;No upper extremity supported Sitting balance-Leahy Scale: Good     Standing balance support: During functional activity;No upper extremity supported Standing balance-Leahy Scale: Fair                               Pertinent Vitals/Pain Pain Assessment: No/denies pain    Home Living Family/patient expects to be discharged to:: Private residence Living Arrangements: Spouse/significant other Available Help at Discharge: Family;Available PRN/intermittently Type of Home: House Home Access: Stairs to enter Entrance Stairs-Rails: Psychiatric nurse of Steps: 5 Home Layout: One level Home Equipment: None      Prior Function Level of Independence: Independent  Hand Dominance        Extremity/Trunk Assessment   Upper Extremity Assessment Upper Extremity Assessment: Overall WFL for tasks assessed    Lower Extremity Assessment Lower Extremity Assessment: Overall WFL for tasks assessed    Cervical / Trunk Assessment Cervical / Trunk Assessment: Normal   Communication   Communication: No difficulties  Cognition Arousal/Alertness: Awake/alert Behavior During Therapy: WFL for tasks assessed/performed Overall Cognitive Status: Within Functional Limits for tasks assessed                      General Comments      Exercises     Assessment/Plan    PT Assessment Patient needs continued PT services  PT Problem List Decreased balance;Decreased mobility;Decreased coordination;Cardiopulmonary status limiting activity          PT Treatment Interventions Gait training;Stair training;Therapeutic activities;Functional mobility training;Therapeutic exercise;Balance training;Neuromuscular re-education;Patient/family education    PT Goals (Current goals can be found in the Care Plan section)  Acute Rehab PT Goals Patient Stated Goal: return home PT Goal Formulation: With patient Time For Goal Achievement: 05/19/16 Potential to Achieve Goals: Good    Frequency Min 3X/week   Barriers to discharge        Co-evaluation               End of Session Equipment Utilized During Treatment: Gait belt Activity Tolerance: Patient tolerated treatment well Patient left: in bed;with call bell/phone within reach;with nursing/sitter in room (pt's nurse in room administering meds) Nurse Communication: Mobility status         Time: [id number] PT Time Calculation (min) (ACUTE ONLY): 21 min   Charges:   PT Evaluation $PT Eval Moderate Complexity: 1 Procedure     PT G CodesClearnce Sorrel Simran Mannis 05/05/2016, 11:15 AM Sherie Don, [place], DPT [phone]

## 2016-05-06 DIAGNOSIS — I82412 Acute embolism and thrombosis of left femoral vein: Secondary | ICD-10-CM

## 2016-05-06 DIAGNOSIS — Z716 Tobacco abuse counseling: Secondary | ICD-10-CM

## 2016-05-06 LAB — CBC
HEMATOCRIT: 35.8 % — AB (ref 39.0–52.0)
Hemoglobin: 12 g/dL — ABNORMAL LOW (ref 13.0–17.0)
MCH: 29 pg (ref 26.0–34.0)
MCHC: 33.5 g/dL (ref 30.0–36.0)
MCV: 86.5 fL (ref 78.0–100.0)
PLATELETS: 306 10*3/uL (ref 150–400)
RBC: 4.14 MIL/uL — AB (ref 4.22–5.81)
RDW: 13.9 % (ref 11.5–15.5)
WBC: 8.9 10*3/uL (ref 4.0–10.5)

## 2016-05-06 LAB — PROTHROMBIN GENE MUTATION

## 2016-05-06 LAB — HEPARIN LEVEL (UNFRACTIONATED)

## 2016-05-06 MED ORDER — RIVAROXABAN 15 MG PO TABS: 15.0000 mg | ORAL_TABLET | Freq: Two times a day (BID) | ORAL | 0 refills | Status: DC

## 2016-05-06 MED ORDER — AMLODIPINE BESYLATE 2.5 MG PO TABS: 2.5000 mg | ORAL_TABLET | Freq: Every day | ORAL | 0 refills | Status: DC

## 2016-05-06 MED ORDER — RIVAROXABAN 20 MG PO TABS: 20.0000 mg | ORAL_TABLET | Freq: Every day | ORAL | 0 refills | Status: DC

## 2016-05-06 MED ORDER — RIVAROXABAN (XARELTO) EDUCATION KIT FOR DVT/PE PATIENTS: 1.0000 | PACK | Freq: Once | 0 refills | Status: AC

## 2016-05-06 NOTE — Progress Notes (Signed)
PT Cancellation Note  Patient Details Name: NAWAF SISAK MRN: [id number] DOB: 25-Jul-1950   Cancelled Treatment:    Reason Eval/Treat Not Completed: Patient scheduled for d/c.  Spoke with pt who reports he ambulated earlier with nursing without difficulty and o2 sats in 90's.  Nurse confirmed pt doing well.  No session today.   Zoltan Genest LUBECK 05/06/2016, 2:46 PM

## 2016-05-06 NOTE — Progress Notes (Signed)
05/06/2016 5:10 PM Discharge AVS meds taken today and those due this evening reviewed.  Follow-up appointments and when to call md reviewed.  D/C IV and TELE.  Questions and concerns addressed.   D/C home per orders. [place]

## 2016-05-06 NOTE — Discharge Summary (Signed)
Physician Discharge Summary  Wyatt Berry  TFT:[id number]  DOB: Aug 22, 1950  DOA: 05/01/2016 PCP: Wenda Low, MD  Admit date: 05/01/2016 Discharge date: 05/06/2016  Admitted From: Home   Disposition: Home  Recommendations for Outpatient Follow-up:  1. Follow up with PCP in 1-2 weeks 2. Please obtain BMP/CBC in one week 3. Repeat ECHO in 6-8 weeks 4. May need hematology referral   Discharge Condition: Stable  CODE STATUS: FULL Diet recommendation: Heart Healthy   Brief/Interim Summary: 65 year old male with a history of hepatitis C status post treatment with Harvoni, hypertension, "prediabetes" presented with two-week history of upper abdominal pain and dyspnea on exertion. Approximately 2 weeks PTA, the patient began experiencing sharp pleuritic left upper quadrant pain that subsequently migrated to the right upper quadrant. He also had increasing dyspnea on exertion without any frank chest discomfort, dizziness, or syncope. On the evening of 05/01/2016, the patient was sitting up in his bed when he slumped over and was diaphoretic. EMS was activated. There was no prodromal symptoms. His loss of consciousness lastedless than 30 seconds. In the emergency department, the patient was initially in a dynamically stable, but had a sudden onset of left upper quadrant, left-sided chest discomfort. He became hypotensive and diaphoretic and subsequently had a near syncopal episode. CT angiogram of the chest revealed large bilateral pulmonary emboli involving the central pulmonary artery with large burden in the LLLpulmonary artery and near occlusion of the LLLbranches. There was evidence of RV strain as well as LLLground glass opacities suggesting pulmonary infarct. Patient was admitted to SDU for treatment, was started on heparin drip, O2 supplements and IVF. IR and PCCM were consulted. Patient subsequently improve after 72 hrs of iv heparin patient was transitioned to Xarelto. O2 was wean until  patient tolerate room air with out desaturation. Patient now breathing on RA with sats> 91% at rest and with ambulation. Patient will be discharge home with long term anticoagulation and to follow up with PCP within 1 week.   Subjective: Patient seen and examined. Doing well ambulated without oxygen. No chest pain, SOB or palpitations.  Discharge Diagnoses:  Acute large pulmonary embolism/DVT left femoral vein, with RV strain Treated with heparin drip  IR consulted - no thrombolysis at this time - follow up in 2 weeks Continue Xarelto  Will need A/C long term   Coag w/u so far negative  Need to repeat ECHO in 6-8 weeks Follow with pulmonologist in 3-4 weeks  May need hematology referral   Acute respiratory failure with hypoxia - resolved  Secondary to PE  Advised to rest for 1 week, avoid strenuous activities, heavy lifting, etc.   HTN - Stable  Continue home meds  Follow up with PCP   Hepatitis C  Follow up with PCP   Tobacco abuse  Tobacco cessation discusssed  Discharge Instructions  Discharge Instructions    Call MD for:  difficulty breathing, headache or visual disturbances    Complete by:  As directed    Call MD for:  extreme fatigue    Complete by:  As directed    Call MD for:  hives    Complete by:  As directed    Call MD for:  persistant dizziness or light-headedness    Complete by:  As directed    Call MD for:  persistant nausea and vomiting    Complete by:  As directed    Call MD for:  redness, tenderness, or signs of infection (pain, swelling, redness, odor or Wyatt/yellow discharge around  incision site)    Complete by:  As directed    Call MD for:  severe uncontrolled pain    Complete by:  As directed    Call MD for:  temperature >100.4    Complete by:  As directed    Diet - low sodium heart healthy    Complete by:  As directed    Discharge instructions    Complete by:  As directed    Discharge instructions    Complete by:  As directed    Rest for  1 week, light activity only avoid strenuous activities, heavy lifting, etc.   Increase activity slowly    Complete by:  As directed      Allergies as of 05/06/2016   No Known Allergies     Medication List    STOP taking these medications   HYDROcodone-acetaminophen 5-325 MG tablet Commonly known as:  NORCO/VICODIN   ibuprofen 200 MG tablet Commonly known as:  ADVIL,MOTRIN     TAKE these medications   amLODipine 2.5 MG tablet Commonly known as:  NORVASC Take 1 tablet (2.5 mg total) by mouth daily. Start taking on:  05/07/2016 What changed:  medication strength  how much to take   losartan-hydrochlorothiazide 100-12.5 MG tablet Commonly known as:  HYZAAR Take 1 tablet by mouth daily.   Rivaroxaban 15 MG Tabs tablet Commonly known as:  XARELTO Take 1 tablet (15 mg total) by mouth 2 (two) times daily with a meal.   rivaroxaban 20 MG Tabs tablet Commonly known as:  XARELTO Take 1 tablet (20 mg total) by mouth daily with supper. Start taking on:  05/26/2016   rivaroxaban Kit Commonly known as:  XARELTO 1 kit by Does not apply route once.   tamsulosin 0.4 MG Caps capsule Commonly known as:  FLOMAX Take 0.4 mg by mouth daily after supper.      Follow-up Information    HUSAIN,KARRAR, MD. Schedule an appointment as soon as possible for a visit in 1 week(s).   Specialty:  Internal Medicine Contact information: [address] [place] [address] [phone]        YACOUB,WESAM, MD. Schedule an appointment as soon as possible for a visit in 4 week(s).   Specialty:  Pulmonary Disease Contact information: [address] [address] [phone]          No Known Allergies  Consultations:  PCCM   Vascular/IR    Procedures/Studies: Dg Chest 2 View  Result Date: 05/02/2016 CLINICAL DATA:  Productive cough and dyspnea for several days. Syncope at home. EXAM: CHEST  2 VIEW COMPARISON:  09/01/2013 lumbar spine radiographs  which partially the lung bases FINDINGS: Heart is top-normal size with aortic atherosclerosis. Upper lobe predominant hyperinflation is noted bilaterally. More confluent airspace opacity noted peripherally at the left lung base suspicious for pneumonia. Left apical scarring is suggested. No significant effusion or pneumothorax. IMPRESSION: Confluent airspace opacity at the left lung base laterally. Findings may reflect pneumonia given patient's symptoms. Ill-defined area of spiculation in the left upper lobe may reflect scarring. Followup PA and lateral chest X-ray is recommended in 3-4 weeks following trial of antibiotic therapy to ensure resolution and exclude underlying malignancy. Electronically Signed   By: Ashley Royalty M.D.   On: 05/02/2016 00:35   Ct Angio Chest Pe W And/or Wo Contrast  Result Date: 05/02/2016 CLINICAL DATA:  65 year old male with shortness of breath and chest pain. EXAM: CT ANGIOGRAPHY CHEST WITH CONTRAST TECHNIQUE: Multidetector CT  imaging of the chest was performed using the standard protocol during bolus administration of intravenous contrast. Multiplanar CT image reconstructions and MIPs were obtained to evaluate the vascular anatomy. CONTRAST:  100 cc Isovue 370 COMPARISON:  Chest radiograph dated 05/01/2016 FINDINGS: Cardiovascular: There are large bilateral pulmonary artery emboli extending from the central pulmonary artery is into the lobar, and segmental branches. The largest clot burden involves the left lower lobe pulmonary artery with near complete occlusion of the left lower lobe branches. There is mild dilatation of the main pulmonary trunk suggestive of associated pulmonary hypertension. There is enlargement of the right ventricle with flattening of the intraventricular septum compatible with right cardiac straining. The RV/LV ratio is 1.5. There is no pericardial effusion. Multi vessel coronary vascular calcifications primarily involving the LAD and left circumflex  artery. There is atherosclerotic calcification of the thoracic aorta. No aneurysmal dilatation or evidence of dissection. The origins of the great vessels of the aortic arch appear patent. Mediastinum/Nodes: There is no hilar or mediastinal adenopathy. The esophagus and the thyroid gland are grossly unremarkable. Lungs/Pleura: There is emphysematous changes of the lungs. Wedge-shaped area of pleural based ground-glass density involving the left lower lobe most compatible with pulmonary infarct. A 2.3 x 1.6 cm focal pleural based consolidative area in the left a packs noted which may represent scarring, an area of pulmonary infarct, and less likely infiltrate. Underlying mass is not excluded. Correlation with clinical exam and follow-up recommended. Subsegmental right lung base densities likely represent atelectatic changes/ scarring versus less likely infarct. There is a small left pleural effusion. No pneumothorax. The central airways are patent. Upper Abdomen: There is irregularity of the hepatic contour likely related to underlying cirrhosis. Musculoskeletal: No chest wall abnormality. No acute or significant osseous findings. Review of the MIP images confirms the above findings. IMPRESSION: Large bilateral pulmonary artery emboli involving the central pulmonary artery is with extension into the lobar and segmental branches. The largest clot burden involves the left lower lobe pulmonary arteries with associated focal left lower lobe pulmonary infarct. Positive for acute PE with CTevidence of right heart strain (RV/LV Ratio = 1.5) consistent with at least submassive (intermediate risk) PE. The presence of right heart strain has been associated with an increased risk of morbidity and mortality. Focal left apical consolidative changes may represent scarring, infarct, or less likely infiltrate. A pulmonary mass is not excluded. Follow-up recommended. Emphysema. These results were called by telephone at the time of  interpretation on 05/02/2016 at 3:35 am to Nurse Felps, who verbally acknowledged these results. Electronically Signed   By: Anner Crete M.D.   On: 05/02/2016 03:36   US Abdomen Limited Ruq  Result Date: 04/11/2016 CLINICAL DATA:  Hepatitis-C history.  Screening exam. EXAM: US ABDOMEN LIMITED - RIGHT UPPER QUADRANT COMPARISON:  Ultrasound 10/20/2015 . FINDINGS: Gallbladder: No gallstones noted.  5 mm gallbladder polyp noted. Common bile duct: Diameter: 3.6 mm Liver: No focal lesion identified. Within normal limits in parenchymal echogenicity. IMPRESSION: No acute abnormality.  5 mm gallbladder polyp noted. Electronically Signed   By: Marcello Moores  Register   On: 04/11/2016 12:59    ECHO - 05/02/16 ------------------------------------------------------------------- Study Conclusions  - Procedure narrative: Transthoracic echocardiography. Image   quality was suboptimal. The study was technically difficult, as a   result of restricted patient mobility. - Left ventricle: The cavity size was normal. Wall thickness was   increased in a pattern of moderate LVH. Systolic function was   vigorous. The estimated ejection fraction was in  the range of 65%   to 70%. Incoordinate septal motion. The study is not technically   sufficient to allow evaluation of LV diastolic function. - Mitral valve: Mildly thickened leaflets . There was trivial   regurgitation. - Left atrium: The atrium was mildly dilated. - Right ventricle: Appears dilated and hypokinetic, with a   hyperdynamic apex (McConnell&'s sign), suggestive of RV strain. - Right atrium: The atrium was mildly dilated. - Tricuspid valve: There was moderate regurgitation. - Pulmonary arteries: PA peak pressure: 57 mm Hg (S). - Inferior vena cava: The vessel was normal in size. The   respirophasic diameter changes were in the normal range (>= 50%),   consistent with normal central venous pressure. - Pericardium, extracardiac: There was no  pericardial effusion.  Impressions:  - Technically difficult study, LVEF 65-70%, moderate LVH,   incoordinate septal motion, mild biatrial enlargement, the RV is   not well-visualized, however, it appears hypokinetic and dilated   in the subcostal images with sparing of the apex suggestive of   right heart strain, there is moderate TR, RVSP 57 mmHg, normal   IVC.   Discharge Exam: Vitals:   05/06/16 0449 05/06/16 1016  BP: 132/74 128/73  Pulse: 83   Resp: 18   Temp: 98.6 F (37 C)    Vitals:   05/05/16 2037 05/05/16 2123 05/06/16 0449 05/06/16 1016  BP: (!) 170/92 138/76 132/74 128/73  Pulse: (!) 105 93 83   Resp: 20  18   Temp: 98.5 F (36.9 C)  98.6 F (37 C)   TempSrc: Oral  Oral   SpO2: 97%  98%   Weight: 109.1 kg (240 lb 8 oz)     Height:        General: Pt is alert, awake, not in acute distress Cardiovascular: RRR, S1/S2 +, no rubs, no gallops Respiratory: CTA bilaterally, no wheezing, no rhonchi Abdominal: Soft, NT, ND, bowel sounds + Extremities: no edema, no cyanosis    The results of significant diagnostics from this hospitalization (including imaging, microbiology, ancillary and laboratory) are listed below for reference.     Microbiology: Recent Results (from the past 240 hour(s))  MRSA PCR Screening     Status: None   Collection Time: 05/02/16  6:32 AM  Result Value Ref Range Status   MRSA by PCR NEGATIVE NEGATIVE Final    Comment:        The GeneXpert MRSA Assay (FDA approved for NASAL specimens only), is one component of a comprehensive MRSA colonization surveillance program. It is not intended to diagnose MRSA infection nor to guide or monitor treatment for MRSA infections.   MRSA PCR Screening     Status: None   Collection Time: 05/03/16  3:53 PM  Result Value Ref Range Status   MRSA by PCR NEGATIVE NEGATIVE Final    Comment:        The GeneXpert MRSA Assay (FDA approved for NASAL specimens only), is one component of  a comprehensive MRSA colonization surveillance program. It is not intended to diagnose MRSA infection nor to guide or monitor treatment for MRSA infections.      Labs: BNP (last 3 results)  Recent Labs  05/01/16 2304  BNP 79.3   Basic Metabolic Panel:  Recent Labs Lab 05/01/16 2234 05/03/16 0224  NA 132* 134*  K 3.7 3.9  CL 100* 102  CO2 21* 21*  GLUCOSE 162* 119*  BUN 13 8  CREATININE 1.17 0.97  CALCIUM 8.7* 8.5*   Liver Function Tests:  Recent Labs Lab 05/01/16 2303  AST 28  ALT 18  ALKPHOS 61  BILITOT 0.9  PROT 7.5  ALBUMIN 3.0*    Recent Labs Lab 05/01/16 2303  LIPASE 15   No results for input(s): AMMONIA in the last 168 hours. CBC:  Recent Labs Lab 05/01/16 2330 05/03/16 0224 05/04/16 0237 05/05/16 0235 05/06/16 0229  WBC 14.0* 12.8* 10.8* 9.7 8.9  NEUTROABS 11.2*  --   --   --   --   HGB 14.8 13.1 13.0 12.0* 12.0*  HCT 40.7 38.5* 37.3* 35.5* 35.8*  MCV 86.4 88.1 86.9 86.8 86.5  PLT 185 204 242 264 306   Cardiac Enzymes:  Recent Labs Lab 05/01/16 2303  TROPONINI 0.03*   BNP: Invalid input(s): POCBNP CBG:  Recent Labs Lab 05/01/16 2240 05/02/16 0032 05/02/16 0625  GLUCAP 176* 156* 157*   D-Dimer No results for input(s): DDIMER in the last 72 hours. Hgb A1c No results for input(s): HGBA1C in the last 72 hours. Lipid Profile No results for input(s): CHOL, HDL, LDLCALC, TRIG, CHOLHDL, LDLDIRECT in the last 72 hours. Thyroid function studies No results for input(s): TSH, T4TOTAL, T3FREE, THYROIDAB in the last 72 hours.  Invalid input(s): FREET3 Anemia work up No results for input(s): VITAMINB12, FOLATE, FERRITIN, TIBC, IRON, RETICCTPCT in the last 72 hours. Urinalysis    Component Value Date/Time   COLORURINE YELLOW 05/01/2016 2234   APPEARANCEUR CLEAR 05/01/2016 2234   LABSPEC 1.021 05/01/2016 2234   PHURINE 5.0 05/01/2016 2234   GLUCOSEU NEGATIVE 05/01/2016 [place] 05/01/2016 [place] 05/01/2016 [place] 05/01/2016 2234   PROTEINUR 30 (A) 05/01/2016 2234   NITRITE NEGATIVE 05/01/2016 2234   LEUKOCYTESUR NEGATIVE 05/01/2016 2234   Sepsis Labs Invalid input(s): PROCALCITONIN,  WBC,  LACTICIDVEN Microbiology Recent Results (from the past 240 hour(s))  MRSA PCR Screening     Status: None   Collection Time: 05/02/16  6:32 AM  Result Value Ref Range Status   MRSA by PCR NEGATIVE NEGATIVE Final    Comment:        The GeneXpert MRSA Assay (FDA approved for NASAL specimens only), is one component of a comprehensive MRSA colonization surveillance program. It is not intended to diagnose MRSA infection nor to guide or monitor treatment for MRSA infections.   MRSA PCR Screening     Status: None   Collection Time: 05/03/16  3:53 PM  Result Value Ref Range Status   MRSA by PCR NEGATIVE NEGATIVE Final    Comment:        The GeneXpert MRSA Assay (FDA approved for NASAL specimens only), is one component of a comprehensive MRSA colonization surveillance program. It is not intended to diagnose MRSA infection nor to guide or monitor treatment for MRSA infections.     Time coordinating discharge: Over 30 minutes  SIGNED:  Chipper Oman, MD  [facility] 05/06/2016, 12:56 PM Pager   If 7PM-7AM, please contact night-coverage [email or web address] Password [email or web address]

## 2016-08-15 DIAGNOSIS — K7469 Other cirrhosis of liver: Secondary | ICD-10-CM | POA: Diagnosis not present

## 2016-08-16 DIAGNOSIS — N5201 Erectile dysfunction due to arterial insufficiency: Secondary | ICD-10-CM | POA: Diagnosis not present

## 2016-08-16 DIAGNOSIS — N401 Enlarged prostate with lower urinary tract symptoms: Secondary | ICD-10-CM | POA: Diagnosis not present

## 2016-08-16 DIAGNOSIS — R351 Nocturia: Secondary | ICD-10-CM | POA: Diagnosis not present

## 2016-08-31 ENCOUNTER — Other Ambulatory Visit: Payer: Self-pay | Admitting: Nurse Practitioner

## 2016-08-31 DIAGNOSIS — K7469 Other cirrhosis of liver: Secondary | ICD-10-CM | POA: Diagnosis not present

## 2016-09-15 ENCOUNTER — Other Ambulatory Visit: Payer: BC Managed Care – PPO

## 2016-09-21 ENCOUNTER — Ambulatory Visit
Admission: RE | Admit: 2016-09-21 | Discharge: 2016-09-21 | Disposition: A | Discharge status: Home / Self Care | Payer: BC Managed Care – PPO | Source: Ambulatory Visit | Attending: Nurse Practitioner | Admitting: Nurse Practitioner

## 2016-09-21 DIAGNOSIS — K7460 Unspecified cirrhosis of liver: Secondary | ICD-10-CM | POA: Diagnosis not present

## 2016-09-21 DIAGNOSIS — K7469 Other cirrhosis of liver: Secondary | ICD-10-CM

## 2016-11-03 DIAGNOSIS — B182 Chronic viral hepatitis C: Secondary | ICD-10-CM | POA: Diagnosis not present

## 2016-11-03 DIAGNOSIS — I2699 Other pulmonary embolism without acute cor pulmonale: Secondary | ICD-10-CM | POA: Diagnosis not present

## 2016-11-03 DIAGNOSIS — R7303 Prediabetes: Secondary | ICD-10-CM | POA: Diagnosis not present

## 2016-11-03 DIAGNOSIS — I10 Essential (primary) hypertension: Secondary | ICD-10-CM | POA: Diagnosis not present

## 2016-11-03 DIAGNOSIS — N400 Enlarged prostate without lower urinary tract symptoms: Secondary | ICD-10-CM | POA: Diagnosis not present

## 2016-12-06 DIAGNOSIS — M438X9 Other specified deforming dorsopathies, site unspecified: Secondary | ICD-10-CM | POA: Diagnosis not present

## 2016-12-06 DIAGNOSIS — M545 Low back pain: Secondary | ICD-10-CM | POA: Diagnosis not present

## 2017-03-08 DIAGNOSIS — Z23 Encounter for immunization: Secondary | ICD-10-CM | POA: Diagnosis not present

## 2017-03-13 DIAGNOSIS — K7469 Other cirrhosis of liver: Secondary | ICD-10-CM | POA: Diagnosis not present

## 2017-03-14 ENCOUNTER — Other Ambulatory Visit: Payer: Self-pay | Admitting: Nurse Practitioner

## 2017-03-14 DIAGNOSIS — K7469 Other cirrhosis of liver: Secondary | ICD-10-CM

## 2017-03-23 ENCOUNTER — Ambulatory Visit
Admission: RE | Admit: 2017-03-23 | Discharge: 2017-03-23 | Disposition: A | Discharge status: Home / Self Care | Payer: Medicare PPO | Source: Ambulatory Visit | Attending: Nurse Practitioner | Admitting: Nurse Practitioner

## 2017-03-23 DIAGNOSIS — K7469 Other cirrhosis of liver: Secondary | ICD-10-CM

## 2017-03-23 DIAGNOSIS — K7460 Unspecified cirrhosis of liver: Secondary | ICD-10-CM | POA: Diagnosis not present

## 2017-06-08 DIAGNOSIS — N400 Enlarged prostate without lower urinary tract symptoms: Secondary | ICD-10-CM | POA: Diagnosis not present

## 2017-06-08 DIAGNOSIS — R739 Hyperglycemia, unspecified: Secondary | ICD-10-CM | POA: Diagnosis not present

## 2017-06-08 DIAGNOSIS — I10 Essential (primary) hypertension: Secondary | ICD-10-CM | POA: Diagnosis not present

## 2017-06-08 DIAGNOSIS — Z136 Encounter for screening for cardiovascular disorders: Secondary | ICD-10-CM | POA: Diagnosis not present

## 2017-06-08 DIAGNOSIS — Z23 Encounter for immunization: Secondary | ICD-10-CM | POA: Diagnosis not present

## 2017-06-08 DIAGNOSIS — R7303 Prediabetes: Secondary | ICD-10-CM | POA: Diagnosis not present

## 2017-06-08 DIAGNOSIS — F17210 Nicotine dependence, cigarettes, uncomplicated: Secondary | ICD-10-CM | POA: Diagnosis not present

## 2017-06-08 DIAGNOSIS — Z0000 Encounter for general adult medical examination without abnormal findings: Secondary | ICD-10-CM | POA: Diagnosis not present

## 2017-06-08 DIAGNOSIS — I2699 Other pulmonary embolism without acute cor pulmonale: Secondary | ICD-10-CM | POA: Diagnosis not present

## 2017-06-08 DIAGNOSIS — E669 Obesity, unspecified: Secondary | ICD-10-CM | POA: Diagnosis not present

## 2017-06-08 DIAGNOSIS — B182 Chronic viral hepatitis C: Secondary | ICD-10-CM | POA: Diagnosis not present

## 2017-08-01 DIAGNOSIS — N401 Enlarged prostate with lower urinary tract symptoms: Secondary | ICD-10-CM | POA: Diagnosis not present

## 2017-08-08 DIAGNOSIS — N401 Enlarged prostate with lower urinary tract symptoms: Secondary | ICD-10-CM | POA: Diagnosis not present

## 2017-08-08 DIAGNOSIS — R351 Nocturia: Secondary | ICD-10-CM | POA: Diagnosis not present

## 2017-09-28 ENCOUNTER — Other Ambulatory Visit: Payer: Self-pay | Admitting: Nurse Practitioner

## 2017-09-28 DIAGNOSIS — K7469 Other cirrhosis of liver: Secondary | ICD-10-CM | POA: Diagnosis not present

## 2017-10-13 ENCOUNTER — Ambulatory Visit
Admission: RE | Admit: 2017-10-13 | Discharge: 2017-10-13 | Disposition: A | Discharge status: Home / Self Care | Payer: Medicare PPO | Source: Ambulatory Visit | Attending: Nurse Practitioner | Admitting: Nurse Practitioner

## 2017-10-13 DIAGNOSIS — K7469 Other cirrhosis of liver: Secondary | ICD-10-CM

## 2017-10-13 DIAGNOSIS — K7460 Unspecified cirrhosis of liver: Secondary | ICD-10-CM | POA: Diagnosis not present

## 2017-11-06 DIAGNOSIS — Z125 Encounter for screening for malignant neoplasm of prostate: Secondary | ICD-10-CM | POA: Diagnosis not present

## 2017-11-07 DIAGNOSIS — Z125 Encounter for screening for malignant neoplasm of prostate: Secondary | ICD-10-CM | POA: Diagnosis not present

## 2017-11-14 DIAGNOSIS — R351 Nocturia: Secondary | ICD-10-CM | POA: Diagnosis not present

## 2017-11-14 DIAGNOSIS — R972 Elevated prostate specific antigen [PSA]: Secondary | ICD-10-CM | POA: Diagnosis not present

## 2017-11-14 DIAGNOSIS — N401 Enlarged prostate with lower urinary tract symptoms: Secondary | ICD-10-CM | POA: Diagnosis not present

## 2017-11-14 DIAGNOSIS — N5201 Erectile dysfunction due to arterial insufficiency: Secondary | ICD-10-CM | POA: Diagnosis not present

## 2017-12-11 DIAGNOSIS — I10 Essential (primary) hypertension: Secondary | ICD-10-CM | POA: Diagnosis not present

## 2017-12-11 DIAGNOSIS — N400 Enlarged prostate without lower urinary tract symptoms: Secondary | ICD-10-CM | POA: Diagnosis not present

## 2017-12-11 DIAGNOSIS — I2699 Other pulmonary embolism without acute cor pulmonale: Secondary | ICD-10-CM | POA: Diagnosis not present

## 2017-12-11 DIAGNOSIS — B182 Chronic viral hepatitis C: Secondary | ICD-10-CM | POA: Diagnosis not present

## 2017-12-11 DIAGNOSIS — R7303 Prediabetes: Secondary | ICD-10-CM | POA: Diagnosis not present

## 2018-04-05 DIAGNOSIS — K7469 Other cirrhosis of liver: Secondary | ICD-10-CM | POA: Diagnosis not present

## 2018-04-06 ENCOUNTER — Other Ambulatory Visit: Payer: Self-pay | Admitting: Nurse Practitioner

## 2018-04-06 DIAGNOSIS — K7469 Other cirrhosis of liver: Secondary | ICD-10-CM

## 2018-04-11 ENCOUNTER — Ambulatory Visit
Admission: RE | Admit: 2018-04-11 | Discharge: 2018-04-11 | Disposition: A | Discharge status: Home / Self Care | Payer: Medicare PPO | Source: Ambulatory Visit | Attending: Nurse Practitioner | Admitting: Nurse Practitioner

## 2018-04-11 DIAGNOSIS — K7460 Unspecified cirrhosis of liver: Secondary | ICD-10-CM | POA: Diagnosis not present

## 2018-04-11 DIAGNOSIS — K7469 Other cirrhosis of liver: Secondary | ICD-10-CM

## 2018-06-15 DIAGNOSIS — K7460 Unspecified cirrhosis of liver: Secondary | ICD-10-CM | POA: Diagnosis not present

## 2018-06-15 DIAGNOSIS — I10 Essential (primary) hypertension: Secondary | ICD-10-CM | POA: Diagnosis not present

## 2018-06-15 DIAGNOSIS — Z1389 Encounter for screening for other disorder: Secondary | ICD-10-CM | POA: Diagnosis not present

## 2018-06-15 DIAGNOSIS — I2699 Other pulmonary embolism without acute cor pulmonale: Secondary | ICD-10-CM | POA: Diagnosis not present

## 2018-06-15 DIAGNOSIS — Z0000 Encounter for general adult medical examination without abnormal findings: Secondary | ICD-10-CM | POA: Diagnosis not present

## 2018-06-15 DIAGNOSIS — K635 Polyp of colon: Secondary | ICD-10-CM | POA: Diagnosis not present

## 2018-06-15 DIAGNOSIS — B182 Chronic viral hepatitis C: Secondary | ICD-10-CM | POA: Diagnosis not present

## 2018-06-15 DIAGNOSIS — R7303 Prediabetes: Secondary | ICD-10-CM | POA: Diagnosis not present

## 2018-06-15 DIAGNOSIS — N400 Enlarged prostate without lower urinary tract symptoms: Secondary | ICD-10-CM | POA: Diagnosis not present

## 2018-06-15 DIAGNOSIS — F17210 Nicotine dependence, cigarettes, uncomplicated: Secondary | ICD-10-CM | POA: Diagnosis not present

## 2018-06-15 DIAGNOSIS — I714 Abdominal aortic aneurysm, without rupture: Secondary | ICD-10-CM | POA: Diagnosis not present

## 2018-10-18 DIAGNOSIS — K7469 Other cirrhosis of liver: Secondary | ICD-10-CM | POA: Diagnosis not present

## 2018-10-22 ENCOUNTER — Other Ambulatory Visit: Payer: Self-pay | Admitting: Nurse Practitioner

## 2018-10-22 DIAGNOSIS — K7469 Other cirrhosis of liver: Secondary | ICD-10-CM

## 2018-10-26 DIAGNOSIS — K7469 Other cirrhosis of liver: Secondary | ICD-10-CM | POA: Diagnosis not present

## 2018-11-02 ENCOUNTER — Ambulatory Visit
Admission: RE | Admit: 2018-11-02 | Discharge: 2018-11-02 | Disposition: A | Discharge status: Home / Self Care | Payer: Medicare PPO | Source: Ambulatory Visit | Attending: Nurse Practitioner | Admitting: Nurse Practitioner

## 2018-11-02 DIAGNOSIS — K824 Cholesterolosis of gallbladder: Secondary | ICD-10-CM | POA: Diagnosis not present

## 2018-11-02 DIAGNOSIS — K7469 Other cirrhosis of liver: Secondary | ICD-10-CM

## 2018-11-02 DIAGNOSIS — K7460 Unspecified cirrhosis of liver: Secondary | ICD-10-CM | POA: Diagnosis not present

## 2018-11-14 DIAGNOSIS — Z86010 Personal history of colonic polyps: Secondary | ICD-10-CM | POA: Diagnosis not present

## 2018-11-14 DIAGNOSIS — D120 Benign neoplasm of cecum: Secondary | ICD-10-CM | POA: Diagnosis not present

## 2018-11-14 DIAGNOSIS — D123 Benign neoplasm of transverse colon: Secondary | ICD-10-CM | POA: Diagnosis not present

## 2018-11-14 DIAGNOSIS — D122 Benign neoplasm of ascending colon: Secondary | ICD-10-CM | POA: Diagnosis not present

## 2018-11-14 DIAGNOSIS — D125 Benign neoplasm of sigmoid colon: Secondary | ICD-10-CM | POA: Diagnosis not present

## 2018-11-14 DIAGNOSIS — K5730 Diverticulosis of large intestine without perforation or abscess without bleeding: Secondary | ICD-10-CM | POA: Diagnosis not present

## 2018-11-16 DIAGNOSIS — D125 Benign neoplasm of sigmoid colon: Secondary | ICD-10-CM | POA: Diagnosis not present

## 2018-11-16 DIAGNOSIS — D122 Benign neoplasm of ascending colon: Secondary | ICD-10-CM | POA: Diagnosis not present

## 2018-11-16 DIAGNOSIS — D123 Benign neoplasm of transverse colon: Secondary | ICD-10-CM | POA: Diagnosis not present

## 2018-11-16 DIAGNOSIS — D120 Benign neoplasm of cecum: Secondary | ICD-10-CM | POA: Diagnosis not present

## 2018-11-29 DIAGNOSIS — N5201 Erectile dysfunction due to arterial insufficiency: Secondary | ICD-10-CM | POA: Diagnosis not present

## 2018-11-29 DIAGNOSIS — R972 Elevated prostate specific antigen [PSA]: Secondary | ICD-10-CM | POA: Diagnosis not present

## 2018-11-29 DIAGNOSIS — N401 Enlarged prostate with lower urinary tract symptoms: Secondary | ICD-10-CM | POA: Diagnosis not present

## 2018-11-29 DIAGNOSIS — R351 Nocturia: Secondary | ICD-10-CM | POA: Diagnosis not present

## 2018-12-13 DIAGNOSIS — B182 Chronic viral hepatitis C: Secondary | ICD-10-CM | POA: Diagnosis not present

## 2018-12-13 DIAGNOSIS — I2699 Other pulmonary embolism without acute cor pulmonale: Secondary | ICD-10-CM | POA: Diagnosis not present

## 2018-12-13 DIAGNOSIS — I714 Abdominal aortic aneurysm, without rupture: Secondary | ICD-10-CM | POA: Diagnosis not present

## 2018-12-13 DIAGNOSIS — I10 Essential (primary) hypertension: Secondary | ICD-10-CM | POA: Diagnosis not present

## 2018-12-13 DIAGNOSIS — N400 Enlarged prostate without lower urinary tract symptoms: Secondary | ICD-10-CM | POA: Diagnosis not present

## 2018-12-13 DIAGNOSIS — K7469 Other cirrhosis of liver: Secondary | ICD-10-CM | POA: Diagnosis not present

## 2019-04-03 DIAGNOSIS — K7469 Other cirrhosis of liver: Secondary | ICD-10-CM | POA: Diagnosis not present

## 2019-04-05 ENCOUNTER — Other Ambulatory Visit: Payer: Self-pay | Admitting: Nurse Practitioner

## 2019-04-05 DIAGNOSIS — K7469 Other cirrhosis of liver: Secondary | ICD-10-CM

## 2019-04-11 DIAGNOSIS — K7469 Other cirrhosis of liver: Secondary | ICD-10-CM | POA: Diagnosis not present

## 2019-04-12 ENCOUNTER — Ambulatory Visit
Admission: RE | Admit: 2019-04-12 | Discharge: 2019-04-12 | Disposition: A | Discharge status: Home / Self Care | Payer: Medicare PPO | Source: Ambulatory Visit | Attending: Nurse Practitioner | Admitting: Nurse Practitioner

## 2019-04-12 DIAGNOSIS — K7469 Other cirrhosis of liver: Secondary | ICD-10-CM

## 2019-04-12 DIAGNOSIS — K824 Cholesterolosis of gallbladder: Secondary | ICD-10-CM | POA: Diagnosis not present

## 2019-06-26 DIAGNOSIS — F17210 Nicotine dependence, cigarettes, uncomplicated: Secondary | ICD-10-CM | POA: Diagnosis not present

## 2019-06-26 DIAGNOSIS — Z0000 Encounter for general adult medical examination without abnormal findings: Secondary | ICD-10-CM | POA: Diagnosis not present

## 2019-06-26 DIAGNOSIS — K7460 Unspecified cirrhosis of liver: Secondary | ICD-10-CM | POA: Diagnosis not present

## 2019-06-26 DIAGNOSIS — I2699 Other pulmonary embolism without acute cor pulmonale: Secondary | ICD-10-CM | POA: Diagnosis not present

## 2019-06-26 DIAGNOSIS — K635 Polyp of colon: Secondary | ICD-10-CM | POA: Diagnosis not present

## 2019-06-26 DIAGNOSIS — B182 Chronic viral hepatitis C: Secondary | ICD-10-CM | POA: Diagnosis not present

## 2019-06-26 DIAGNOSIS — I714 Abdominal aortic aneurysm, without rupture: Secondary | ICD-10-CM | POA: Diagnosis not present

## 2019-06-26 DIAGNOSIS — N400 Enlarged prostate without lower urinary tract symptoms: Secondary | ICD-10-CM | POA: Diagnosis not present

## 2019-06-26 DIAGNOSIS — I10 Essential (primary) hypertension: Secondary | ICD-10-CM | POA: Diagnosis not present

## 2019-06-26 DIAGNOSIS — R7309 Other abnormal glucose: Secondary | ICD-10-CM | POA: Diagnosis not present

## 2019-06-26 DIAGNOSIS — Z1389 Encounter for screening for other disorder: Secondary | ICD-10-CM | POA: Diagnosis not present

## 2019-10-09 DIAGNOSIS — K7469 Other cirrhosis of liver: Secondary | ICD-10-CM | POA: Diagnosis not present

## 2019-10-09 DIAGNOSIS — Z8619 Personal history of other infectious and parasitic diseases: Secondary | ICD-10-CM | POA: Diagnosis not present

## 2019-10-10 ENCOUNTER — Other Ambulatory Visit: Payer: Self-pay | Admitting: Nurse Practitioner

## 2019-10-10 DIAGNOSIS — K7469 Other cirrhosis of liver: Secondary | ICD-10-CM

## 2019-10-17 ENCOUNTER — Ambulatory Visit
Admission: RE | Admit: 2019-10-17 | Discharge: 2019-10-17 | Disposition: A | Discharge status: Home / Self Care | Payer: Medicare PPO | Source: Ambulatory Visit | Attending: Nurse Practitioner | Admitting: Nurse Practitioner

## 2019-10-17 DIAGNOSIS — K7469 Other cirrhosis of liver: Secondary | ICD-10-CM

## 2019-10-17 DIAGNOSIS — K7460 Unspecified cirrhosis of liver: Secondary | ICD-10-CM | POA: Diagnosis not present

## 2019-12-06 DIAGNOSIS — D123 Benign neoplasm of transverse colon: Secondary | ICD-10-CM | POA: Diagnosis not present

## 2019-12-06 DIAGNOSIS — K648 Other hemorrhoids: Secondary | ICD-10-CM | POA: Diagnosis not present

## 2019-12-06 DIAGNOSIS — Z86010 Personal history of colonic polyps: Secondary | ICD-10-CM | POA: Diagnosis not present

## 2019-12-06 DIAGNOSIS — D122 Benign neoplasm of ascending colon: Secondary | ICD-10-CM | POA: Diagnosis not present

## 2019-12-11 DIAGNOSIS — D123 Benign neoplasm of transverse colon: Secondary | ICD-10-CM | POA: Diagnosis not present

## 2019-12-11 DIAGNOSIS — D122 Benign neoplasm of ascending colon: Secondary | ICD-10-CM | POA: Diagnosis not present

## 2019-12-26 DIAGNOSIS — I700 Atherosclerosis of aorta: Secondary | ICD-10-CM | POA: Diagnosis not present

## 2019-12-26 DIAGNOSIS — I2699 Other pulmonary embolism without acute cor pulmonale: Secondary | ICD-10-CM | POA: Diagnosis not present

## 2019-12-26 DIAGNOSIS — I10 Essential (primary) hypertension: Secondary | ICD-10-CM | POA: Diagnosis not present

## 2019-12-26 DIAGNOSIS — Z86010 Personal history of colonic polyps: Secondary | ICD-10-CM | POA: Diagnosis not present

## 2019-12-26 DIAGNOSIS — R7309 Other abnormal glucose: Secondary | ICD-10-CM | POA: Diagnosis not present

## 2019-12-26 DIAGNOSIS — K7460 Unspecified cirrhosis of liver: Secondary | ICD-10-CM | POA: Diagnosis not present

## 2019-12-26 DIAGNOSIS — N400 Enlarged prostate without lower urinary tract symptoms: Secondary | ICD-10-CM | POA: Diagnosis not present

## 2019-12-26 DIAGNOSIS — B182 Chronic viral hepatitis C: Secondary | ICD-10-CM | POA: Diagnosis not present

## 2019-12-26 DIAGNOSIS — F17210 Nicotine dependence, cigarettes, uncomplicated: Secondary | ICD-10-CM | POA: Diagnosis not present

## 2020-03-10 DIAGNOSIS — R972 Elevated prostate specific antigen [PSA]: Secondary | ICD-10-CM | POA: Diagnosis not present

## 2020-03-16 DIAGNOSIS — R351 Nocturia: Secondary | ICD-10-CM | POA: Diagnosis not present

## 2020-03-16 DIAGNOSIS — N401 Enlarged prostate with lower urinary tract symptoms: Secondary | ICD-10-CM | POA: Diagnosis not present

## 2020-03-16 DIAGNOSIS — N5201 Erectile dysfunction due to arterial insufficiency: Secondary | ICD-10-CM | POA: Diagnosis not present

## 2020-03-31 DIAGNOSIS — Z23 Encounter for immunization: Secondary | ICD-10-CM | POA: Diagnosis not present

## 2020-04-23 ENCOUNTER — Other Ambulatory Visit: Payer: Self-pay | Admitting: Nurse Practitioner

## 2020-04-23 DIAGNOSIS — K7469 Other cirrhosis of liver: Secondary | ICD-10-CM | POA: Diagnosis not present

## 2020-05-08 ENCOUNTER — Ambulatory Visit
Admission: RE | Admit: 2020-05-08 | Discharge: 2020-05-08 | Disposition: A | Discharge status: Home / Self Care | Payer: Medicare PPO | Source: Ambulatory Visit | Attending: Nurse Practitioner | Admitting: Nurse Practitioner

## 2020-05-08 DIAGNOSIS — K7469 Other cirrhosis of liver: Secondary | ICD-10-CM

## 2020-05-08 DIAGNOSIS — K7689 Other specified diseases of liver: Secondary | ICD-10-CM | POA: Diagnosis not present

## 2020-06-26 DIAGNOSIS — Z1389 Encounter for screening for other disorder: Secondary | ICD-10-CM | POA: Diagnosis not present

## 2020-06-26 DIAGNOSIS — I700 Atherosclerosis of aorta: Secondary | ICD-10-CM | POA: Diagnosis not present

## 2020-06-26 DIAGNOSIS — F17210 Nicotine dependence, cigarettes, uncomplicated: Secondary | ICD-10-CM | POA: Diagnosis not present

## 2020-06-26 DIAGNOSIS — I2699 Other pulmonary embolism without acute cor pulmonale: Secondary | ICD-10-CM | POA: Diagnosis not present

## 2020-06-26 DIAGNOSIS — K7460 Unspecified cirrhosis of liver: Secondary | ICD-10-CM | POA: Diagnosis not present

## 2020-06-26 DIAGNOSIS — R7309 Other abnormal glucose: Secondary | ICD-10-CM | POA: Diagnosis not present

## 2020-06-26 DIAGNOSIS — Z0000 Encounter for general adult medical examination without abnormal findings: Secondary | ICD-10-CM | POA: Diagnosis not present

## 2020-06-26 DIAGNOSIS — I10 Essential (primary) hypertension: Secondary | ICD-10-CM | POA: Diagnosis not present

## 2020-06-26 DIAGNOSIS — I714 Abdominal aortic aneurysm, without rupture: Secondary | ICD-10-CM | POA: Diagnosis not present

## 2020-07-03 DIAGNOSIS — I714 Abdominal aortic aneurysm, without rupture: Secondary | ICD-10-CM | POA: Diagnosis not present

## 2020-07-16 ENCOUNTER — Telehealth: Payer: Self-pay | Admitting: Hematology and Oncology

## 2020-07-16 NOTE — Telephone Encounter (Signed)
Received a new hem referral from Dr. Lysle Rubens for history of bilateral PE and DVT currently on anticoagulation-please evaluate for continuation of anticoagulation or can be stopped. Mr. Wyatt Berry returned my call and has been scheduled to see Dr. Chryl Heck on 3/9 at 10am. Pt aware to arrive 20 minutes early.

## 2020-07-29 ENCOUNTER — Other Ambulatory Visit: Payer: Self-pay

## 2020-07-29 ENCOUNTER — Telehealth: Payer: Self-pay | Admitting: Hematology and Oncology

## 2020-07-29 ENCOUNTER — Inpatient Hospital Stay: Payer: Medicare Other | Attending: Hematology and Oncology | Admitting: Hematology and Oncology

## 2020-07-29 ENCOUNTER — Inpatient Hospital Stay: Payer: Medicare Other

## 2020-07-29 ENCOUNTER — Encounter: Payer: Self-pay | Admitting: Hematology and Oncology

## 2020-07-29 VITALS — BP 181/82 | HR 105 | Temp 97.0°F | Resp 18 | Ht 74.0 in | Wt 248.6 lb

## 2020-07-29 DIAGNOSIS — I2602 Saddle embolus of pulmonary artery with acute cor pulmonale: Secondary | ICD-10-CM

## 2020-07-29 DIAGNOSIS — I82432 Acute embolism and thrombosis of left popliteal vein: Secondary | ICD-10-CM | POA: Diagnosis not present

## 2020-07-29 DIAGNOSIS — Z7901 Long term (current) use of anticoagulants: Secondary | ICD-10-CM | POA: Diagnosis not present

## 2020-07-29 DIAGNOSIS — I82412 Acute embolism and thrombosis of left femoral vein: Secondary | ICD-10-CM

## 2020-07-29 DIAGNOSIS — F17210 Nicotine dependence, cigarettes, uncomplicated: Secondary | ICD-10-CM

## 2020-07-29 DIAGNOSIS — I824Y2 Acute embolism and thrombosis of unspecified deep veins of left proximal lower extremity: Secondary | ICD-10-CM

## 2020-07-29 DIAGNOSIS — N400 Enlarged prostate without lower urinary tract symptoms: Secondary | ICD-10-CM | POA: Insufficient documentation

## 2020-07-29 DIAGNOSIS — I2699 Other pulmonary embolism without acute cor pulmonale: Secondary | ICD-10-CM | POA: Diagnosis not present

## 2020-07-29 LAB — ANTITHROMBIN III: AntiThromb III Func: 89 % (ref 75–120)

## 2020-07-29 NOTE — Progress Notes (Signed)
[facility] NOTE  Patient Care Team: Wenda Low, MD as PCP - General (Internal Medicine)  CHIEF COMPLAINTS/PURPOSE OF CONSULTATION:  History of DVT and PE in 2017  ASSESSMENT & PLAN:  No problem-specific Assessment & Plan notes found for this encounter.  No orders of the defined types were placed in this encounter.  1.  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  No concerning review of systems or physical examination findings today.  Since he only had one episode of unprovoked DVT/PE 1 could consider hypercoagulable work-up and if this is unremarkable, discontinue Xarelto and proceed with baby aspirin daily.  I have discussed that the risk of recurrence is higher than general population who has never had a DVT/PE.  We have discussed the risk factors for pulmonary embolism, symptoms and signs of DVT/PE and the need to go to the hospital with any concerns once he discontinues Xarelto.  He is agreeable to all these recommendations.  He will proceed with hypercoagulable work-up today, return to clinic in 2 to 3 weeks to review results and to discuss additional recommendations. 2.  BPH, follows up with urology. 3.  Nicotine abuse, light smoker, smoking cessation encouraged.  HISTORY OF PRESENTING ILLNESS:   Wyatt Berry 70 y.o. male is here because of history of DVT and PE and anticoagulation recommendations.  Mr. Lebow arrived to the appointment today by himself.  Mr. Nazir is a good historian.  Back in 2017, he had sudden onset chest discomfort and shortness of breath and hence went to the hospital by ambulance.  There were no prior provoking factors such as trauma, surgery, testosterone supplementation, long duration of remobilization etc.  When he arrived to the hospital, he had vascular ultrasound which showed subacute deep venous thrombosis  involving the femoral vein and popliteal vein of the left lower extremity. CT with PE protocol showed large bilateral pulmonary artery emboli involving the central pulmonary artery, extension into the lobar and segmental branches.  The largest clot burden involves the left lower lobe pulmonary arteries and associated focal left lower lobe pulmonary infarct.  There is CT evidence of right heart strain consistent with at least submassive PE. He started on Xarelto for anticoagulation since then and has been taking it as recommended.  He has no problems with Xarelto.  He however wonders if he has to take Xarelto indefinitely.  No family history of hypercoagulable disorders.  No family history of malignancies.  He is pretty active, healthy for the most part except for underlying benign prostatic hyperplasia and follows up with urology, takes Flomax.  Rest of the pertinent 10 point ROS unremarkable.  REVIEW OF SYSTEMS:   Constitutional: Denies fevers, chills or abnormal night sweats Eyes: Denies blurriness of vision, double vision or watery eyes Ears, nose, mouth, throat, and face: Denies mucositis or sore throat Respiratory: Denies cough, dyspnea or wheezes Cardiovascular: Denies palpitation, chest discomfort or lower extremity swelling Gastrointestinal:  Denies nausea, heartburn or change in bowel habits Skin: Denies abnormal skin rashes Lymphatics: Denies new lymphadenopathy or easy bruising Neurological:Denies numbness, tingling or new weaknesses Behavioral/Psych: Mood is stable, no new changes  All other systems were reviewed with the patient and are negative.  MEDICAL HISTORY:  Past Medical History:  Diagnosis Date  . Hepatitis    c  . Hypertension     SURGICAL HISTORY: History reviewed.  No pertinent surgical history.  SOCIAL HISTORY: Social History   Socioeconomic History  . Marital status: Single    Spouse name: Not on file  . Number of children: Not on file  . Years of  education: Not on file  . Highest education level: Not on file  Occupational History  . Not on file  Tobacco Use  . Smoking status: Current Every Day Smoker    Packs/day: 0.50    Types: Cigarettes  . Smokeless tobacco: Never Used  Substance and Sexual Activity  . Alcohol use: No  . Drug use: No  . Sexual activity: Not on file  Other Topics Concern  . Not on file  Social History Narrative  . Not on file   Social Determinants of Health   Financial Resource Strain: Not on file  Food Insecurity: Not on file  Transportation Needs: Not on file  Physical Activity: Not on file  Stress: Not on file  Social Connections: Not on file  Intimate Partner Violence: Not on file    FAMILY HISTORY: Family History  Problem Relation Age of Onset  . Diabetes Mother   . Congestive Heart Failure Mother   . Heart disease Father     ALLERGIES:  has No Known Allergies.  MEDICATIONS:  Current Outpatient Medications  Medication Sig Dispense Refill  . amLODipine (NORVASC) 2.5 MG tablet Take 1 tablet (2.5 mg total) by mouth daily. 30 tablet 0  . losartan-hydrochlorothiazide (HYZAAR) 100-12.5 MG per tablet Take 1 tablet by mouth daily.    . Multiple Vitamin (MULTIVITAMIN) tablet Take 1 tablet by mouth daily.    . rivaroxaban (XARELTO) 20 MG TABS tablet Take 1 tablet (20 mg total) by mouth daily with supper. 30 tablet 0  . tamsulosin (FLOMAX) 0.4 MG CAPS capsule Take 0.4 mg by mouth daily after supper.     No current facility-administered medications for this visit.     PHYSICAL EXAMINATION:  ECOG PERFORMANCE STATUS: 0 - Asymptomatic  Vitals:   07/29/20 1004  BP: (!) 181/82  Pulse: (!) 105  Resp: 18  Temp: (!) 97 F (36.1 C)  SpO2: 98%   Filed Weights   07/29/20 1004  Weight: 248 lb 9.6 oz (112.8 kg)    GENERAL:alert, no distress and comfortable SKIN: skin color, texture, turgor are normal, no rashes or significant lesions EYES: normal, conjunctiva are pink and non-injected,  sclera clear OROPHARYNX:no exudate, no erythema and lips, buccal mucosa, and tongue normal  NECK: supple, thyroid normal size, non-tender, without nodularity LYMPH:  no palpable lymphadenopathy in the cervical, axillary or inguinal LUNGS: clear to auscultation and percussion with normal breathing effort HEART: regular rate & rhythm and no murmurs and no lower extremity edema ABDOMEN:abdomen soft, non-tender and normal bowel sounds Musculoskeletal:no cyanosis of digits and no clubbing no asymmetrical lower extremity swelling noted PSYCH: alert & oriented x 3 with fluent speech NEURO: no focal motor/sensory deficits  LABORATORY DATA:  I have reviewed the data as listed Lab Results  Component Value Date   WBC 8.9 05/06/2016   HGB 12.0 (L) 05/06/2016   HCT 35.8 (L) 05/06/2016   MCV 86.5 05/06/2016   PLT 306 05/06/2016     Chemistry      Component Value Date/Time   NA 134 (L) 05/03/2016 0224   K 3.9 05/03/2016 0224   CL 102 05/03/2016 0224   CO2 21 (L) 05/03/2016 0224   BUN 8 05/03/2016 0224   CREATININE 0.97 05/03/2016 0224      Component  Value Date/Time   CALCIUM 8.5 (L) 05/03/2016 0224   ALKPHOS 61 05/01/2016 2303   AST 28 05/01/2016 2303   ALT 18 05/01/2016 2303   BILITOT 0.9 05/01/2016 2303     Reviewed imaging report from 2017  RADIOGRAPHIC STUDIES: I have personally reviewed the radiological images as listed and agreed with the findings in the report. No results found.  All questions were answered. The patient knows to call the clinic with any problems, questions or concerns. I spent 45 minutes in the care of this patient including H and P, review of records, counseling and coordination of care.     Benay Pike, MD 07/29/2020 10:19 AM

## 2020-07-29 NOTE — Telephone Encounter (Signed)
Scheduled appointment per 3/9 los. Spoke to patient who is aware of appointment date and time.

## 2020-07-30 LAB — PROTEIN C ACTIVITY: Protein C Activity: 88 % (ref 73–180)

## 2020-07-30 LAB — CARDIOLIPIN ANTIBODIES, IGG, IGM, IGA
Anticardiolipin IgA: <9 APL U/mL (ref 0–11)
Anticardiolipin IgG: <9 GPL U/mL (ref 0–14)
Anticardiolipin IgM: <9 MPL U/mL (ref 0–12)

## 2020-07-30 LAB — BETA-2-GLYCOPROTEIN I ABS, IGG/M/A
Beta-2 Glyco I IgG: <9 GPI IgG units (ref 0–20)
Beta-2-Glycoprotein I IgA: <9 GPI IgA units (ref 0–25)
Beta-2-Glycoprotein I IgM: <9 GPI IgM units (ref 0–32)

## 2020-07-30 LAB — PROTEIN S ACTIVITY: Protein S Activity: 136 % (ref 63–140)

## 2020-07-31 LAB — LUPUS ANTICOAGULANT PANEL
DRVVT: 85.1 s — ABNORMAL HIGH (ref 0.0–47.0)
PTT Lupus Anticoagulant: 42.7 s (ref 0.0–51.9)

## 2020-07-31 LAB — DRVVT CONFIRM: dRVVT Confirm: 1.3 ratio — ABNORMAL HIGH (ref 0.8–1.2)

## 2020-07-31 LAB — DRVVT MIX: dRVVT Mix: 71 s — ABNORMAL HIGH (ref 0.0–40.4)

## 2020-08-04 LAB — FACTOR 5 LEIDEN

## 2020-08-05 LAB — PROTHROMBIN GENE MUTATION

## 2020-08-06 LAB — HEXAGONAL PHOSPHOLIPID NEUTRALIZATION: Hexagonal Phospholipid Neutral: 0 s

## 2020-08-20 ENCOUNTER — Other Ambulatory Visit: Payer: Self-pay

## 2020-08-20 ENCOUNTER — Telehealth: Payer: Self-pay | Admitting: Hematology and Oncology

## 2020-08-20 ENCOUNTER — Encounter: Payer: Self-pay | Admitting: Hematology and Oncology

## 2020-08-20 ENCOUNTER — Inpatient Hospital Stay (HOSPITAL_BASED_OUTPATIENT_CLINIC_OR_DEPARTMENT_OTHER): Payer: Medicare Other | Admitting: Hematology and Oncology

## 2020-08-20 VITALS — BP 155/90 | HR 105 | Temp 96.9°F | Resp 20 | Ht 74.0 in | Wt 248.9 lb

## 2020-08-20 DIAGNOSIS — Z7901 Long term (current) use of anticoagulants: Secondary | ICD-10-CM | POA: Diagnosis not present

## 2020-08-20 DIAGNOSIS — I2602 Saddle embolus of pulmonary artery with acute cor pulmonale: Secondary | ICD-10-CM

## 2020-08-20 DIAGNOSIS — I824Y2 Acute embolism and thrombosis of unspecified deep veins of left proximal lower extremity: Secondary | ICD-10-CM

## 2020-08-20 DIAGNOSIS — I82412 Acute embolism and thrombosis of left femoral vein: Secondary | ICD-10-CM | POA: Diagnosis not present

## 2020-08-20 DIAGNOSIS — I82432 Acute embolism and thrombosis of left popliteal vein: Secondary | ICD-10-CM | POA: Diagnosis not present

## 2020-08-20 DIAGNOSIS — F17210 Nicotine dependence, cigarettes, uncomplicated: Secondary | ICD-10-CM | POA: Diagnosis not present

## 2020-08-20 DIAGNOSIS — I2699 Other pulmonary embolism without acute cor pulmonale: Secondary | ICD-10-CM | POA: Diagnosis not present

## 2020-08-20 NOTE — Progress Notes (Signed)
[facility] NOTE  Patient Care Team: Wenda Low, MD as PCP - General (Internal Medicine)  CHIEF COMPLAINTS/PURPOSE OF CONSULTATION:  History of DVT and PE in 2017  ASSESSMENT & PLAN:   No problem-specific Assessment & Plan notes found for this encounter.  No orders of the defined types were placed in this encounter.  1.  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 He was referred to hematology for anticoagulation recommendations. He has been doing very well without any major complaints since his last visit. During his last visit, we discussed about hypercoagulable work up. He is here to discuss results from hypercoagulable work up. I have reviewed labs, no concern for hypercoagulable disorder, lupus anticoagulant is likely falsely positive because of concomitant use of xarelto. He denies any chest pain, SOB, leg swelling today. Since there is no clear evidence of hypercoagulable disorder and since he only had one episode of PE, we agreed that he can discontinue anticoagulation and consider baby aspirin daily We will repeat LA in a month since he will be off of Xarelto. He understands the symptoms and signs of DVT/PE and risk factors for DVT/PE Encouraged to stay active, hydrated, avoid testosterone supplementation and discuss with his surgeon about the history of PE, if he were to undergo surgery He expressed understanding. He understands that risk of recurrence is about 5% given unprovoked PE. Telephone visit in 4 weeks.   2.  BPH, follows up with urology. Recommended annual prostate exam.  3.  Nicotine abuse, light smoker, smoking cessation encouraged.  HISTORY OF PRESENTING ILLNESS:   Wyatt Berry 70 y.o. male is here because of history of DVT and PE and anticoagulation recommendations.  [name] arrived to the appointment today by  himself.  Mr. Bluestein is a good historian.  Back in 2017, he had sudden onset chest discomfort and shortness of breath and hence went to the hospital by ambulance.  There were no prior provoking factors such as trauma, surgery, testosterone supplementation, long duration of remobilization etc.  When he arrived to the hospital, he had vascular ultrasound which showed subacute deep venous thrombosis involving the femoral vein and popliteal vein of the left lower extremity. CT with PE protocol showed large bilateral pulmonary artery emboli involving the central pulmonary artery, extension into the lobar and segmental branches.  The largest clot burden involves the left lower lobe pulmonary arteries and associated focal left lower lobe pulmonary infarct.  There is CT evidence of right heart strain consistent with at least submassive PE. He started on Xarelto for anticoagulation since then and has been taking it as recommended.  He has no problems with Xarelto.  He however wonders if he has to take Xarelto indefinitely.  No family history of hypercoagulable disorders.  No family history of malignancies.  He is pretty active, healthy for the most part except for underlying benign prostatic hyperplasia and follows up with urology, takes Flomax.    Interval History  He is doing very well today. No complaints. Wearing his stockings most of the day No change in breathing No complaints with xarelto. He says Xarelto is expensive and he doesn't want to take it if he doesn't absolutely has to.   REVIEW OF SYSTEMS:   Constitutional: Denies fevers, chills or abnormal night sweats Eyes: Denies blurriness of vision, double vision or watery eyes Ears, nose,  mouth, throat, and face: Denies mucositis or sore throat Respiratory: Denies cough, dyspnea or wheezes Cardiovascular: Denies palpitation, chest discomfort or lower extremity swelling Gastrointestinal:  Denies nausea, heartburn or change in bowel habits Skin:  Denies abnormal skin rashes Lymphatics: Denies new lymphadenopathy or easy bruising Neurological:Denies numbness, tingling or new weaknesses Behavioral/Psych: Mood is stable, no new changes  All other systems were reviewed with the patient and are negative.  MEDICAL HISTORY:  Past Medical History:  Diagnosis Date  . Hepatitis    c  . Hypertension     SURGICAL HISTORY: No past surgical history on file.  SOCIAL HISTORY: Social History   Socioeconomic History  . Marital status: Single    Spouse name: Not on file  . Number of children: Not on file  . Years of education: Not on file  . Highest education level: Not on file  Occupational History  . Not on file  Tobacco Use  . Smoking status: Current Every Day Smoker    Packs/day: 0.50    Types: Cigarettes  . Smokeless tobacco: Never Used  Vaping Use  . Vaping Use: Never used  Substance and Sexual Activity  . Alcohol use: No  . Drug use: No  . Sexual activity: Not on file  Other Topics Concern  . Not on file  Social History Narrative  . Not on file   Social Determinants of Health   Financial Resource Strain: Not on file  Food Insecurity: Not on file  Transportation Needs: Not on file  Physical Activity: Not on file  Stress: Not on file  Social Connections: Not on file  Intimate Partner Violence: Not on file    FAMILY HISTORY: Family History  Problem Relation Age of Onset  . Diabetes Mother   . Congestive Heart Failure Mother   . Heart disease Father     ALLERGIES:  has No Known Allergies.  MEDICATIONS:  Current Outpatient Medications  Medication Sig Dispense Refill  . amLODipine (NORVASC) 2.5 MG tablet Take 1 tablet (2.5 mg total) by mouth daily. 30 tablet 0  . losartan-hydrochlorothiazide (HYZAAR) 100-12.5 MG per tablet Take 1 tablet by mouth daily.    . Multiple Vitamin (MULTIVITAMIN) tablet Take 1 tablet by mouth daily.    . rivaroxaban (XARELTO) 20 MG TABS tablet Take 1 tablet (20 mg total) by mouth  daily with supper. 30 tablet 0  . tamsulosin (FLOMAX) 0.4 MG CAPS capsule Take 0.4 mg by mouth daily after supper.     No current facility-administered medications for this visit.     PHYSICAL EXAMINATION:  ECOG PERFORMANCE STATUS: 0 - Asymptomatic  Vitals:   08/20/20 0929  BP: (!) 155/90  Pulse: (!) 105  Resp: 20  Temp: (!) 96.9 F (36.1 C)  SpO2: 99%   Filed Weights   08/20/20 0929  Weight: 248 lb 14.4 oz (112.9 kg)    GENERAL:alert, no distress and comfortable SKIN: skin color, texture, turgor are normal, no rashes or significant lesions EYES: normal, conjunctiva are pink and non-injected, sclera clear OROPHARYNX:no exudate, no erythema and lips, buccal mucosa, and tongue normal  NECK: supple, thyroid normal size, non-tender, without nodularity LYMPH:  no palpable lymphadenopathy in the cervical, axillary or inguinal LUNGS: clear to auscultation and percussion with normal breathing effort HEART: regular rate & rhythm and no murmurs and no lower extremity edema ABDOMEN:abdomen soft, non-tender and normal bowel sounds Musculoskeletal:no cyanosis of digits and no clubbing no asymmetrical lower extremity swelling noted PSYCH: alert & oriented x 3 with  fluent speech NEURO: no focal motor/sensory deficits  LABORATORY DATA:  I have reviewed the data as listed Lab Results  Component Value Date   WBC 8.9 05/06/2016   HGB 12.0 (L) 05/06/2016   HCT 35.8 (L) 05/06/2016   MCV 86.5 05/06/2016   PLT 306 05/06/2016     Chemistry      Component Value Date/Time   NA 134 (L) 05/03/2016 0224   K 3.9 05/03/2016 0224   CL 102 05/03/2016 0224   CO2 21 (L) 05/03/2016 0224   BUN 8 05/03/2016 0224   CREATININE 0.97 05/03/2016 0224      Component Value Date/Time   CALCIUM 8.5 (L) 05/03/2016 0224   ALKPHOS 61 05/01/2016 2303   AST 28 05/01/2016 2303   ALT 18 05/01/2016 2303   BILITOT 0.9 05/01/2016 2303     I have reviewed labs from last visit. Abnormal LA, otherwise  unremarkable.  RADIOGRAPHIC STUDIES: I have personally reviewed the radiological images as listed and agreed with the findings in the report. No results found.  All questions were answered. The patient knows to call the clinic with any problems, questions or concerns. I spent 30 minutes in the care of this patient including H and P, review of records, counseling and coordination of care.     Benay Pike, MD 08/20/2020 9:48 AM

## 2020-08-20 NOTE — Telephone Encounter (Signed)
Scheduled per los. Gave avs and calendar  

## 2020-09-18 ENCOUNTER — Other Ambulatory Visit: Payer: Self-pay

## 2020-09-18 ENCOUNTER — Inpatient Hospital Stay: Payer: Medicare Other | Attending: Hematology and Oncology

## 2020-09-18 DIAGNOSIS — Z86718 Personal history of other venous thrombosis and embolism: Secondary | ICD-10-CM | POA: Insufficient documentation

## 2020-09-18 DIAGNOSIS — I824Y2 Acute embolism and thrombosis of unspecified deep veins of left proximal lower extremity: Secondary | ICD-10-CM

## 2020-09-18 DIAGNOSIS — I2602 Saddle embolus of pulmonary artery with acute cor pulmonale: Secondary | ICD-10-CM

## 2020-09-18 DIAGNOSIS — Z86711 Personal history of pulmonary embolism: Secondary | ICD-10-CM | POA: Diagnosis not present

## 2020-09-18 LAB — CBC WITH DIFFERENTIAL/PLATELET
Abs Immature Granulocytes: 0.01 10*3/uL (ref 0.00–0.07)
Basophils Absolute: 0 10*3/uL (ref 0.0–0.1)
Basophils Relative: 0 %
Eosinophils Absolute: 0 10*3/uL (ref 0.0–0.5)
Eosinophils Relative: 1 %
HCT: 41.2 % (ref 39.0–52.0)
Hemoglobin: 13.2 g/dL (ref 13.0–17.0)
Immature Granulocytes: 0 %
Lymphocytes Relative: 45 %
Lymphs Abs: 2.4 10*3/uL (ref 0.7–4.0)
MCH: 26.7 pg (ref 26.0–34.0)
MCHC: 32 g/dL (ref 30.0–36.0)
MCV: 83.4 fL (ref 80.0–100.0)
Monocytes Absolute: 0.4 10*3/uL (ref 0.1–1.0)
Monocytes Relative: 8 %
Neutro Abs: 2.5 10*3/uL (ref 1.7–7.7)
Neutrophils Relative %: 46 %
Platelets: 323 10*3/uL (ref 150–400)
RBC: 4.94 MIL/uL (ref 4.22–5.81)
RDW: 15.9 % — ABNORMAL HIGH (ref 11.5–15.5)
WBC: 5.3 10*3/uL (ref 4.0–10.5)
nRBC: 0 % (ref 0.0–0.2)

## 2020-09-19 LAB — LUPUS ANTICOAGULANT PANEL
DRVVT: 35.1 s (ref 0.0–47.0)
PTT Lupus Anticoagulant: 32.3 s (ref 0.0–51.9)

## 2020-09-24 ENCOUNTER — Inpatient Hospital Stay: Payer: Medicare Other | Attending: Hematology and Oncology | Admitting: Hematology and Oncology

## 2020-09-24 ENCOUNTER — Encounter: Payer: Self-pay | Admitting: Hematology and Oncology

## 2020-09-24 DIAGNOSIS — Z86711 Personal history of pulmonary embolism: Secondary | ICD-10-CM | POA: Diagnosis not present

## 2020-09-24 DIAGNOSIS — Z86718 Personal history of other venous thrombosis and embolism: Secondary | ICD-10-CM | POA: Diagnosis not present

## 2020-09-24 NOTE — Progress Notes (Signed)
[facility] CONSULT NOTE  Patient Care Team: Wenda Low, MD as PCP - General (Internal Medicine)  CHIEF COMPLAINTS/PURPOSE OF CONSULTATION:  History of DVT and PE in 2017  ASSESSMENT & PLAN:   History of pulmonary embolism Patient had history of unprovoked left lower extremity proximal vein DVT and bilateral PE in 2017 and he has remained on long-term anticoagulation with Xarelto for almost 4 and half years. We have performed hypercoagulable work-up which was unremarkable.   Given 1 episode of left lower extremity proximal DVT and acute PE although it was a large burden, since he completed over 4 years of anticoagulation, I do not see a reason for indefinite anticoagulation in this patient.  We have clearly discussed symptoms and signs of DVT/PE and the need to go to the nearest hospital with any concerning symptoms.  We have discussed risk factors and have encouraged him to stay active, hydrated, avoid any testosterone supplementation and discussed with his surgeon if he is in need of any major surgery about his history of PE.  He understands there is a risk of recurrent DVT/PE which is about 5% a year. He will return to clinic as needed at this point. He does not have any concerns for active malignancy.  Age-appropriate cancer screening advised.   No orders of the defined types were placed in this encounter.   HISTORY OF PRESENTING ILLNESS:   Wyatt Berry 70 y.o. male is here because of history of DVT and PE and anticoagulation recommendations.  Interval History  He is doing very well today.  He has been taking baby aspirin daily since our last conversation.  He denies any symptoms concerning for DVT or PE today.  He was just worried about the lab results. Rest of the pertinent review of systems negative.  MEDICAL HISTORY:  Past Medical History:  Diagnosis Date  . Hepatitis    c  . Hypertension     SURGICAL HISTORY: No past surgical history on  file.  SOCIAL HISTORY: Social History   Socioeconomic History  . Marital status: Single    Spouse name: Not on file  . Number of children: Not on file  . Years of education: Not on file  . Highest education level: Not on file  Occupational History  . Not on file  Tobacco Use  . Smoking status: Current Every Day Smoker    Packs/day: 0.50    Types: Cigarettes  . Smokeless tobacco: Never Used  Vaping Use  . Vaping Use: Never used  Substance and Sexual Activity  . Alcohol use: No  . Drug use: No  . Sexual activity: Not on file  Other Topics Concern  . Not on file  Social History Narrative  . Not on file   Social Determinants of Health   Financial Resource Strain: Not on file  Food Insecurity: Not on file  Transportation Needs: Not on file  Physical Activity: Not on file  Stress: Not on file  Social Connections: Not on file  Intimate Partner Violence: Not on file    FAMILY HISTORY: Family History  Problem Relation Age of Onset  . Diabetes Mother   . Congestive Heart Failure Mother   . Heart disease Father     ALLERGIES:  has No Known Allergies.  MEDICATIONS:  Current Outpatient Medications  Medication Sig Dispense Refill  . amLODipine (NORVASC) 2.5 MG tablet Take 1 tablet (2.5 mg total) by mouth daily. 30 tablet 0  . losartan-hydrochlorothiazide (HYZAAR) 100-12.5 MG per  tablet Take 1 tablet by mouth daily.    . Multiple Vitamin (MULTIVITAMIN) tablet Take 1 tablet by mouth daily.    . tamsulosin (FLOMAX) 0.4 MG CAPS capsule Take 0.4 mg by mouth daily after supper.     No current facility-administered medications for this visit.     PHYSICAL EXAMINATION:  ECOG PERFORMANCE STATUS: 0 - Asymptomatic  Vital signs and physical exam not done, telephone visit.  LABORATORY DATA:  I have reviewed the data as listed Lab Results  Component Value Date   WBC 5.3 09/18/2020   HGB 13.2 09/18/2020   HCT 41.2 09/18/2020   MCV 83.4 09/18/2020   PLT 323 09/18/2020      Chemistry      Component Value Date/Time   NA 134 (L) 05/03/2016 0224   K 3.9 05/03/2016 0224   CL 102 05/03/2016 0224   CO2 21 (L) 05/03/2016 0224   BUN 8 05/03/2016 0224   CREATININE 0.97 05/03/2016 0224      Component Value Date/Time   CALCIUM 8.5 (L) 05/03/2016 0224   ALKPHOS 61 05/01/2016 2303   AST 28 05/01/2016 2303   ALT 18 05/01/2016 2303   BILITOT 0.9 05/01/2016 2303     Repeat lupus anticoagulant testing off of Xarelto is normal.  RADIOGRAPHIC STUDIES: I have personally reviewed the radiological images as listed and agreed with the findings in the report. No results found.  All questions were answered. The patient knows to call the clinic with any problems, questions or concerns. I spent 9 minutes in the care of this patient including History, review of records, counseling and coordination of care.  I connected with  Wyatt Berry on 09/24/20 by a telephone application and verified that I am speaking with the correct person using two identifiers.   I discussed the limitations of evaluation and management by telemedicine. The patient expressed understanding and agreed to proceed.      Benay Pike, MD 09/24/2020 9:29 AM

## 2020-09-24 NOTE — Assessment & Plan Note (Addendum)
Patient had history of unprovoked left lower extremity proximal vein DVT and bilateral PE in 2017 and he has remained on long-term anticoagulation with Xarelto for almost 4 and half years. We have performed hypercoagulable work-up which was unremarkable.   Given 1 episode of left lower extremity proximal DVT and acute PE although it was a large burden, since he completed over 4 years of anticoagulation, I do not see a reason for indefinite anticoagulation in this patient.  We have clearly discussed symptoms and signs of DVT/PE and the need to go to the nearest hospital with any concerning symptoms.  We have discussed risk factors and have encouraged him to stay active, hydrated, avoid any testosterone supplementation and discussed with his surgeon if he is in need of any major surgery about his history of PE.  He understands there is a risk of recurrent DVT/PE which is about 5% a year. He will return to clinic as needed at this point. He does not have any concerns for active malignancy.  Age-appropriate cancer screening advised.

## 2020-10-22 DIAGNOSIS — K7469 Other cirrhosis of liver: Secondary | ICD-10-CM | POA: Diagnosis not present

## 2020-10-23 ENCOUNTER — Other Ambulatory Visit: Payer: Self-pay | Admitting: Nurse Practitioner

## 2020-10-23 DIAGNOSIS — K7469 Other cirrhosis of liver: Secondary | ICD-10-CM

## 2020-11-13 ENCOUNTER — Ambulatory Visit
Admission: RE | Admit: 2020-11-13 | Discharge: 2020-11-13 | Disposition: A | Discharge status: Home / Self Care | Payer: BC Managed Care – PPO | Source: Ambulatory Visit | Attending: Nurse Practitioner | Admitting: Nurse Practitioner

## 2020-11-13 DIAGNOSIS — K7469 Other cirrhosis of liver: Secondary | ICD-10-CM

## 2020-11-13 DIAGNOSIS — K7460 Unspecified cirrhosis of liver: Secondary | ICD-10-CM | POA: Diagnosis not present

## 2020-12-21 DIAGNOSIS — R7303 Prediabetes: Secondary | ICD-10-CM

## 2020-12-21 HISTORY — DX: Prediabetes: R73.03

## 2020-12-25 DIAGNOSIS — I700 Atherosclerosis of aorta: Secondary | ICD-10-CM | POA: Diagnosis not present

## 2020-12-25 DIAGNOSIS — R7303 Prediabetes: Secondary | ICD-10-CM | POA: Diagnosis not present

## 2020-12-25 DIAGNOSIS — K7460 Unspecified cirrhosis of liver: Secondary | ICD-10-CM | POA: Diagnosis not present

## 2020-12-25 DIAGNOSIS — I10 Essential (primary) hypertension: Secondary | ICD-10-CM | POA: Diagnosis not present

## 2020-12-25 DIAGNOSIS — I714 Abdominal aortic aneurysm, without rupture: Secondary | ICD-10-CM | POA: Diagnosis not present

## 2021-01-01 DIAGNOSIS — I714 Abdominal aortic aneurysm, without rupture, unspecified: Secondary | ICD-10-CM

## 2021-01-01 DIAGNOSIS — Z87891 Personal history of nicotine dependence: Secondary | ICD-10-CM | POA: Diagnosis not present

## 2021-01-01 HISTORY — DX: Abdominal aortic aneurysm, without rupture, unspecified: I71.40

## 2021-02-17 DIAGNOSIS — D123 Benign neoplasm of transverse colon: Secondary | ICD-10-CM | POA: Diagnosis not present

## 2021-02-17 DIAGNOSIS — Z86010 Personal history of colonic polyps: Secondary | ICD-10-CM | POA: Diagnosis not present

## 2021-02-17 DIAGNOSIS — K5730 Diverticulosis of large intestine without perforation or abscess without bleeding: Secondary | ICD-10-CM | POA: Diagnosis not present

## 2021-02-19 DIAGNOSIS — D123 Benign neoplasm of transverse colon: Secondary | ICD-10-CM | POA: Diagnosis not present

## 2021-04-22 DIAGNOSIS — K7469 Other cirrhosis of liver: Secondary | ICD-10-CM | POA: Diagnosis not present

## 2021-04-23 ENCOUNTER — Other Ambulatory Visit: Payer: Self-pay | Admitting: Nurse Practitioner

## 2021-04-23 DIAGNOSIS — K7469 Other cirrhosis of liver: Secondary | ICD-10-CM

## 2021-05-06 ENCOUNTER — Ambulatory Visit
Admission: RE | Admit: 2021-05-06 | Discharge: 2021-05-06 | Disposition: A | Discharge status: Home / Self Care | Payer: Medicare Other | Source: Ambulatory Visit | Attending: Nurse Practitioner | Admitting: Nurse Practitioner

## 2021-05-06 DIAGNOSIS — K7460 Unspecified cirrhosis of liver: Secondary | ICD-10-CM | POA: Diagnosis not present

## 2021-05-06 DIAGNOSIS — K7469 Other cirrhosis of liver: Secondary | ICD-10-CM

## 2021-05-21 IMAGING — US ULTRASOUND ABDOMEN LIMITED
1 series · 14 of 25 positions shown · non-contrast
Comparison: 10/09/02

CLINICAL DATA: Follow-up cirrhosis

EXAM:
ULTRASOUND ABDOMEN LIMITED RIGHT UPPER QUADRANT

[Series 1: ultrasound abdomen limited · 0.14mm/px · 14 of 50 slices shown]
[im 1/50]
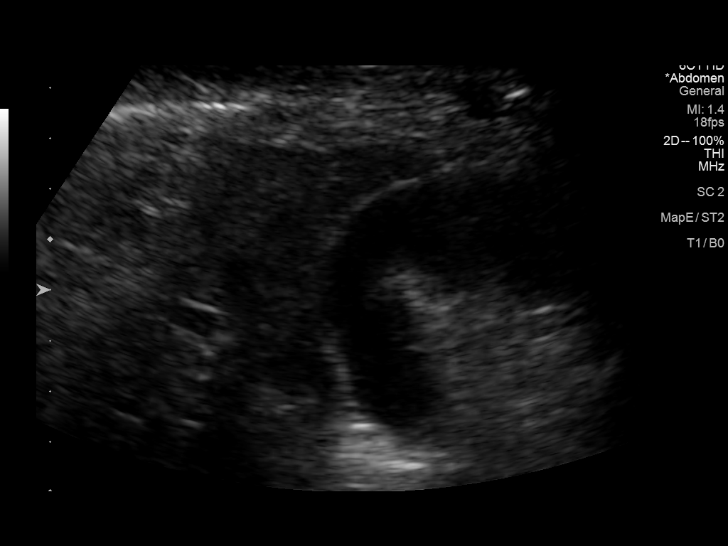
[im 5/50]
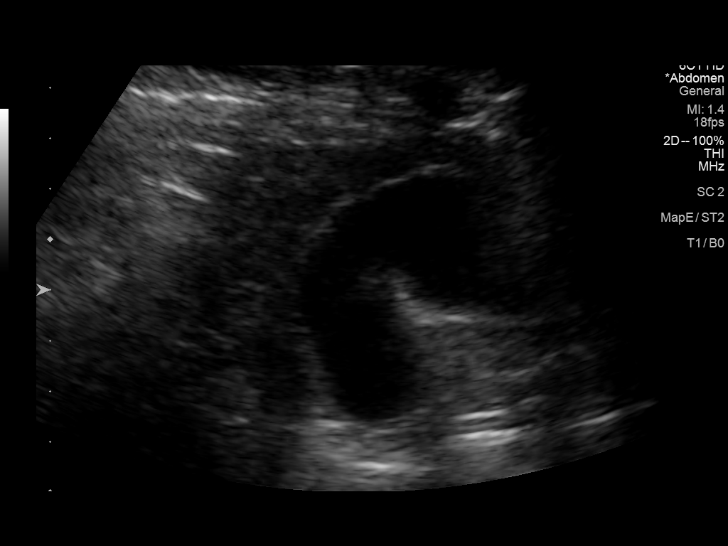
[im 9/50]
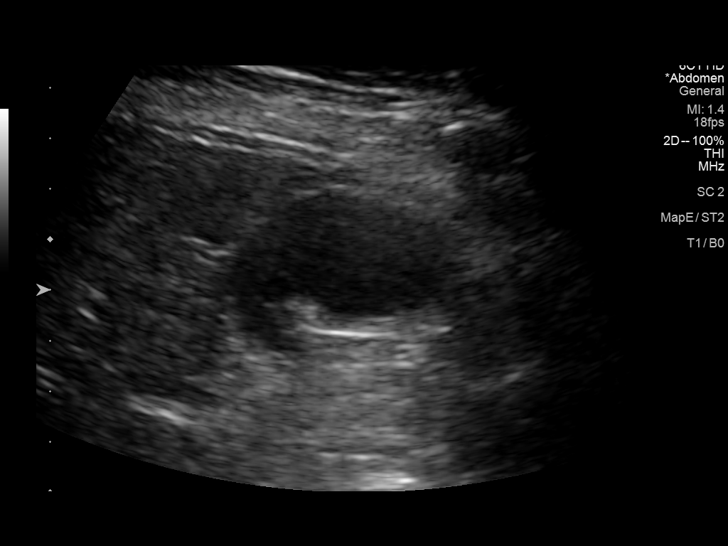
[im 13/50]
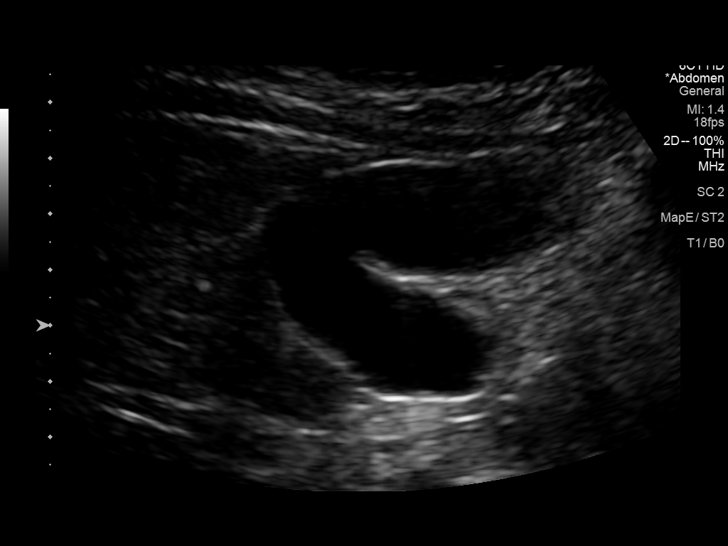
[im 17/50]
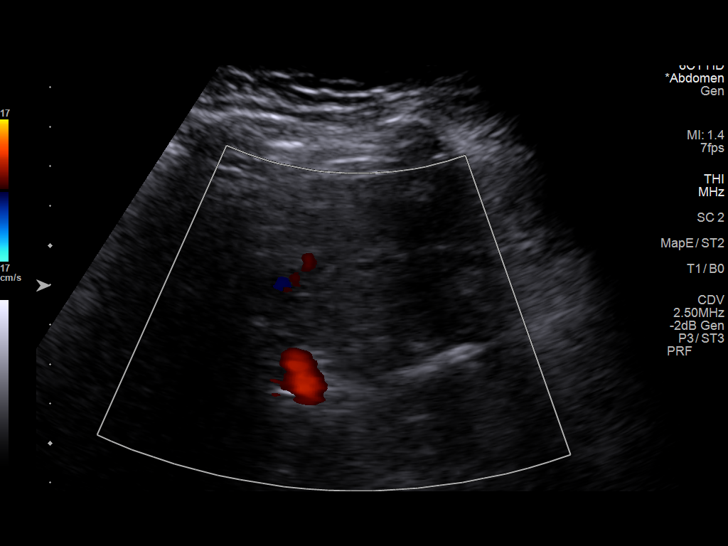
[im 19/50]
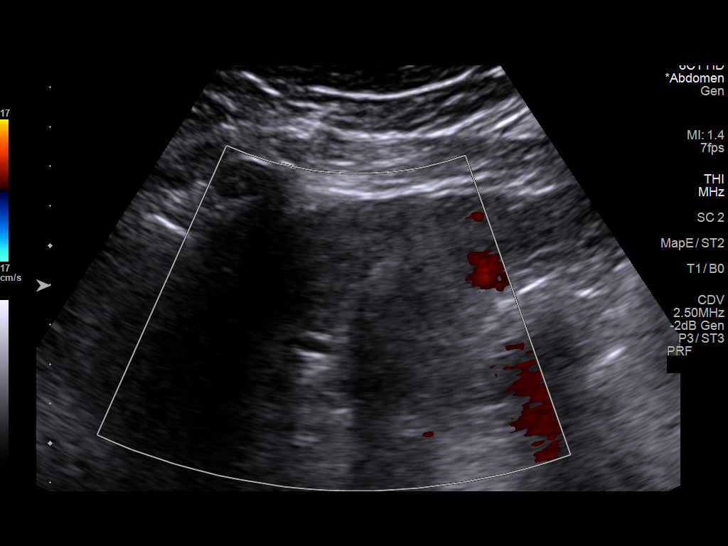
[im 23/50]
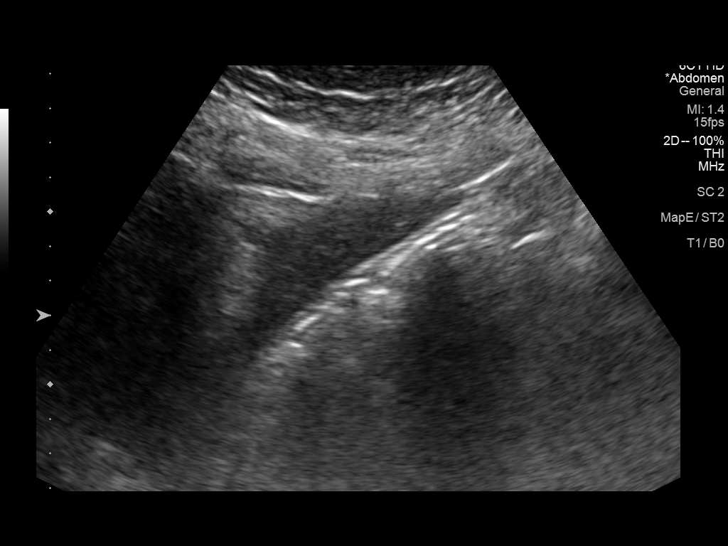
[im 27/50]
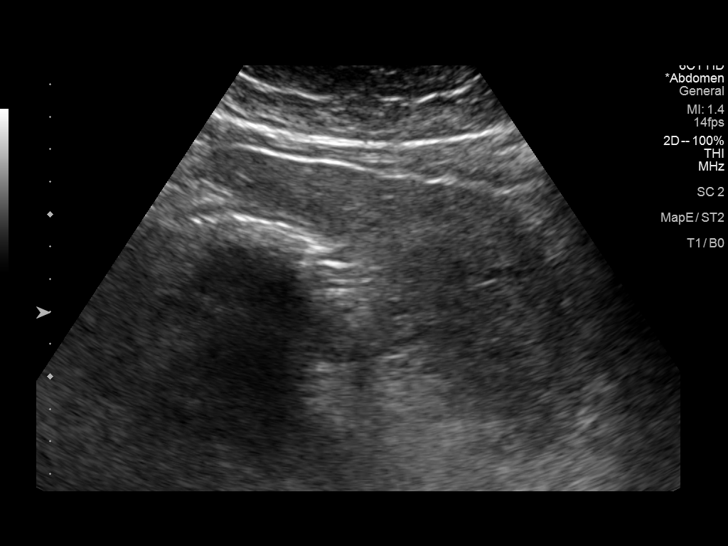
[im 31/50]
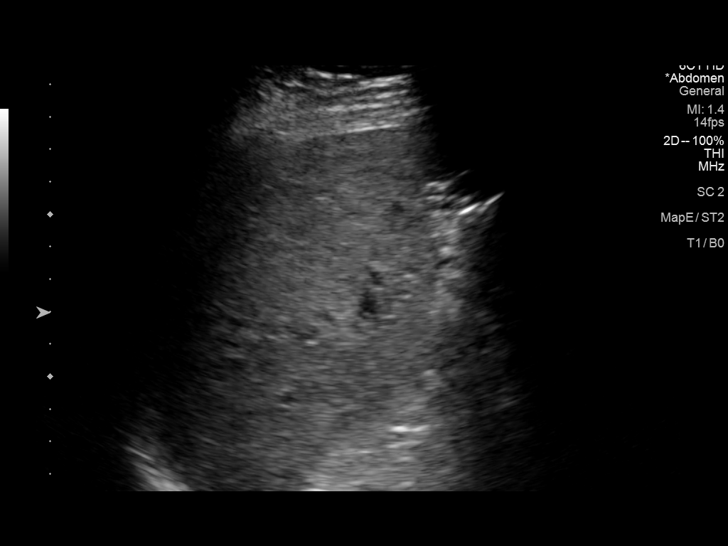
[im 33/50]
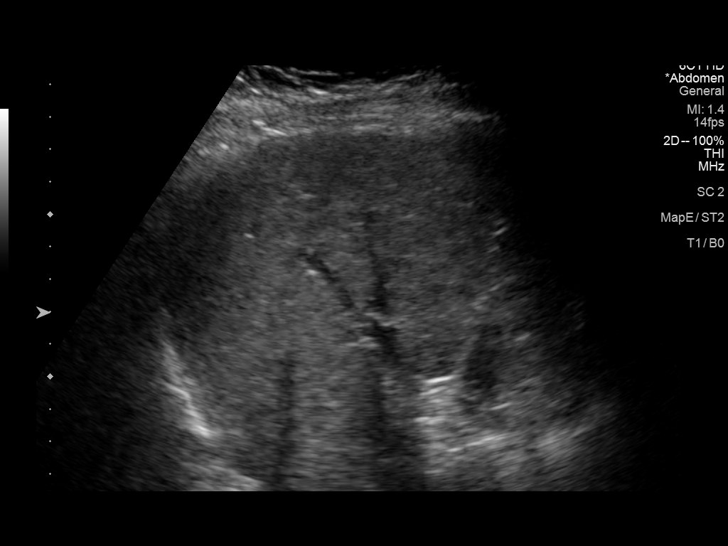
[im 37/50]
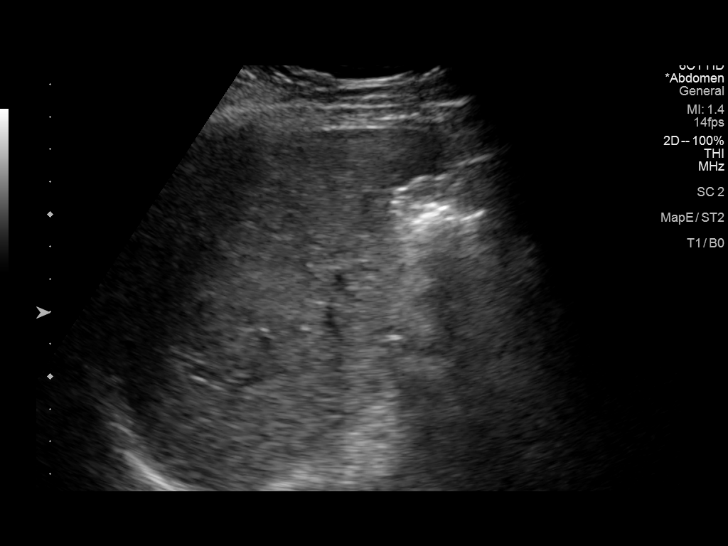
[im 41/50]
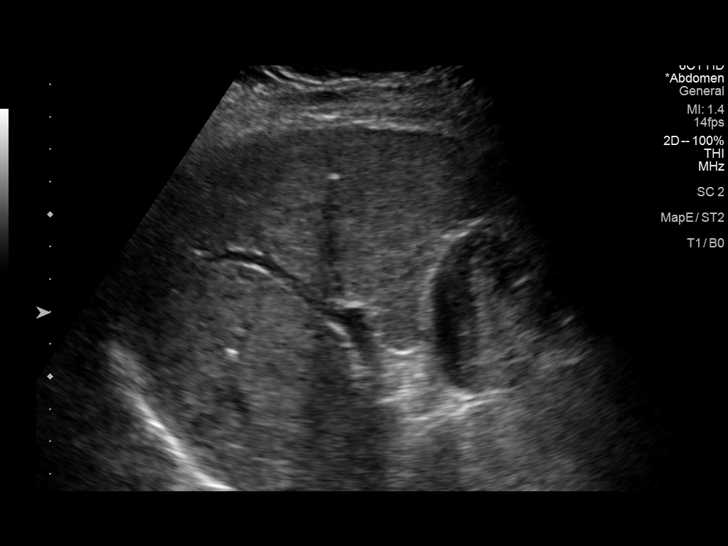
[im 45/50]
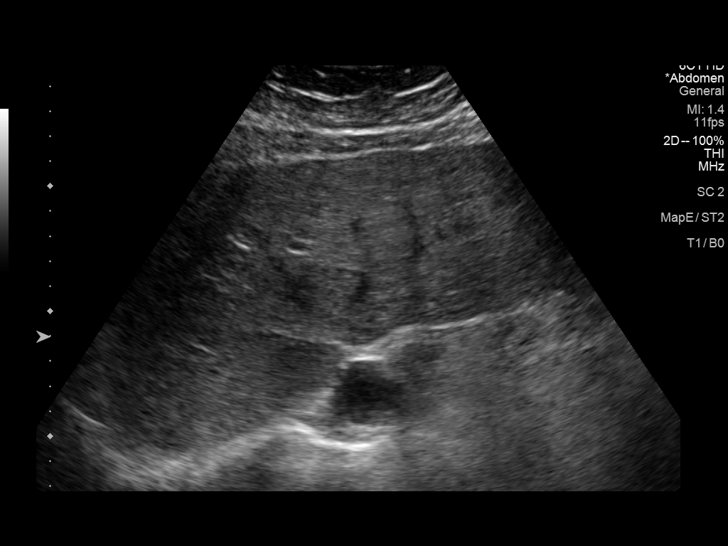
[im 50/50]
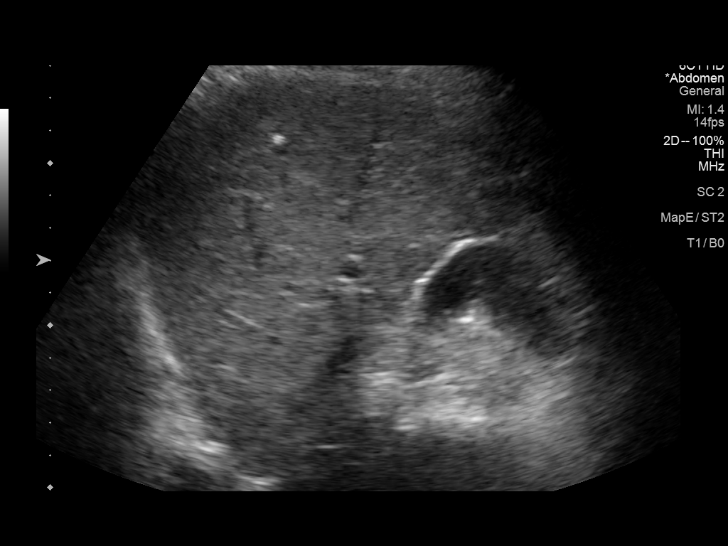

[14 of 25 positions shown; findings below may reference images not displayed]

FINDINGS: Gallbladder:

Well distended with gallbladder polyps. No gallstones are seen. No
pericholecystic fluid is noted.

Common bile duct:

Diameter: 2.6 mm.

Liver:

Heterogeneous and diffusely increased echogenicity throughout the
liver without focal mass. Nodular contour is noted as well. These
changes are consistent with cirrhosis. Portal vein is patent on
color Doppler imaging with normal direction of blood flow towards
the liver.
IMPRESSION: Changes of cirrhosis stable from the previous exam. No new focal
abnormality is noted.

Gallbladder polyps.

## 2021-06-28 DIAGNOSIS — R7303 Prediabetes: Secondary | ICD-10-CM | POA: Diagnosis not present

## 2021-06-28 DIAGNOSIS — I10 Essential (primary) hypertension: Secondary | ICD-10-CM | POA: Diagnosis not present

## 2021-06-28 DIAGNOSIS — Z1389 Encounter for screening for other disorder: Secondary | ICD-10-CM | POA: Diagnosis not present

## 2021-06-28 DIAGNOSIS — K7460 Unspecified cirrhosis of liver: Secondary | ICD-10-CM | POA: Diagnosis not present

## 2021-06-28 DIAGNOSIS — I714 Abdominal aortic aneurysm, without rupture, unspecified: Secondary | ICD-10-CM | POA: Diagnosis not present

## 2021-06-28 DIAGNOSIS — I700 Atherosclerosis of aorta: Secondary | ICD-10-CM | POA: Diagnosis not present

## 2021-06-28 DIAGNOSIS — F17210 Nicotine dependence, cigarettes, uncomplicated: Secondary | ICD-10-CM | POA: Diagnosis not present

## 2021-06-28 DIAGNOSIS — Z0000 Encounter for general adult medical examination without abnormal findings: Secondary | ICD-10-CM | POA: Diagnosis not present

## 2021-06-28 DIAGNOSIS — Z86711 Personal history of pulmonary embolism: Secondary | ICD-10-CM | POA: Diagnosis not present

## 2021-10-21 ENCOUNTER — Other Ambulatory Visit: Payer: Self-pay | Admitting: Nurse Practitioner

## 2021-10-21 DIAGNOSIS — K7469 Other cirrhosis of liver: Secondary | ICD-10-CM

## 2021-10-21 DIAGNOSIS — I499 Cardiac arrhythmia, unspecified: Secondary | ICD-10-CM | POA: Diagnosis not present

## 2021-10-27 ENCOUNTER — Ambulatory Visit
Admission: RE | Admit: 2021-10-27 | Discharge: 2021-10-27 | Disposition: A | Discharge status: Home / Self Care | Payer: Medicare Other | Source: Ambulatory Visit | Attending: Nurse Practitioner | Admitting: Nurse Practitioner

## 2021-10-27 ENCOUNTER — Other Ambulatory Visit: Payer: Medicare Other

## 2021-10-27 DIAGNOSIS — K7460 Unspecified cirrhosis of liver: Secondary | ICD-10-CM | POA: Diagnosis not present

## 2021-10-27 DIAGNOSIS — K7469 Other cirrhosis of liver: Secondary | ICD-10-CM

## 2021-10-28 DIAGNOSIS — I10 Essential (primary) hypertension: Secondary | ICD-10-CM | POA: Diagnosis not present

## 2021-10-28 DIAGNOSIS — I499 Cardiac arrhythmia, unspecified: Secondary | ICD-10-CM | POA: Diagnosis not present

## 2021-10-28 DIAGNOSIS — K7460 Unspecified cirrhosis of liver: Secondary | ICD-10-CM | POA: Diagnosis not present

## 2021-10-28 DIAGNOSIS — I700 Atherosclerosis of aorta: Secondary | ICD-10-CM | POA: Diagnosis not present

## 2021-10-29 IMAGING — US US ABDOMEN LIMITED
1 series · 13 of 25 positions shown · non-contrast
Comparison: November 02, 2018

CLINICAL DATA: Hepatic cirrhosis

EXAM:
ULTRASOUND ABDOMEN LIMITED RIGHT UPPER QUADRANT

[Series 1: us abdomen limited · 0.12mm/px · 13 of 60 slices shown]
[im 1/60]
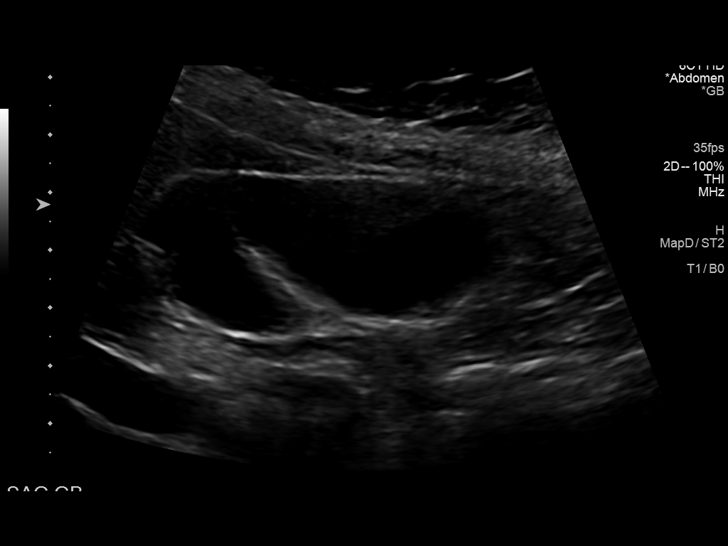
[im 5/60]
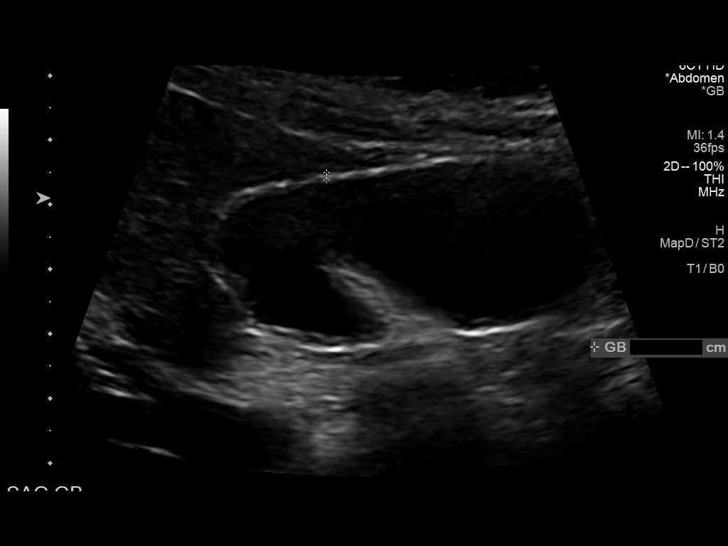
[im 10/60]
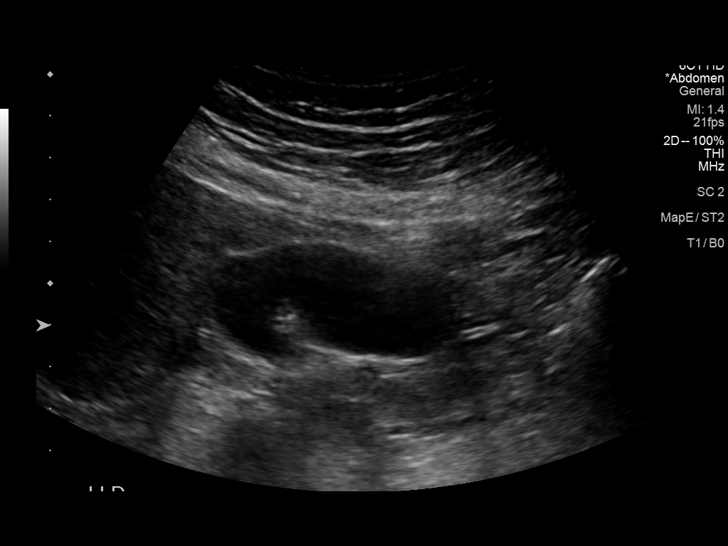
[im 15/60]
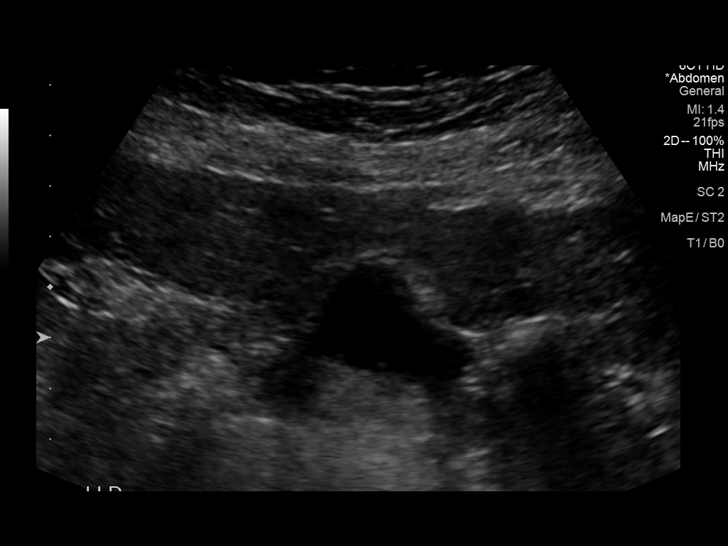
[im 20/60]
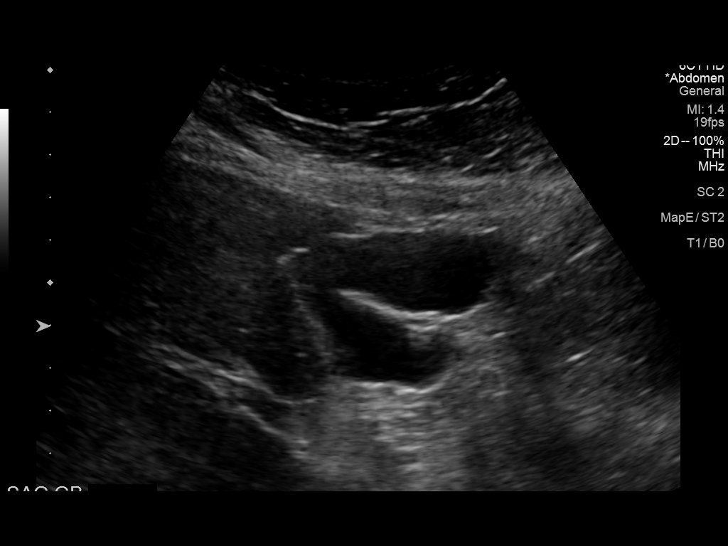
[im 25/60]
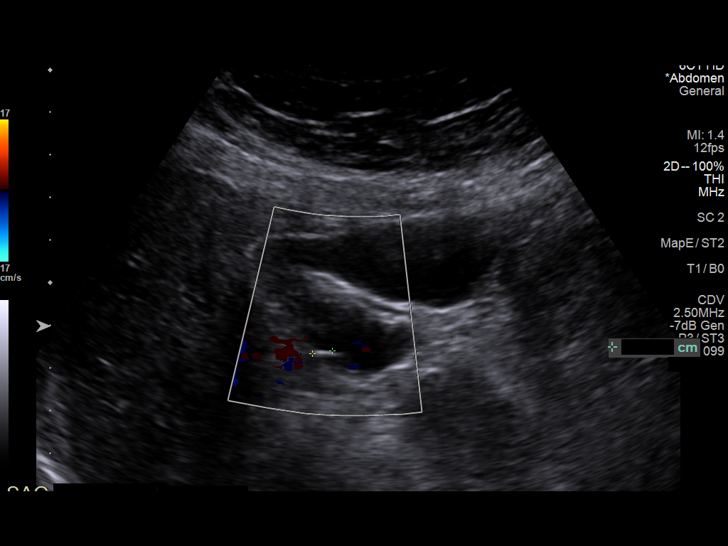
[im 30/60]
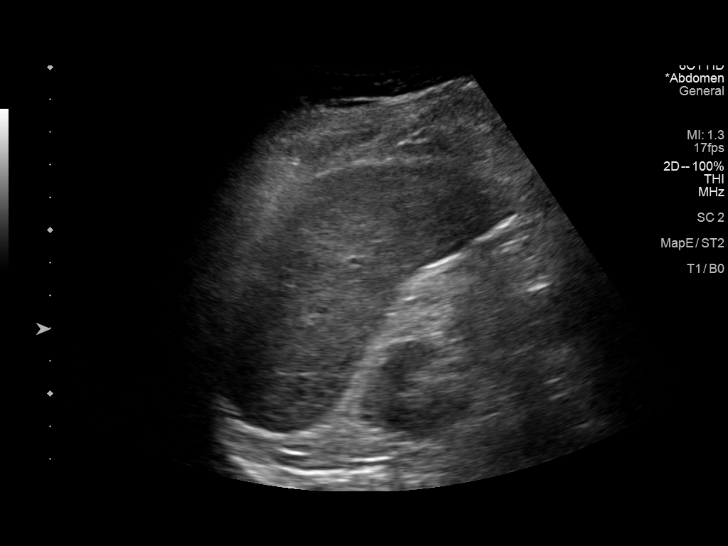
[im 35/60]
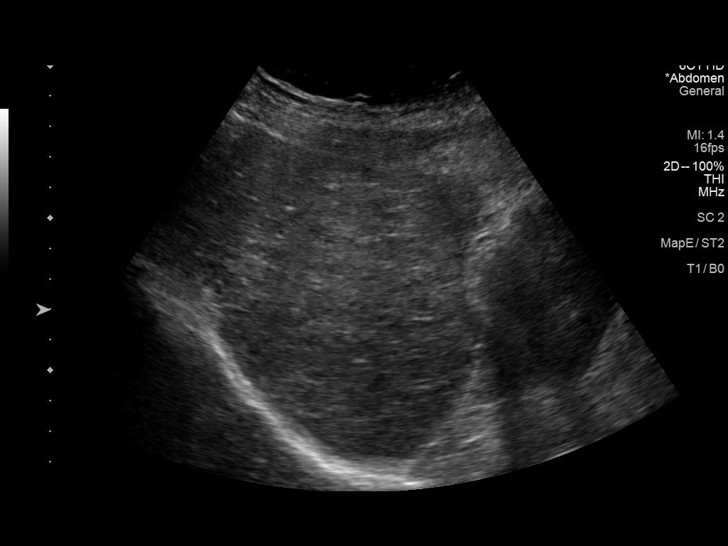
[im 40/60]
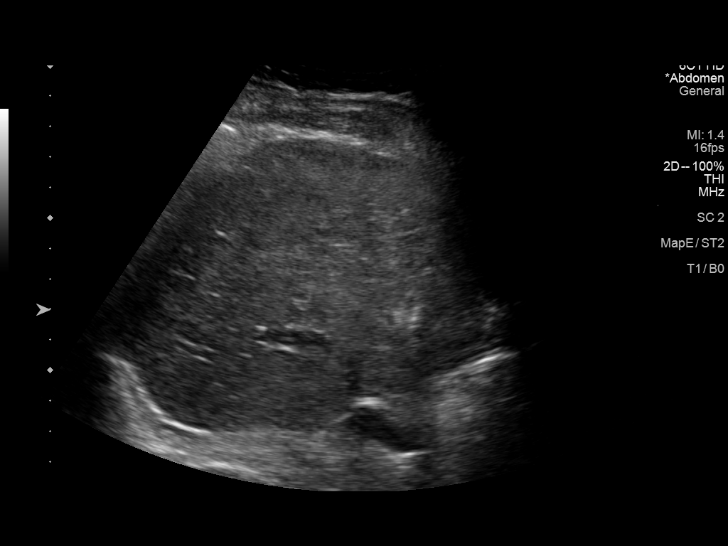
[im 45/60]
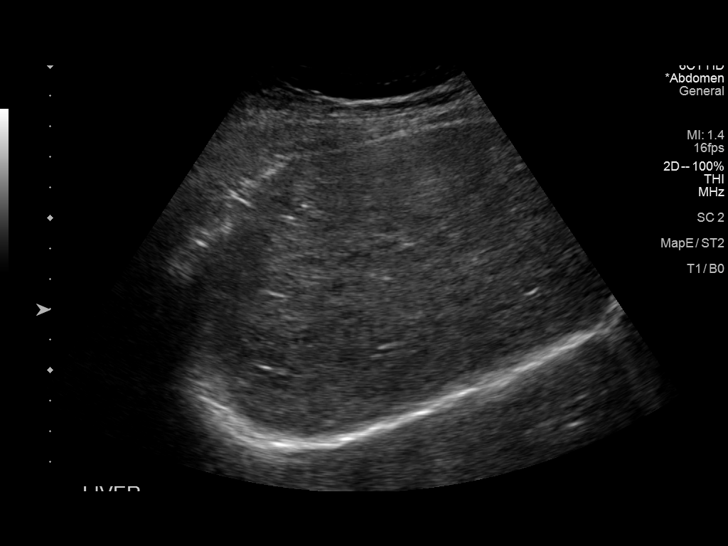
[im 50/60]
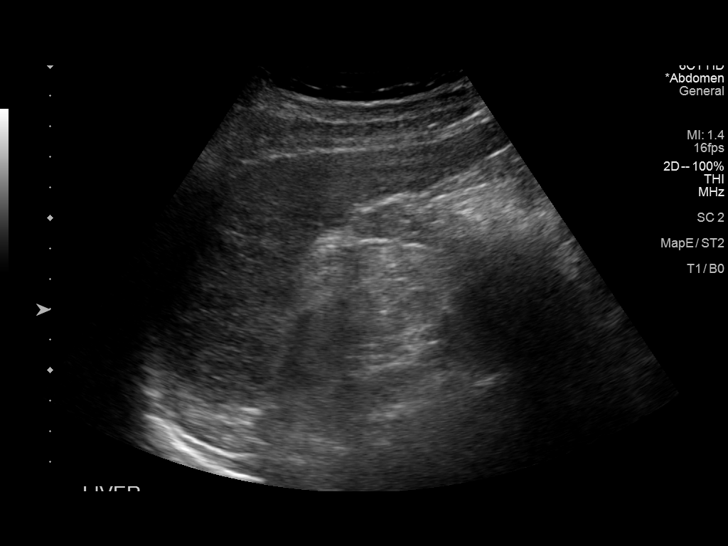
[im 55/60]
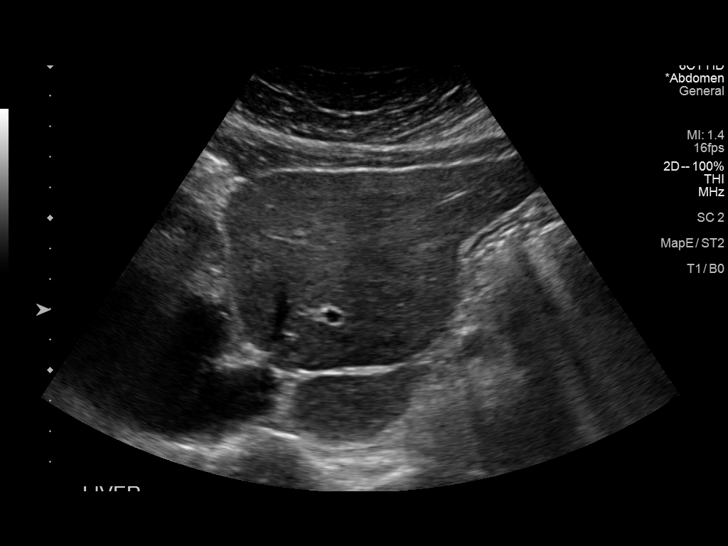
[im 60/60]
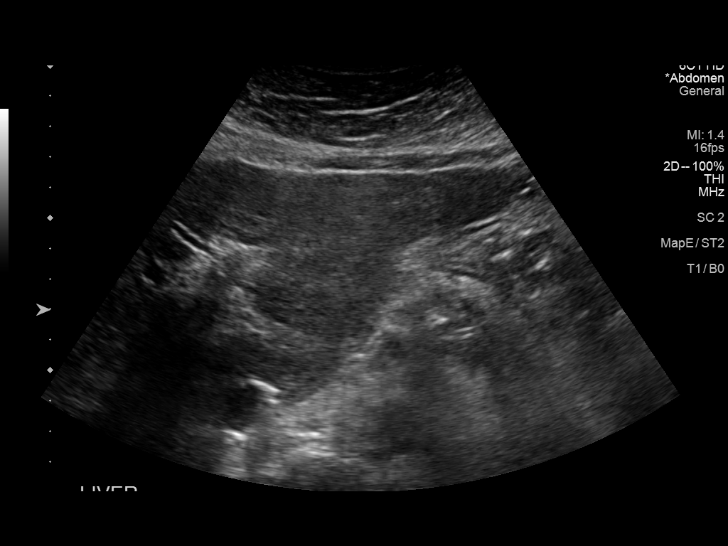

[13 of 25 positions shown; findings below may reference images not displayed]

FINDINGS: Gallbladder:

Within the gallbladder, there is a 5 mm echogenic focus which
neither moves nor shadows, a likely polyp. There are no echogenic
foci in the gallbladder which move and shadow as is expected with
cholelithiasis. No gallbladder wall thickening or pericholecystic
fluid. No sonographic Murphy sign noted by sonographer.

Common bile duct:

Diameter: 3 mm. No intrahepatic or extrahepatic biliary duct
dilatation.

Liver:

No focal lesion identified. Liver contour is somewhat nodular. The
echotexture of the liver is diffusely increased and coarsened. The
echotexture overall is somewhat inhomogeneous portal vein is patent
on color Doppler imaging with normal direction of blood flow towards
the liver.

Other: None.
IMPRESSION: 1. The appearance of the liver is indicative of hepatic cirrhosis.
The echotexture is somewhat coarsened as well as inhomogeneous but
overall increased. There is a somewhat nodular contour to the liver.
While no focal liver lesions are evident on this study, it must be
cautioned that the sensitivity of ultrasound for detection of focal
liver lesions is diminished in this circumstance.

2. 5 mm apparent polyp within the gallbladder. Per consensus
guidelines, a polyp of this size does not warrant additional imaging
surveillance. Gallbladder otherwise appears unremarkable.

## 2021-12-28 ENCOUNTER — Other Ambulatory Visit: Payer: Self-pay | Admitting: Internal Medicine

## 2021-12-28 DIAGNOSIS — K7460 Unspecified cirrhosis of liver: Secondary | ICD-10-CM | POA: Diagnosis not present

## 2021-12-28 DIAGNOSIS — Z86711 Personal history of pulmonary embolism: Secondary | ICD-10-CM | POA: Diagnosis not present

## 2021-12-28 DIAGNOSIS — I10 Essential (primary) hypertension: Secondary | ICD-10-CM | POA: Diagnosis not present

## 2021-12-28 DIAGNOSIS — I493 Ventricular premature depolarization: Secondary | ICD-10-CM | POA: Diagnosis not present

## 2021-12-28 DIAGNOSIS — I714 Abdominal aortic aneurysm, without rupture, unspecified: Secondary | ICD-10-CM | POA: Diagnosis not present

## 2021-12-28 DIAGNOSIS — R7303 Prediabetes: Secondary | ICD-10-CM | POA: Diagnosis not present

## 2021-12-28 DIAGNOSIS — I700 Atherosclerosis of aorta: Secondary | ICD-10-CM | POA: Diagnosis not present

## 2021-12-28 DIAGNOSIS — F17210 Nicotine dependence, cigarettes, uncomplicated: Secondary | ICD-10-CM | POA: Diagnosis not present

## 2022-01-28 ENCOUNTER — Ambulatory Visit
Admission: RE | Admit: 2022-01-28 | Discharge: 2022-01-28 | Disposition: A | Discharge status: Home / Self Care | Payer: Medicare Other | Source: Ambulatory Visit | Attending: Internal Medicine | Admitting: Internal Medicine

## 2022-01-28 ENCOUNTER — Other Ambulatory Visit: Payer: Medicare Other

## 2022-01-28 DIAGNOSIS — F17210 Nicotine dependence, cigarettes, uncomplicated: Secondary | ICD-10-CM

## 2022-02-23 ENCOUNTER — Encounter: Payer: Self-pay | Admitting: Pulmonary Disease

## 2022-02-23 ENCOUNTER — Ambulatory Visit (INDEPENDENT_AMBULATORY_CARE_PROVIDER_SITE_OTHER): Payer: Medicare Other | Admitting: Pulmonary Disease

## 2022-02-23 VITALS — BP 154/80 | HR 100 | Temp 98.6°F | Ht 73.0 in | Wt 235.2 lb

## 2022-02-23 DIAGNOSIS — J432 Centrilobular emphysema: Secondary | ICD-10-CM | POA: Diagnosis not present

## 2022-02-23 DIAGNOSIS — R918 Other nonspecific abnormal finding of lung field: Secondary | ICD-10-CM

## 2022-02-23 DIAGNOSIS — R911 Solitary pulmonary nodule: Secondary | ICD-10-CM

## 2022-02-23 HISTORY — DX: Solitary pulmonary nodule: R91.1

## 2022-02-23 NOTE — H&P (View-Only) (Signed)
$'@Patient'c$  ID: Wyatt Berry, male    DOB: Apr 01, 1951, 71 y.o.   MRN: [id number]  Chief Complaint  Patient presents with   Consult    Consult for lesion on left lung. Pt had CT scan done last month. Pt states no issues noted with his breathing. Pt states he has a seldom cough every now and then, but states nothing to serious. No inhalers noted from patient.     Referring provider: Wenda Low, MD  HPI:   71 y.o. man whom are seen in consultation for evaluation of pulmonary nodule discovered on lung cancer screening CT scan.  Note from referring provider reviewed.  Patient usual state of health.  After discussion with PCP underwent lung cancer screening.  This was performed 01/2022.  On my review interpretation there is a area of left upper lobe nodularity/scarring, left lower lobe greater than 1 cm rounded solid lesion and additional right middle lobe rounded solid lesion about 1 cm.  This prompted referral.  He gets a little short of breath exertion at times.  Nothing out of the ordinary.  Minimal cough.  Overall feels fine.  Has decreased his cigarette intake since this finding.  Down to 5 cigarettes a day from half a pack a day prior to the CT scan.  We discussed at length the CT findings and concern for possible malignancy.  The 2 distinct nodules to be a bit atypical but possible for synchronous primaries versus 2 distinct pathologies.  Discussed additional imaging via PET scan would be helpful in terms of deciding neck steps and he agrees to this.  PMH: Tobacco abuse, hypertension Surgical history:History reviewed. No pertinent surgical history. Family history: Mother with diabetes, CHF, brother with CAD Social history: Current smoker, down to a few cigarettes a day, historically half a pack per day, lives in [place] / Pulmonary Flowsheets:   ACT:      No data to display          MMRC:     No data to display          Epworth:      No data to  display          Tests:   FENO:  No results found for: "NITRICOXIDE"  PFT:     No data to display          WALK:      No data to display          Imaging: Personally reviewed and as per EMR discussion this note CT CHEST LUNG CA SCREEN LOW DOSE W/O CM  Result Date: 01/31/2022 CLINICAL DATA:  20 pack-year smoking history. Current smoker. Known hepatitis-C. EXAM: CT CHEST WITHOUT CONTRAST LOW-DOSE FOR LUNG CANCER SCREENING TECHNIQUE: Multidetector CT imaging of the chest was performed following the standard protocol without IV contrast. RADIATION DOSE REDUCTION: This exam was performed according to the departmental dose-optimization program which includes automated exposure control, adjustment of the mA and/or kV according to patient size and/or use of iterative reconstruction technique. COMPARISON:  05/02/2016 CTA chest FINDINGS: Cardiovascular: Aortic atherosclerosis. Normal heart size, without pericardial effusion. Left main and 3 vessel coronary artery calcification. Mediastinum/Nodes: No mediastinal or definite hilar adenopathy, given limitations of unenhanced CT. Lungs/Pleura: No pleural fluid. Moderate centrilobular emphysema. Mild bibasilar bronchiectasis is likely postinfectious or inflammatory. Posterior right middle lobe irregular somewhat amorphous soft tissue density lesion measures greater than volume derived equivalent diameter 14.3 mm on image 182/3. Contiguous or adjacent nodularity more  posteriorly measures volume derived equivalent diameter 9.4 on image 188. At the left apex, an area of scarring versus less likely neoplasm measures volume derived equivalent diameter 10.3 mm on image 43. A left lower lobe pleural-based pulmonary nodule measures volume derived equivalent diameter 12.4 mm on image 187. Upper Abdomen: Moderate cirrhosis, as evidenced by irregular hepatic capsule. 9 mm segment 4A hepatic cyst. Normal imaged portions of the spleen, stomach, pancreas, adrenal  glands. Perinephric interstitial thickening is symmetric and nonspecific. Musculoskeletal: No acute osseous abnormality. IMPRESSION: 1. Lung-RADS 4B, suspicious. Additional imaging evaluation or consultation with Pulmonology or Thoracic Surgery recommended. Bilateral pulmonary lesions, including a solid left lower lobe pulmonary nodule of volume derived equivalent diameter 12.4 mm and right middle lobe irregular soft tissue density lesion or lesions as detailed above. Consider further evaluation with PET to direct sampling. 2. No thoracic adenopathy 3. Aortic atherosclerosis (ICD10-I70.0), coronary artery atherosclerosis and emphysema (ICD10-J43.9). 4. Cirrhosis These results will be called to the ordering clinician or representative by the Radiologist Assistant, and communication documented in the PACS or [employer]. Electronically Signed   By: Abigail Miyamoto M.D.   On: 01/31/2022 09:00    Lab Results: Personally reviewed CBC    Component Value Date/Time   WBC 5.3 09/18/2020 1008   RBC 4.94 09/18/2020 1008   HGB 13.2 09/18/2020 1008   HCT 41.2 09/18/2020 1008   PLT 323 09/18/2020 1008   MCV 83.4 09/18/2020 1008   MCH 26.7 09/18/2020 1008   MCHC 32.0 09/18/2020 1008   RDW 15.9 (H) 09/18/2020 1008   LYMPHSABS 2.4 09/18/2020 1008   MONOABS 0.4 09/18/2020 1008   EOSABS 0.0 09/18/2020 1008   BASOSABS 0.0 09/18/2020 1008    BMET    Component Value Date/Time   NA 134 (L) 05/03/2016 0224   K 3.9 05/03/2016 0224   CL 102 05/03/2016 0224   CO2 21 (L) 05/03/2016 0224   GLUCOSE 119 (H) 05/03/2016 0224   BUN 8 05/03/2016 0224   CREATININE 0.97 05/03/2016 0224   CALCIUM 8.5 (L) 05/03/2016 0224   GFRNONAA >60 05/03/2016 0224   GFRAA >60 05/03/2016 0224    BNP    Component Value Date/Time   BNP 74.6 05/01/2016 2304    ProBNP No results found for: "PROBNP"  Specialty Problems       Pulmonary Problems   Acute respiratory failure with hypoxia (HCC)    No Known  Allergies  Immunization History  Administered Date(s) Administered   Fluad Quad(high Dose 70+) 02/24/2019   Influenza, High Dose Seasonal PF 04/05/2018   PFIZER(Purple Top)SARS-COV-2 Vaccination 06/29/2019, 07/20/2019   Pneumococcal Conjugate-13 06/08/2017   Pneumococcal Polysaccharide-23 02/21/2013   Tdap 02/21/2013   Zoster Recombinat (Shingrix) 06/16/2018, 03/11/2019   Zoster, Live 10/28/2014, 06/16/2018, 03/11/2019    Past Medical History:  Diagnosis Date   Hepatitis    c   Hypertension     Tobacco History: Social History   Tobacco Use  Smoking Status Every Day   Packs/day: 0.50   Types: Cigarettes   Start date: 1973  Smokeless Tobacco Never  Tobacco Comments   Pt states that he smokes about 5 cigarettes a day. 02/23/22 ALS    Ready to quit: Not Answered Counseling given: Not Answered Tobacco comments: Pt states that he smokes about 5 cigarettes a day. 02/23/22 ALS      Outpatient Encounter Medications as of 02/23/2022  Medication Sig   amLODipine (NORVASC) 2.5 MG tablet Take 1 tablet (2.5 mg total) by mouth daily.  aspirin EC 81 MG tablet Take by mouth.   atorvastatin (LIPITOR) 10 MG tablet Take 10 mg by mouth daily.   losartan-hydrochlorothiazide (HYZAAR) 100-12.5 MG per tablet Take 1 tablet by mouth daily.   metoprolol succinate (TOPROL-XL) 25 MG 24 hr tablet Take 25 mg by mouth at bedtime.   Multiple Vitamin (MULTIVITAMIN) tablet Take 1 tablet by mouth daily.   tamsulosin (FLOMAX) 0.4 MG CAPS capsule Take 0.4 mg by mouth daily after supper.   No facility-administered encounter medications on file as of 02/23/2022.     Review of Systems  Review of Systems  No chest pain with exertion.  No orthopnea or PND.  Comprehensive review of systems otherwise negative. Physical Exam  BP (!) 154/80 (BP Location: Left Arm, Patient Position: Sitting, Cuff Size: Normal) Comment: pt states he is nervous.  Pulse 100   Temp 98.6 F (37 C) (Oral)   Ht '6\' 1"'$  (1.854 m)    Wt 235 lb 3.2 oz (106.7 kg)   SpO2 95%   BMI 31.03 kg/m   Wt Readings from Last 5 Encounters:  02/23/22 235 lb 3.2 oz (106.7 kg)  08/20/20 248 lb 14.4 oz (112.9 kg)  07/29/20 248 lb 9.6 oz (112.8 kg)  05/05/16 240 lb 8 oz (109.1 kg)  01/23/13 235 lb (106.6 kg)    BMI Readings from Last 5 Encounters:  02/23/22 31.03 kg/m  08/20/20 31.96 kg/m  07/29/20 31.92 kg/m  05/05/16 30.88 kg/m  01/23/13 30.17 kg/m     Physical Exam General: Sitting in chair, no acute distress Eyes: EOMI, no icterus Neck: Supple, no JVP Pulmonary: Clear, normal work of breathing Cardiovascular: Warm, no edema Abdomen: Nondistended, sounds present MSK: No synovitis, no joint effusion Neuro: Normal gait, no weakness Psych: Normal mood, full affect   Assessment & Plan:   Lung nodules: Rounded left lower lobe, right lower lobe as well as left upper lobe fibrotic appearing lesion.  High suspicion for lung cancer, given the appearance high suspicion for the lower lobe nodules and synchronous primaries.  Discussed this could be PET negative but still malignant or could be benign nodules.  PET scan for further evaluation.  Discussed likely need for bronchoscopic biopsy.  PFTs ordered in anticipation of possible surgical intervention in the future.  Tobacco abuse: Smoking assessment and cessation counseling Patient currently smoking: I have advised the patient to quit/stop smoking as soon as possible due to high risk for multiple medical problems.  It will also be very difficult for [place] to manage patient's  respiratory symptoms and status if we continue to expose her lungs to a known irritant.  We do not advise e-cigarettes as a form of stopping smoking. Patient is  willing to quit smoking. I have advised the patient that we can assist and have options of nicotine replacement therapy, provided smoking cessation education today, provided smoking cessation counseling, and provided cessation resources.  Stressed  that he would need to stop smoking prior to surgical intervention if needed for possible lung cancer.  Follow-up next office visit office visit for assessment of smoking cessation.  I spent 5 minutes in tobacco/smoking cessation counseling.   Return in about 4 weeks (around 03/23/2022).   Lanier Clam, MD 02/23/2022

## 2022-02-23 NOTE — Patient Instructions (Signed)
Nice to meet you  To get further information about the nodules in your lungs I have ordered a PET scan.  This is a scan in conjunction with the CT scan with sugar water through an IV that will give [place] more information about what is going on with the lung nodules.  This will help [place] decide, if needed, the best location for a biopsy in the future.  In case surgery is needed in the future, I have ordered pulmonary function test.  These take 45 minutes to an hour.  It helps [place] know if surgery is needed that it would be safe to do.  It will also help [place] know more information about how well your lungs are functioning in case we need to treat shortness of breath in the future.  Based on the results of the scan, we may need to do a biopsy.  I will help arrange this soon as possible if it is needed.  Return to clinic in 4 weeks or sooner if needed with Dr. Silas Flood

## 2022-02-23 NOTE — Progress Notes (Signed)
$'@Patient'F$  ID: Wyatt Berry, male    DOB: 1950-08-27, 71 y.o.   MRN: [id number]  Chief Complaint  Patient presents with   Consult    Consult for lesion on left lung. Pt had CT scan done last month. Pt states no issues noted with his breathing. Pt states he has a seldom cough every now and then, but states nothing to serious. No inhalers noted from patient.     Referring provider: Wenda Low, MD  HPI:   71 y.o. man whom are seen in consultation for evaluation of pulmonary nodule discovered on lung cancer screening CT scan.  Note from referring provider reviewed.  Patient usual state of health.  After discussion with PCP underwent lung cancer screening.  This was performed 01/2022.  On my review interpretation there is a area of left upper lobe nodularity/scarring, left lower lobe greater than 1 cm rounded solid lesion and additional right middle lobe rounded solid lesion about 1 cm.  This prompted referral.  He gets a little short of breath exertion at times.  Nothing out of the ordinary.  Minimal cough.  Overall feels fine.  Has decreased his cigarette intake since this finding.  Down to 5 cigarettes a day from half a pack a day prior to the CT scan.  We discussed at length the CT findings and concern for possible malignancy.  The 2 distinct nodules to be a bit atypical but possible for synchronous primaries versus 2 distinct pathologies.  Discussed additional imaging via PET scan would be helpful in terms of deciding neck steps and he agrees to this.  PMH: Tobacco abuse, hypertension Surgical history:History reviewed. No pertinent surgical history. Family history: Mother with diabetes, CHF, brother with CAD Social history: Current smoker, down to a few cigarettes a day, historically half a pack per day, lives in [place] / Pulmonary Flowsheets:   ACT:      No data to display          MMRC:     No data to display          Epworth:      No data to  display          Tests:   FENO:  No results found for: "NITRICOXIDE"  PFT:     No data to display          WALK:      No data to display          Imaging: Personally reviewed and as per EMR discussion this note CT CHEST LUNG CA SCREEN LOW DOSE W/O CM  Result Date: 01/31/2022 CLINICAL DATA:  20 pack-year smoking history. Current smoker. Known hepatitis-C. EXAM: CT CHEST WITHOUT CONTRAST LOW-DOSE FOR LUNG CANCER SCREENING TECHNIQUE: Multidetector CT imaging of the chest was performed following the standard protocol without IV contrast. RADIATION DOSE REDUCTION: This exam was performed according to the departmental dose-optimization program which includes automated exposure control, adjustment of the mA and/or kV according to patient size and/or use of iterative reconstruction technique. COMPARISON:  05/02/2016 CTA chest FINDINGS: Cardiovascular: Aortic atherosclerosis. Normal heart size, without pericardial effusion. Left main and 3 vessel coronary artery calcification. Mediastinum/Nodes: No mediastinal or definite hilar adenopathy, given limitations of unenhanced CT. Lungs/Pleura: No pleural fluid. Moderate centrilobular emphysema. Mild bibasilar bronchiectasis is likely postinfectious or inflammatory. Posterior right middle lobe irregular somewhat amorphous soft tissue density lesion measures greater than volume derived equivalent diameter 14.3 mm on image 182/3. Contiguous or adjacent nodularity more  posteriorly measures volume derived equivalent diameter 9.4 on image 188. At the left apex, an area of scarring versus less likely neoplasm measures volume derived equivalent diameter 10.3 mm on image 43. A left lower lobe pleural-based pulmonary nodule measures volume derived equivalent diameter 12.4 mm on image 187. Upper Abdomen: Moderate cirrhosis, as evidenced by irregular hepatic capsule. 9 mm segment 4A hepatic cyst. Normal imaged portions of the spleen, stomach, pancreas, adrenal  glands. Perinephric interstitial thickening is symmetric and nonspecific. Musculoskeletal: No acute osseous abnormality. IMPRESSION: 1. Lung-RADS 4B, suspicious. Additional imaging evaluation or consultation with Pulmonology or Thoracic Surgery recommended. Bilateral pulmonary lesions, including a solid left lower lobe pulmonary nodule of volume derived equivalent diameter 12.4 mm and right middle lobe irregular soft tissue density lesion or lesions as detailed above. Consider further evaluation with PET to direct sampling. 2. No thoracic adenopathy 3. Aortic atherosclerosis (ICD10-I70.0), coronary artery atherosclerosis and emphysema (ICD10-J43.9). 4. Cirrhosis These results will be called to the ordering clinician or representative by the Radiologist Assistant, and communication documented in the PACS or [employer]. Electronically Signed   By: Abigail Miyamoto M.D.   On: 01/31/2022 09:00    Lab Results: Personally reviewed CBC    Component Value Date/Time   WBC 5.3 09/18/2020 1008   RBC 4.94 09/18/2020 1008   HGB 13.2 09/18/2020 1008   HCT 41.2 09/18/2020 1008   PLT 323 09/18/2020 1008   MCV 83.4 09/18/2020 1008   MCH 26.7 09/18/2020 1008   MCHC 32.0 09/18/2020 1008   RDW 15.9 (H) 09/18/2020 1008   LYMPHSABS 2.4 09/18/2020 1008   MONOABS 0.4 09/18/2020 1008   EOSABS 0.0 09/18/2020 1008   BASOSABS 0.0 09/18/2020 1008    BMET    Component Value Date/Time   NA 134 (L) 05/03/2016 0224   K 3.9 05/03/2016 0224   CL 102 05/03/2016 0224   CO2 21 (L) 05/03/2016 0224   GLUCOSE 119 (H) 05/03/2016 0224   BUN 8 05/03/2016 0224   CREATININE 0.97 05/03/2016 0224   CALCIUM 8.5 (L) 05/03/2016 0224   GFRNONAA >60 05/03/2016 0224   GFRAA >60 05/03/2016 0224    BNP    Component Value Date/Time   BNP 74.6 05/01/2016 2304    ProBNP No results found for: "PROBNP"  Specialty Problems       Pulmonary Problems   Acute respiratory failure with hypoxia (HCC)    No Known  Allergies  Immunization History  Administered Date(s) Administered   Fluad Quad(high Dose 65+) 02/24/2019   Influenza, High Dose Seasonal PF 04/05/2018   PFIZER(Purple Top)SARS-COV-2 Vaccination 06/29/2019, 07/20/2019   Pneumococcal Conjugate-13 06/08/2017   Pneumococcal Polysaccharide-23 02/21/2013   Tdap 02/21/2013   Zoster Recombinat (Shingrix) 06/16/2018, 03/11/2019   Zoster, Live 10/28/2014, 06/16/2018, 03/11/2019    Past Medical History:  Diagnosis Date   Hepatitis    c   Hypertension     Tobacco History: Social History   Tobacco Use  Smoking Status Every Day   Packs/day: 0.50   Types: Cigarettes   Start date: 1973  Smokeless Tobacco Never  Tobacco Comments   Pt states that he smokes about 5 cigarettes a day. 02/23/22 ALS    Ready to quit: Not Answered Counseling given: Not Answered Tobacco comments: Pt states that he smokes about 5 cigarettes a day. 02/23/22 ALS      Outpatient Encounter Medications as of 02/23/2022  Medication Sig   amLODipine (NORVASC) 2.5 MG tablet Take 1 tablet (2.5 mg total) by mouth daily.  aspirin EC 81 MG tablet Take by mouth.   atorvastatin (LIPITOR) 10 MG tablet Take 10 mg by mouth daily.   losartan-hydrochlorothiazide (HYZAAR) 100-12.5 MG per tablet Take 1 tablet by mouth daily.   metoprolol succinate (TOPROL-XL) 25 MG 24 hr tablet Take 25 mg by mouth at bedtime.   Multiple Vitamin (MULTIVITAMIN) tablet Take 1 tablet by mouth daily.   tamsulosin (FLOMAX) 0.4 MG CAPS capsule Take 0.4 mg by mouth daily after supper.   No facility-administered encounter medications on file as of 02/23/2022.     Review of Systems  Review of Systems  No chest pain with exertion.  No orthopnea or PND.  Comprehensive review of systems otherwise negative. Physical Exam  BP (!) 154/80 (BP Location: Left Arm, Patient Position: Sitting, Cuff Size: Normal) Comment: pt states he is nervous.  Pulse 100   Temp 98.6 F (37 C) (Oral)   Ht '6\' 1"'$  (1.854 m)    Wt 235 lb 3.2 oz (106.7 kg)   SpO2 95%   BMI 31.03 kg/m   Wt Readings from Last 5 Encounters:  02/23/22 235 lb 3.2 oz (106.7 kg)  08/20/20 248 lb 14.4 oz (112.9 kg)  07/29/20 248 lb 9.6 oz (112.8 kg)  05/05/16 240 lb 8 oz (109.1 kg)  01/23/13 235 lb (106.6 kg)    BMI Readings from Last 5 Encounters:  02/23/22 31.03 kg/m  08/20/20 31.96 kg/m  07/29/20 31.92 kg/m  05/05/16 30.88 kg/m  01/23/13 30.17 kg/m     Physical Exam General: Sitting in chair, no acute distress Eyes: EOMI, no icterus Neck: Supple, no JVP Pulmonary: Clear, normal work of breathing Cardiovascular: Warm, no edema Abdomen: Nondistended, sounds present MSK: No synovitis, no joint effusion Neuro: Normal gait, no weakness Psych: Normal mood, full affect   Assessment & Plan:   Lung nodules: Rounded left lower lobe, right lower lobe as well as left upper lobe fibrotic appearing lesion.  High suspicion for lung cancer, given the appearance high suspicion for the lower lobe nodules and synchronous primaries.  Discussed this could be PET negative but still malignant or could be benign nodules.  PET scan for further evaluation.  Discussed likely need for bronchoscopic biopsy.  PFTs ordered in anticipation of possible surgical intervention in the future.  Tobacco abuse: Smoking assessment and cessation counseling Patient currently smoking: I have advised the patient to quit/stop smoking as soon as possible due to high risk for multiple medical problems.  It will also be very difficult for [place] to manage patient's  respiratory symptoms and status if we continue to expose her lungs to a known irritant.  We do not advise e-cigarettes as a form of stopping smoking. Patient is  willing to quit smoking. I have advised the patient that we can assist and have options of nicotine replacement therapy, provided smoking cessation education today, provided smoking cessation counseling, and provided cessation resources.  Stressed  that he would need to stop smoking prior to surgical intervention if needed for possible lung cancer.  Follow-up next office visit office visit for assessment of smoking cessation.  I spent 5 minutes in tobacco/smoking cessation counseling.   Return in about 4 weeks (around 03/23/2022).   Lanier Clam, MD 02/23/2022

## 2022-02-24 ENCOUNTER — Telehealth: Payer: Self-pay | Admitting: Pulmonary Disease

## 2022-02-24 DIAGNOSIS — R911 Solitary pulmonary nodule: Secondary | ICD-10-CM | POA: Insufficient documentation

## 2022-03-09 ENCOUNTER — Ambulatory Visit (HOSPITAL_COMMUNITY)
Admission: RE | Admit: 2022-03-09 | Discharge: 2022-03-09 | Disposition: A | Discharge status: Home / Self Care | Payer: Medicare Other | Source: Ambulatory Visit | Attending: Pulmonary Disease | Admitting: Pulmonary Disease

## 2022-03-09 DIAGNOSIS — R918 Other nonspecific abnormal finding of lung field: Secondary | ICD-10-CM | POA: Insufficient documentation

## 2022-03-09 LAB — GLUCOSE, CAPILLARY: Glucose-Capillary: 109 mg/dL — ABNORMAL HIGH (ref 70–99)

## 2022-03-09 MED ORDER — FLUDEOXYGLUCOSE F - 18 (FDG) INJECTION
11.7300 | Freq: Once | INTRAVENOUS | Status: AC | PRN
  Administered 2022-03-09: 11.73 via INTRAVENOUS

## 2022-03-10 NOTE — Progress Notes (Signed)
RML PET avid nodule, no LAD or distant mets on PET. Could you nav it?

## 2022-03-10 NOTE — Progress Notes (Signed)
Called patient to discuss results of PET scan.  Left lower lobe nodule without PET avidity.  Right middle lobe nodule does show PET avidity.  This is concerning for lung cancer.  This was shared with him.  Fortunately, no sign of lymphadenopathy or distant spread.  Discussed my recommendation for navigational bronchoscopy for biopsy.  He expressed understanding.  We will coordinate with colleagues to schedule procedure in the coming weeks.  Discussed his need for likely super D CT scan, pretest COVID testing, as well as a brief description of the procedure and the need for someone to drive him to and from procedure given will be done under anesthesia.

## 2022-03-13 NOTE — Telephone Encounter (Signed)
-----   Message from Lanier Clam, MD sent at 03/10/2022  5:05 PM EDT ----- RML PET avid nodule, no LAD or distant mets on PET. Could you nav it?

## 2022-03-13 NOTE — Telephone Encounter (Signed)
PCCM:  I spoke with Dr. Silas Flood. Patient is agreeable to proceed with bx per [place]  Orders placed for bronchoscopy   Next available date is 03/22/2022  Garner Nash, DO [facility] 03/13/2022 5:16 PM

## 2022-03-14 ENCOUNTER — Encounter: Payer: Self-pay | Admitting: Pulmonary Disease

## 2022-03-14 ENCOUNTER — Telehealth: Payer: Self-pay | Admitting: Pulmonary Disease

## 2022-03-14 NOTE — Telephone Encounter (Signed)
Bronch scheduled  Case ID: [id number]  10/31 at 1pm, arrive at 10:30 AM  At [facility]   CT Chest Super D 10/27 at 10:30 AM At [facility], entrance C   Left patient a vm to return my call. Set a reminder to call pt again if no response before the end of today. Thanks

## 2022-03-15 ENCOUNTER — Other Ambulatory Visit: Payer: Self-pay

## 2022-03-15 ENCOUNTER — Ambulatory Visit (HOSPITAL_COMMUNITY)
Admission: RE | Admit: 2022-03-15 | Discharge: 2022-03-15 | Disposition: A | Discharge status: Home / Self Care | Payer: Medicare Other | Source: Ambulatory Visit | Attending: Pulmonary Disease | Admitting: Pulmonary Disease

## 2022-03-15 ENCOUNTER — Telehealth: Payer: Self-pay | Admitting: Pulmonary Disease

## 2022-03-15 DIAGNOSIS — R911 Solitary pulmonary nodule: Secondary | ICD-10-CM | POA: Diagnosis not present

## 2022-03-15 DIAGNOSIS — R918 Other nonspecific abnormal finding of lung field: Secondary | ICD-10-CM | POA: Diagnosis not present

## 2022-03-15 DIAGNOSIS — J9811 Atelectasis: Secondary | ICD-10-CM | POA: Diagnosis not present

## 2022-03-15 DIAGNOSIS — Z01818 Encounter for other preprocedural examination: Secondary | ICD-10-CM

## 2022-03-15 NOTE — Telephone Encounter (Signed)
Patient requested if Icard or a nurse can call him and further explain the procedure on 10/31  He is confused about bronch.   Thanks

## 2022-03-15 NOTE — Telephone Encounter (Signed)
Patient is already scheduled for a PFT on 11/14 at 12pm and OV with Dr. Silas Flood at 1pm. Would it be ok for him to keep these appts?

## 2022-03-18 ENCOUNTER — Other Ambulatory Visit (HOSPITAL_COMMUNITY): Payer: Medicare Other

## 2022-03-18 ENCOUNTER — Other Ambulatory Visit: Payer: Medicare Other

## 2022-03-18 DIAGNOSIS — Z01818 Encounter for other preprocedural examination: Secondary | ICD-10-CM

## 2022-03-18 DIAGNOSIS — R3121 Asymptomatic microscopic hematuria: Secondary | ICD-10-CM | POA: Diagnosis not present

## 2022-03-21 ENCOUNTER — Encounter (HOSPITAL_COMMUNITY): Payer: Self-pay | Admitting: Pulmonary Disease

## 2022-03-21 LAB — SPECIMEN STATUS REPORT

## 2022-03-21 LAB — NOVEL CORONAVIRUS, NAA: SARS-CoV-2, NAA: NOT DETECTED

## 2022-03-21 NOTE — Progress Notes (Signed)
Anesthesia Chart Review: SAME DAY WORK-UP  Case: [id number] Date/Time: 03/22/22 1330   Procedures:      ROBOTIC ASSISTED NAVIGATIONAL BRONCHOSCOPY (Right) - ION w/ CIOS     VIDEO BRONCHOSCOPY WITH ENDOBRONCHIAL ULTRASOUND (Bilateral)   Anesthesia type: General   Diagnosis: Lung nodule [R91.1]   Pre-op diagnosis: lung nodule   Location: [facility] ENDO [facility]ARD[facility]OLOG[facility] ROOM 3 / [facility]   Surgeons: Garner Nash, DO       DISCUSSION: Patient is a 71 year old male scheduled for the above procedure. He is a smoker who was found to have a LLL and RML nodules on recent lung cancer screening chest CT. He was referred to Dr. Silas Flood by his PCP. PET scan showed RML hypermetabolic activity, but not in the LLL. Above procedure recommended.  Other history includes smoking, HTN, hepatitis C (with cirrhosis; HCV s/p Harvoni treatment ~ 2017), PE (large at least submassive bilateral PE associated with LLL pulmonary infarct & right heart strain, s/p Heparin and did not recent thrombolysis, transitioned to Xarelto, discontinued in 2022 after "unremarkable" hypercoagulable work-up), DVT (subacute LLE DVT 05/02/16). 3.7 AAA 03/10/22 PET Scan.   Per 10/21/21 hepatology follow-up, he has "well compensated cirrhosis based on F3-F4 fibrosis by elastography and a nodular contour to the liver." No thrombocytopenia or splenomegaly by 2019 [place]. Consider FibroScan in the future to assess liver stiffness and determine need for variceal screening. Six month follow-up planned.   03/18/22 COVID-19 test negative. He is a same day work-up, so labs and EKG on arrival as indicated. Anesthesia team to evaluate on the day of surgery.    VS:  BP Readings from Last 3 Encounters:  02/23/22 (!) 154/80  08/20/20 (!) 155/90  07/29/20 (!) 181/82   Pulse Readings from Last 3 Encounters:  02/23/22 100  08/20/20 (!) 105  07/29/20 (!) 105     PROVIDERS: Wenda Low, MD is PCP  - Roosevelt Locks, NP is hepatology provider ([place]). Last visit 10/21/21.  Larey Days, MD is pulmonologist - Benay Pike, MD is HEM-ONC. Had unprovoked LLE DVT/PE in 2017 and had been on Xarelto. Last visit 09/24/20 to review hypercoagulable work-up results which were "unremarkable". Lupus anticoagulant was elevated, but normal once repeated off Xarelto. He wrote, "I do not see a reason for indefinite anticoagulation in this patient. ... We have discussed risk factors and have encouraged him to stay active, hydrated, avoid any testosterone supplementation and discussed with his surgeon if he is in need of any major surgery about his history of PE.  He understands there is a risk of recurrent DVT/PE which is about 5% a year." As needed follow-up recommended.    LABS: For day of surgery as indicated. No recent comparison labs. Per hepatology notes, labs "stable" in 04/2021. Reportedly not significant thrombocytopenia previously.    IMAGES: CT Super D Chest 03/15/22: IMPRESSION: 1. Amorphous posterior right middle lobe lesion measures approximately 3.1 x 2.7 cm today, stable in the interval. This represents the lesion seen to be hypermetabolic on previous PET-CT. 2. 13 mm left lower lobe nodule is similar to prior. No hypermetabolism noted in this nodule on prior PET-CT. 3. 12 mm short axis left subpectoral lymph node has increased in size from 6 mm previously. This may be reactive and did not show hypermetabolism on the recent PET-CT. Repeat CT chest in 3 months could be used to ensure stability. 4. Nodular hepatic contour suggests cirrhosis. 5.  Aortic Atherosclerosis (  ICD10-I70.0).  PET Scan 03/09/22: IMPRESSION: - Sub-solid pulmonary nodule in posterior right middle lobe is hypermetabolic, suspicious for bronchogenic carcinoma. - 11 mm solid nodule in superior left lower lobe shows no hypermetabolic activity, suggesting a benign granuloma. Recommend continued attention on follow-up  imaging. - No evidence of metastatic disease. - 3.7 cm infrarenal abdominal aortic aneurysm. Recommend follow-up ultrasound every 2 years. This recommendation follows ACR consensus guidelines: White Paper of the ACR Incidental Findings Committee II on Vascular Findings. J Am Coll Radiol 2013; 10:789-794. - Aortic Atherosclerosis (ICD10-I70.0) and Emphysema (ICD10-J43.9).    EKG: Last EKG seen is from 04/2016.    CV: Echo 05/02/16 (in setting of at least submassive bilateral PE with right heart strain): Study Conclusions  - Procedure narrative: Transthoracic echocardiography. Image    quality was suboptimal. The study was technically difficult, as a    result of restricted patient mobility.  - Left ventricle: The cavity size was normal. Wall thickness was    increased in a pattern of moderate LVH. Systolic function was    vigorous. The estimated ejection fraction was in the range of 65%    to 70%. Incoordinate septal motion. The study is not technically    sufficient to allow evaluation of LV diastolic function.  - Mitral valve: Mildly thickened leaflets . There was trivial    regurgitation.  - Left atrium: The atrium was mildly dilated.  - Right ventricle: Appears dilated and hypokinetic, with a    hyperdynamic apex (McConnell&'s sign), suggestive of RV strain.  - Right atrium: The atrium was mildly dilated.  - Tricuspid valve: There was moderate regurgitation.  - Pulmonary arteries: PA peak pressure: 57 mm Hg (S).  - Inferior vena cava: The vessel was normal in size. The    respirophasic diameter changes were in the normal range (>= 50%),    consistent with normal central venous pressure.  - Pericardium, extracardiac: There was no pericardial effusion.   Impressions:  - Technically difficult study, LVEF 65-70%, moderate LVH,    incoordinate septal motion, mild biatrial enlargement, the RV is    not well-visualized, however, it appears hypokinetic and dilated    in the  subcostal images with sparing of the apex suggestive of    right heart strain, there is moderate TR, RVSP 57 mmHg, normal    IVC.    Past Medical History:  Diagnosis Date   Cirrhosis of liver ([place])    DVT (deep venous thrombosis) ([place]) 05/02/2016   subacute LLE DVT 05/02/16   Hepatitis    HCV, s/p treatment   Hypertension    PE (pulmonary thromboembolism) ([place]) 05/02/2016   bilateral at least submassive PE with right heart strain, s/p Xarelto x 4 years    No past surgical history on file.  MEDICATIONS: No current facility-administered medications for this encounter.    amLODipine (NORVASC) 2.5 MG tablet   aspirin EC 81 MG tablet   atorvastatin (LIPITOR) 10 MG tablet   losartan-hydrochlorothiazide (HYZAAR) 100-12.5 MG per tablet   metoprolol succinate (TOPROL-XL) 25 MG 24 hr tablet   Multiple Vitamin (MULTIVITAMIN) tablet   tamsulosin (FLOMAX) 0.4 MG CAPS capsule    Myra Gianotti, PA-C Surgical Short Stay/Anesthesiology [facility] Phone [phone] [facility] Phone [phone] 03/21/2022 10:14 AM

## 2022-03-21 NOTE — Anesthesia Preprocedure Evaluation (Signed)
Anesthesia Evaluation  Patient identified by MRN, date of birth, ID band Patient awake    Reviewed: Allergy & Precautions, NPO status , Patient's Chart, lab work & pertinent test results  Airway Mallampati: II  TM Distance: >3 FB     Dental  (+) Dental Advisory Given   Pulmonary Current Smoker and Patient abstained from smoking.,    breath sounds clear to auscultation       Cardiovascular hypertension, Pt. on medications  Rhythm:Regular Rate:Normal     Neuro/Psych negative neurological ROS     GI/Hepatic negative GI ROS, (+) Hepatitis -, C  Endo/Other  negative endocrine ROS  Renal/GU negative Renal ROS     Musculoskeletal   Abdominal   Peds  Hematology negative hematology ROS (+)   Anesthesia Other Findings   Reproductive/Obstetrics                            Anesthesia Physical Anesthesia Plan  ASA: 3  Anesthesia Plan: General   Post-op Pain Management: Tylenol PO (pre-op)* and Minimal or no pain anticipated   Induction: Intravenous  PONV Risk Score and Plan: 1 and Dexamethasone, Ondansetron and Treatment may vary due to age or medical condition  Airway Management Planned: Oral ETT  Additional Equipment:   Intra-op Plan:   Post-operative Plan: Extubation in OR  Informed Consent: I have reviewed the patients History and Physical, chart, labs and discussed the procedure including the risks, benefits and alternatives for the proposed anesthesia with the patient or authorized representative who has indicated his/her understanding and acceptance.     Dental advisory given  Plan Discussed with: CRNA  Anesthesia Plan Comments: ( )       Anesthesia Quick Evaluation

## 2022-03-21 NOTE — Progress Notes (Signed)
PCP - Dr Wenda Low Cardiologist - n/a  CT Chest x-ray - 03/15/22 EKG - DOS Stress Test - n/a ECHO - 05/02/16 Cardiac Cath - n/a  ICD Pacemaker/Loop - n/a  Sleep Study -  n/a CPAP - none  Aspirin Instructions: Follow your surgeon's instructions on when to stop aspirin prior to surgery,  If no instructions were given by your surgeon then you will need to call the office for those instructions.  Anesthesia review: Yes  STOP now taking any Aspirin (unless otherwise instructed by your surgeon), Aleve, Naproxen, Ibuprofen, Motrin, Advil, Goody's, BC's, all herbal medications, fish oil, and all vitamins.   Coronavirus Screening Covid test on 03/18/22 was negative. Do you have any of the following symptoms:  Cough Yes Fever (>100.1F)  yes/no: No Runny nose yes/no: No Sore throat yes/no: No Difficulty breathing/shortness of breath  Yes  Have you traveled in the last 14 days and where? yes/no: No  Patient verbalized understanding of instructions that were given tvia phone.

## 2022-03-22 ENCOUNTER — Ambulatory Visit (HOSPITAL_BASED_OUTPATIENT_CLINIC_OR_DEPARTMENT_OTHER): Payer: Medicare Other | Admitting: Vascular Surgery

## 2022-03-22 ENCOUNTER — Encounter (HOSPITAL_COMMUNITY)
Admission: RE | Disposition: A | Discharge status: Home / Self Care | Payer: Self-pay | Source: Home / Self Care | Attending: Pulmonary Disease

## 2022-03-22 ENCOUNTER — Ambulatory Visit (HOSPITAL_COMMUNITY): Payer: Medicare Other

## 2022-03-22 ENCOUNTER — Other Ambulatory Visit: Payer: Self-pay

## 2022-03-22 ENCOUNTER — Encounter (HOSPITAL_COMMUNITY): Payer: Self-pay | Admitting: Pulmonary Disease

## 2022-03-22 ENCOUNTER — Encounter: Payer: Self-pay | Admitting: Vascular Surgery

## 2022-03-22 ENCOUNTER — Ambulatory Visit (HOSPITAL_COMMUNITY): Payer: Medicare Other | Admitting: Vascular Surgery

## 2022-03-22 ENCOUNTER — Ambulatory Visit (HOSPITAL_COMMUNITY)
Admission: RE | Admit: 2022-03-22 | Discharge: 2022-03-22 | Disposition: A | Discharge status: Home / Self Care | Payer: Medicare Other | Attending: Pulmonary Disease | Admitting: Pulmonary Disease

## 2022-03-22 DIAGNOSIS — Z9889 Other specified postprocedural states: Secondary | ICD-10-CM | POA: Diagnosis not present

## 2022-03-22 DIAGNOSIS — B1920 Unspecified viral hepatitis C without hepatic coma: Secondary | ICD-10-CM | POA: Diagnosis not present

## 2022-03-22 DIAGNOSIS — F17210 Nicotine dependence, cigarettes, uncomplicated: Secondary | ICD-10-CM | POA: Insufficient documentation

## 2022-03-22 DIAGNOSIS — R911 Solitary pulmonary nodule: Secondary | ICD-10-CM

## 2022-03-22 DIAGNOSIS — F17200 Nicotine dependence, unspecified, uncomplicated: Secondary | ICD-10-CM | POA: Diagnosis not present

## 2022-03-22 DIAGNOSIS — I10 Essential (primary) hypertension: Secondary | ICD-10-CM

## 2022-03-22 DIAGNOSIS — R918 Other nonspecific abnormal finding of lung field: Secondary | ICD-10-CM | POA: Diagnosis not present

## 2022-03-22 HISTORY — DX: Unspecified cirrhosis of liver: K74.60

## 2022-03-22 HISTORY — DX: Hyperlipidemia, unspecified: E78.5

## 2022-03-22 HISTORY — PX: VIDEO BRONCHOSCOPY WITH ENDOBRONCHIAL ULTRASOUND: SHX6177

## 2022-03-22 HISTORY — PX: BRONCHIAL BIOPSY: SHX5109

## 2022-03-22 HISTORY — PX: BRONCHIAL NEEDLE ASPIRATION BIOPSY: SHX5106

## 2022-03-22 LAB — COMPREHENSIVE METABOLIC PANEL
ALT: 23 U/L (ref 0–44)
AST: 29 U/L (ref 15–41)
Albumin: 3.9 g/dL (ref 3.5–5.0)
Alkaline Phosphatase: 54 U/L (ref 38–126)
Anion gap: 10 (ref 5–15)
BUN: 7 mg/dL — ABNORMAL LOW (ref 8–23)
CO2: 22 mmol/L (ref 22–32)
Calcium: 9.4 mg/dL (ref 8.9–10.3)
Chloride: 105 mmol/L (ref 98–111)
Creatinine, Ser: 1.08 mg/dL (ref 0.61–1.24)
GFR, Estimated: >60 mL/min (ref >60)
Glucose, Bld: 103 mg/dL — ABNORMAL HIGH (ref 70–99)
Potassium: 3.9 mmol/L (ref 3.5–5.1)
Sodium: 137 mmol/L (ref 135–145)
Total Bilirubin: 0.6 mg/dL (ref 0.3–1.2)
Total Protein: 7.7 g/dL (ref 6.5–8.1)

## 2022-03-22 LAB — CBC
HCT: 46.6 % (ref 39.0–52.0)
Hemoglobin: 16 g/dL (ref 13.0–17.0)
MCH: 31.1 pg (ref 26.0–34.0)
MCHC: 34.3 g/dL (ref 30.0–36.0)
MCV: 90.7 fL (ref 80.0–100.0)
Platelets: 257 10*3/uL (ref 150–400)
RBC: 5.14 MIL/uL (ref 4.22–5.81)
RDW: 14.5 % (ref 11.5–15.5)
WBC: 4.4 10*3/uL (ref 4.0–10.5)
nRBC: 0 % (ref 0.0–0.2)

## 2022-03-22 SURGERY — BRONCHOSCOPY, WITH BIOPSY USING ELECTROMAGNETIC NAVIGATION
Anesthesia: General | Laterality: Right

## 2022-03-22 MED ORDER — SUGAMMADEX SODIUM 200 MG/2ML IV SOLN
INTRAVENOUS | Status: DC | PRN
  Administered 2022-03-22: 600 mg via INTRAVENOUS

## 2022-03-22 MED ORDER — LIDOCAINE 2% (20 MG/ML) 5 ML SYRINGE
INTRAMUSCULAR | Status: DC | PRN
  Administered 2022-03-22: 30 mg via INTRAVENOUS

## 2022-03-22 MED ORDER — DEXAMETHASONE SODIUM PHOSPHATE 10 MG/ML IJ SOLN
INTRAMUSCULAR | Status: DC | PRN
  Administered 2022-03-22: 10 mg via INTRAVENOUS

## 2022-03-22 MED ORDER — ACETAMINOPHEN 500 MG PO TABS
1000.0000 mg | ORAL_TABLET | Freq: Once | ORAL | Status: AC
  Administered 2022-03-22: 1000 mg via ORAL
  Filled 2022-03-22: qty 2

## 2022-03-22 MED ORDER — FENTANYL CITRATE (PF) 100 MCG/2ML IJ SOLN
INTRAMUSCULAR | Status: DC | PRN
  Administered 2022-03-22: 100 ug via INTRAVENOUS

## 2022-03-22 MED ORDER — MIDAZOLAM HCL 2 MG/2ML IJ SOLN
INTRAMUSCULAR | Status: DC | PRN
  Administered 2022-03-22: 2 mg via INTRAVENOUS

## 2022-03-22 MED ORDER — SUCCINYLCHOLINE CHLORIDE 200 MG/10ML IV SOSY
PREFILLED_SYRINGE | INTRAVENOUS | Status: DC | PRN
  Administered 2022-03-22: 150 mg via INTRAVENOUS

## 2022-03-22 MED ORDER — LACTATED RINGERS IV SOLN: INTRAVENOUS | Status: DC

## 2022-03-22 MED ORDER — ONDANSETRON HCL 4 MG/2ML IJ SOLN
INTRAMUSCULAR | Status: DC | PRN
  Administered 2022-03-22: 4 mg via INTRAVENOUS

## 2022-03-22 MED ORDER — ROCURONIUM BROMIDE 10 MG/ML (PF) SYRINGE
PREFILLED_SYRINGE | INTRAVENOUS | Status: DC | PRN
  Administered 2022-03-22: 10 mg via INTRAVENOUS
  Administered 2022-03-22: 40 mg via INTRAVENOUS

## 2022-03-22 MED ORDER — CHLORHEXIDINE GLUCONATE 0.12 % MT SOLN
OROMUCOSAL | Status: AC
  Administered 2022-03-22: 15 mL
  Filled 2022-03-22: qty 15

## 2022-03-22 MED ORDER — PROPOFOL 10 MG/ML IV BOLUS
INTRAVENOUS | Status: DC | PRN
  Administered 2022-03-22: 120 mg via INTRAVENOUS

## 2022-03-22 SURGICAL SUPPLY — 30 items
BRUSH CYTOL CELLEBRITY 1.5X140 (MISCELLANEOUS) IMPLANT
CANISTER SUCT 3000ML PPV (MISCELLANEOUS) ×3 IMPLANT
CONT SPEC 4OZ CLIKSEAL STRL BL (MISCELLANEOUS) ×3 IMPLANT
COVER BACK TABLE 60X90IN (DRAPES) ×3 IMPLANT
COVER DOME SNAP 22 D (MISCELLANEOUS) ×3 IMPLANT
FORCEPS BIOP RJ4 1.8 (CUTTING FORCEPS) IMPLANT
GAUZE SPONGE 4X4 12PLY STRL (GAUZE/BANDAGES/DRESSINGS) ×3 IMPLANT
GLOVE BIO SURGEON STRL SZ7.5 (GLOVE) ×3 IMPLANT
GOWN STRL REUS W/ TWL LRG LVL3 (GOWN DISPOSABLE) ×3 IMPLANT
GOWN STRL REUS W/TWL LRG LVL3 (GOWN DISPOSABLE) ×3
KIT CLEAN ENDO COMPLIANCE (KITS) ×6 IMPLANT
KIT TURNOVER KIT B (KITS) ×3 IMPLANT
MARKER SKIN DUAL TIP RULER LAB (MISCELLANEOUS) ×3 IMPLANT
NDL EBUS SONO TIP PENTAX (NEEDLE) ×3 IMPLANT
NEEDLE EBUS SONO TIP PENTAX (NEEDLE) ×3 IMPLANT
NS IRRIG 1000ML POUR BTL (IV SOLUTION) ×3 IMPLANT
OIL SILICONE PENTAX (PARTS (SERVICE/REPAIRS)) ×3 IMPLANT
PAD ARMBOARD 7.5X6 YLW CONV (MISCELLANEOUS) ×6 IMPLANT
SOL ANTI FOG 6CC (MISCELLANEOUS) ×3 IMPLANT
SOLUTION ANTI FOG 6CC (MISCELLANEOUS) ×3
SYR 20CC LL (SYRINGE) ×6 IMPLANT
SYR 20ML ECCENTRIC (SYRINGE) ×6 IMPLANT
SYR 50ML SLIP (SYRINGE) IMPLANT
SYR 5ML LUER SLIP (SYRINGE) ×3 IMPLANT
TOWEL OR 17X24 6PK STRL BLUE (TOWEL DISPOSABLE) ×3 IMPLANT
TRAP SPECIMEN MUCOUS 40CC (MISCELLANEOUS) IMPLANT
TUBE CONNECTING 20X1/4 (TUBING) ×6 IMPLANT
UNDERPAD 30X30 (UNDERPADS AND DIAPERS) ×3 IMPLANT
VALVE DISPOSABLE (MISCELLANEOUS) ×3 IMPLANT
WATER STERILE IRR 1000ML POUR (IV SOLUTION) ×3 IMPLANT

## 2022-03-22 NOTE — Discharge Instructions (Signed)
Flexible Bronchoscopy, Care After This sheet gives you information about how to care for yourself after your test. Your doctor may also give you more specific instructions. If you have problems or questions, contact your doctor. Follow these instructions at home: Eating and drinking Do not eat or drink anything (not even water) for 2 hours after your test, or until your numbing medicine (local anesthetic) wears off. When your numbness is gone and your cough and gag reflexes have come back, you may: Eat only soft foods. Slowly drink liquids. The day after the test, go back to your normal diet. Driving Do not drive for 24 hours if you were given a medicine to help you relax (sedative). Do not drive or use heavy machinery while taking prescription pain medicine. General instructions  Take over-the-counter and prescription medicines only as told by your doctor. Return to your normal activities as told. Ask what activities are safe for you. Do not use any products that have nicotine or tobacco in them. This includes cigarettes and e-cigarettes. If you need help quitting, ask your doctor. Keep all follow-up visits as told by your doctor. This is important. It is very important if you had a tissue sample (biopsy) taken. Get help right away if: You have shortness of breath that gets worse. You get light-headed. You feel like you are going to pass out (faint). You have chest pain. You cough up: More than a little blood. More blood than before. Summary Do not eat or drink anything (not even water) for 2 hours after your test, or until your numbing medicine wears off. Do not use cigarettes. Do not use e-cigarettes. Get help right away if you have chest pain.  This information is not intended to replace advice given to you by your health care provider. Make sure you discuss any questions you have with your health care provider. Document Released: 03/06/2009 Document Revised: 04/21/2017 Document  Reviewed: 05/27/2016 Elsevier Patient Education  2020 [employer].

## 2022-03-22 NOTE — Transfer of Care (Signed)
Immediate Anesthesia Transfer of Care Note  Patient: JALYNN BETZOLD  Procedure(s) Performed: ROBOTIC ASSISTED NAVIGATIONAL BRONCHOSCOPY (Right) VIDEO BRONCHOSCOPY WITH ENDOBRONCHIAL ULTRASOUND (Bilateral) BRONCHIAL NEEDLE ASPIRATION BIOPSIES BRONCHIAL BIOPSIES  Patient Location: Endoscopy Unit  Anesthesia Type:General  Level of Consciousness: drowsy and patient cooperative  Airway & Oxygen Therapy: Patient Spontanous Breathing and Patient connected to nasal cannula oxygen  Post-op Assessment: Report given to RN and Post -op Vital signs reviewed and stable  Post vital signs: Reviewed and stable  Last Vitals:  Vitals Value Taken Time  BP 151/93 03/22/22 1400  Temp    Pulse 92 03/22/22 1401  Resp 23 03/22/22 1401  SpO2 94 % 03/22/22 1401  Vitals shown include unvalidated device data.  Last Pain:  Vitals:   03/22/22 1131  TempSrc:   PainSc: 0-No pain         Complications: No notable events documented.

## 2022-03-22 NOTE — Anesthesia Procedure Notes (Signed)
Procedure Name: Intubation Date/Time: 03/22/2022 1:03 PM  Performed by: Moshe Salisbury, CRNAPre-anesthesia Checklist: Patient identified, Emergency Drugs available, Suction available and Patient being monitored Patient Re-evaluated:Patient Re-evaluated prior to induction Oxygen Delivery Method: Circle System Utilized Preoxygenation: Pre-oxygenation with 100% oxygen Induction Type: IV induction Ventilation: Mask ventilation with difficulty Laryngoscope Size: Mac and 4 Grade View: Grade III Tube type: Oral Tube size: 8.5 mm Number of attempts: 1 Airway Equipment and Method: Stylet Placement Confirmation: ETT inserted through vocal cords under direct vision, positive ETCO2 and breath sounds checked- equal and bilateral Secured at: 21 cm Tube secured with: Tape Dental Injury: Teeth and Oropharynx as per pre-operative assessment

## 2022-03-22 NOTE — Interval H&P Note (Signed)
History and Physical Interval Note:  03/22/2022 12:42 PM  Wyatt Berry  has presented today for surgery, with the diagnosis of lung nodule.  The various methods of treatment have been discussed with the patient and family. After consideration of risks, benefits and other options for treatment, the patient has consented to  Procedure(s) with comments: ROBOTIC ASSISTED NAVIGATIONAL BRONCHOSCOPY (Right) - ION w/ CIOS VIDEO BRONCHOSCOPY WITH ENDOBRONCHIAL ULTRASOUND (Bilateral) as a surgical intervention.  The patient's history has been reviewed, patient examined, no change in status, stable for surgery.  I have reviewed the patient's chart and labs.  Questions were answered to the patient's satisfaction.     [place]

## 2022-03-22 NOTE — Op Note (Signed)
Video Bronchoscopy with Robotic Assisted Bronchoscopic Navigation   Date of Operation: 03/22/2022   Pre-op Diagnosis: Right middle lobe lung nodule  Post-op Diagnosis: Right middle lobe lung nodule  Surgeon: Garner Nash, DO  Assistants: None   Anesthesia: General endotracheal anesthesia  Operation: Flexible video fiberoptic bronchoscopy with robotic assistance and biopsies.  Estimated Blood Loss: Minimal  Complications: None  Indications and History: Wyatt Berry is a 71 y.o. male with history of RML lung nodule. The risks, benefits, complications, treatment options and expected outcomes were discussed with the patient.  The possibilities of pneumothorax, pneumonia, reaction to medication, pulmonary aspiration, perforation of a viscus, bleeding, failure to diagnose a condition and creating a complication requiring transfusion or operation were discussed with the patient who freely signed the consent.    Description of Procedure: The patient was seen in the Preoperative Area, was examined and was deemed appropriate to proceed.  The patient was taken to [facility] endoscopy room 3, identified as Wyatt Berry and the procedure verified as Flexible Video Fiberoptic Bronchoscopy.  A Time Out was held and the above information confirmed.   Prior to the date of the procedure a high-resolution CT scan of the chest was performed. Utilizing ION software program a virtual tracheobronchial tree was generated to allow the creation of distinct navigation pathways to the patient's parenchymal abnormalities. After being taken to the operating room general anesthesia was initiated and the patient  was orally intubated. The video fiberoptic bronchoscope was introduced via the endotracheal tube and a general inspection was performed which showed normal right and left lung anatomy, aspiration of the bilateral mainstems was completed to remove any remaining secretions. Robotic catheter inserted into patient's  endotracheal tube.   Target #1 RML lung nodule: The distinct navigation pathways prepared prior to this procedure were then utilized to navigate to patient's lesion identified on CT scan. The robotic catheter was secured into place and the vision probe was withdrawn.  Lesion location was approximated using fluoroscopy and 3d CBCT. Under fluoroscopic guidance transbronchial needle brushings, transbronchial needle biopsies, and transbronchial forceps biopsies were performed to be sent for cytology and pathology.   Following the navigational portion of the procedure swapped out from a standard therapeutic scope to the curvilinear endobronchial ultrasound scope.  We used this to examine the precarinal space, subcarinal space and bilateral hilum.  There was no evidence of any significant adenopathy.  Decision was made to not biopsy.  At the end of the procedure a general airway inspection was performed and there was no evidence of active bleeding. The bronchoscope was removed.  The patient tolerated the procedure well. There was no significant blood loss and there were no obvious complications. A post-procedural chest x-ray is pending.  Samples Target #1: 1. Transbronchial Wang needle biopsies from RML lung nodule  2. Transbronchial forceps biopsies from RML lung nodule  Plans:  The patient will be discharged from the PACU to home when recovered from anesthesia and after chest x-ray is reviewed. We will review the cytology, pathology results with the patient when they become available. Outpatient followup will be with Garner Nash, DO.   Garner Nash, DO [facility] 03/22/2022 2:04 PM

## 2022-03-22 NOTE — Anesthesia Postprocedure Evaluation (Signed)
Anesthesia Post Note  Patient: Wyatt Berry  Procedure(s) Performed: ROBOTIC ASSISTED NAVIGATIONAL BRONCHOSCOPY (Right) VIDEO BRONCHOSCOPY WITH ENDOBRONCHIAL ULTRASOUND (Bilateral) BRONCHIAL NEEDLE ASPIRATION BIOPSIES BRONCHIAL BIOPSIES     Patient location during evaluation: Endoscopy Anesthesia Type: General Level of consciousness: awake and alert, patient cooperative and oriented Pain management: pain level controlled Vital Signs Assessment: post-procedure vital signs reviewed and stable Respiratory status: spontaneous breathing, nonlabored ventilation and respiratory function stable Cardiovascular status: blood pressure returned to baseline and stable Postop Assessment: no apparent nausea or vomiting Anesthetic complications: no   No notable events documented.  Last Vitals:  Vitals:   03/22/22 1440 03/22/22 1450  BP: 132/76 115/70  Pulse: 86 84  Resp: 18 (!) 21  Temp:    SpO2: 93% 95%    Last Pain:  Vitals:   03/22/22 1450  TempSrc:   PainSc: 0-No pain                 Kiyan Burmester,E. Hasaan Radde

## 2022-03-23 ENCOUNTER — Encounter (HOSPITAL_COMMUNITY): Payer: Self-pay | Admitting: Pulmonary Disease

## 2022-03-24 LAB — CYTOLOGY - NON PAP

## 2022-04-01 ENCOUNTER — Ambulatory Visit (INDEPENDENT_AMBULATORY_CARE_PROVIDER_SITE_OTHER): Payer: Medicare Other | Admitting: Acute Care

## 2022-04-01 ENCOUNTER — Encounter: Payer: Self-pay | Admitting: Acute Care

## 2022-04-01 ENCOUNTER — Telehealth: Payer: Self-pay | Admitting: Pulmonary Disease

## 2022-04-01 VITALS — BP 120/84 | HR 81 | Temp 98.7°F | Ht 73.0 in | Wt 236.4 lb

## 2022-04-01 DIAGNOSIS — R911 Solitary pulmonary nodule: Secondary | ICD-10-CM | POA: Diagnosis not present

## 2022-04-01 DIAGNOSIS — F17210 Nicotine dependence, cigarettes, uncomplicated: Secondary | ICD-10-CM

## 2022-04-01 DIAGNOSIS — F17200 Nicotine dependence, unspecified, uncomplicated: Secondary | ICD-10-CM

## 2022-04-01 MED ORDER — VARENICLINE TARTRATE 0.5 MG PO TABS: ORAL_TABLET | ORAL | 0 refills | Status: DC

## 2022-04-01 NOTE — Telephone Encounter (Signed)
Pt called the office stating that he missed a phone call he believes about scheduling a CT. Routing to PCCs. Please advise.

## 2022-04-01 NOTE — Patient Instructions (Addendum)
It is good to see you today. Your Biopsy did not show any malignant cells.  Follow up with Dr. Silas Flood 11/14 for PFT and then office visit to discuss treatment options  moving forward.  Please work on quitting smoking as soon as possible. This is very important.  Call 1-800-QUIT NOW for nicotine replacement therapy.  I will send in Chantix starter pack today. This should help you to quit smoking.  Follow up CT chest in 3 months to re-evaluate nodule noted on Super D Scan.  Please contact office for sooner follow up if symptoms do not improve or worsen or seek emergency care   Call us if you need [place]. .   Varenicline -- Varenicline (brand name: Chantix) is a prescription medication that works in the brain to reduce nicotine withdrawal symptoms and cigarette cravings. In several studies, it was more effective than placebo (a lookalike substitute that contains no medication.)  It should be taken after eating with a full glass of water as follows: ?One 0.5 mg tablet daily for three days ?One 0.5 mg tablet twice daily for the next four days ?One 1.0 mg tablet twice daily starting at day 7  You should plan to quit smoking between one and four weeks after starting varenicline.   You should continue it for 12 weeks before concluding if it is working; if you successfully quit at 12 weeks, you may continue taking it for an additional 12 weeks. If you have not quit after taking varenicline for 12 weeks, talk to your health care provider about the next step. Options include working harder to make behavioral changes and continuing varenicline or switching to another treatment.  Common side effects of varenicline include nausea and abnormal dreams.  In the very early years of Chantix there were some concerns about Chantix affecting mood and depression.  However, there has been lots of research on this and this is no longer a concern.  Chantix is considered safe to use even in patients taking medication for  depression.   In 2011, the [place] Food and Drug Administration (FDA) issued an advisory that, in people who already have heart or blood vessel disease, varenicline may increase the risk of acute heart problems. If there is such a risk, it appears to be small, and is likely outweighed by the benefits of quitting smoking.

## 2022-04-01 NOTE — Progress Notes (Signed)
History of Present Illness Wyatt Berry is a 71 y.o. male current every day smoker with history of RML lung nodule. He underwent  Flexible video fiberoptic bronchoscopy with robotic assistance and biopsies on 03/22/2022. He is followed by Dr. Valeta Harms.   04/01/2022 Pt. Presents for follow up after bronch. He states he has done well since the procedure. He did not cough up any blood. He did have a sore throat for 3 days which self resolved.  We discussed that he needs to quit smoking. I have asked him to call 1-800-QUIT NOW for free nicotine replacement therapy. He has PFT's scheduled for 11/14, with follow up Dr. Silas Flood  We discussed that the biopsy did not show malignant cells. I told him while that is reassuring it still needs follow up, as PET scan was concerning for bronchogenic carcinoma in the RML. .There was notation of  a 12 mm short axis left subpectoral lymph node has increased in size from 6 mm previously imaged. This may be reactive and did not show hypermetabolism on the recent PET-CT. We will order the 3 month follow up CT Chest recommended by radiology.I will discuss with Dr. Valeta Harms to see if he feels we need to refer to surgery.    Test Results: A. LUNG, RML, FINE NEEDLE ASPIRATION: 03/22/2022 There are no malignant cells.  Normal bronchial epithelial cells are present.  The cellblock contains fragments of normal lung and bronchial  epithelium.      Latest Ref Rng & Units 03/22/2022   12:03 PM 09/18/2020   10:08 AM 05/06/2016    2:29 AM  CBC  WBC 4.0 - 10.5 K/uL 4.4  5.3  8.9   Hemoglobin 13.0 - 17.0 g/dL 16.0  13.2  12.0   Hematocrit 39.0 - 52.0 % 46.6  41.2  35.8   Platelets 150 - 400 K/uL 257  323  306        Latest Ref Rng & Units 03/22/2022   12:03 PM 05/03/2016    2:24 AM 05/01/2016   10:34 PM  BMP  Glucose 70 - 99 mg/dL 103  119  162   BUN 8 - 23 mg/dL '7  8  13   '$ Creatinine 0.61 - 1.24 mg/dL 1.08  0.97  1.17   Sodium 135 - 145 mmol/L 137  134  132    Potassium 3.5 - 5.1 mmol/L 3.9  3.9  3.7   Chloride 98 - 111 mmol/L 105  102  100   CO2 22 - 32 mmol/L '22  21  21   '$ Calcium 8.9 - 10.3 mg/dL 9.4  8.5  8.7     BNP    Component Value Date/Time   BNP 74.6 05/01/2016 2304    ProBNP No results found for: "PROBNP"  PFT No results found for: "FEV1PRE", "FEV1POST", "FVCPRE", "FVCPOST", "TLC", "DLCOUNC", "PREFEV1FVCRT", "PSTFEV1FVCRT"  DG Chest Port 1 View  Result Date: 03/22/2022 CLINICAL DATA:  Status post bronchoscopic biopsy. EXAM: PORTABLE CHEST 1 VIEW COMPARISON:  None Available. FINDINGS: The cardiac silhouette, mediastinal hilar contours are within normal limits and stable. No complicating features associated with the bronchoscopy. No evidence of hemorrhage/hematoma or pneumothorax. IMPRESSION: No post bronchoscopic complicating features identified. Electronically Signed   By: Marijo Sanes M.D.   On: 03/22/2022 14:53   DG C-ARM BRONCHOSCOPY  Result Date: 03/22/2022 C-ARM BRONCHOSCOPY: Fluoroscopy was utilized by the requesting physician.  No radiographic interpretation.   CT Super D Chest Wo Contrast  Result Date: 03/17/2022 CLINICAL DATA:  Pulmonary nodule.  Preoperative planning. EXAM: CT CHEST WITHOUT CONTRAST TECHNIQUE: Multidetector CT imaging of the chest was performed using thin slice collimation for electromagnetic bronchoscopy planning purposes, without intravenous contrast. RADIATION DOSE REDUCTION: This exam was performed according to the departmental dose-optimization program which includes automated exposure control, adjustment of the mA and/or kV according to patient size and/or use of iterative reconstruction technique. COMPARISON:  01/28/2022 FINDINGS: Cardiovascular: The heart size is normal. No substantial pericardial effusion. Coronary artery calcification is evident. Mild atherosclerotic calcification is noted in the wall of the thoracic aorta. Mediastinum/Nodes: No mediastinal lymphadenopathy. No evidence for  gross hilar lymphadenopathy although assessment is limited by the lack of intravenous contrast on the current study. The esophagus has normal imaging features. There is no right axillary lymphadenopathy. 12 mm short axis left subpectoral lymph node (image 10/series 3) has increased in size from 6 mm previously. Lungs/Pleura: Centrilobular emphsyema noted. Amorphous posterior right middle lobe lesion again noted. This measures approximately 3.1 x 2.7 cm on image 41/4 today. Measurements did not persist on the prior PET-CT or prior chest CT in so the lesion is remeasured today on image 197/9 of the 01/28/2022 exam. Measuring at a similar level, the lesion is 3.1 x 2.4 cm. 13 mm left lower lobe nodule on 36/4 is similar to prior. No new suspicious pulmonary nodule or mass. Subsegmental atelectasis noted in the dependent lung bases. Upper Abdomen: Nodular hepatic contour suggests cirrhosis. Musculoskeletal: No worrisome lytic or sclerotic osseous abnormality. IMPRESSION: 1. Amorphous posterior right middle lobe lesion measures approximately 3.1 x 2.7 cm today, stable in the interval. This represents the lesion seen to be hypermetabolic on previous PET-CT. 2. 13 mm left lower lobe nodule is similar to prior. No hypermetabolism noted in this nodule on prior PET-CT. 3. 12 mm short axis left subpectoral lymph node has increased in size from 6 mm previously. This may be reactive and did not show hypermetabolism on the recent PET-CT. Repeat CT chest in 3 months could be used to ensure stability. 4. Nodular hepatic contour suggests cirrhosis. 5.  Aortic Atherosclerosis (ICD10-I70.0). Electronically Signed   By: Misty Stanley M.D.   On: 03/17/2022 13:27   NM PET Image Initial (PI) Skull Base To Thigh  Result Date: 03/10/2022 CLINICAL DATA:  Initial treatment strategy for pulmonary nodules. Smoking history. EXAM: NUCLEAR MEDICINE PET SKULL BASE TO THIGH TECHNIQUE: 11.7 mCi F-18 FDG was injected intravenously. Full-ring PET  imaging was performed from the skull base to thigh after the radiotracer. CT data was obtained and used for attenuation correction and anatomic localization. Fasting blood glucose: 109 mg/dl COMPARISON:  CT on 01/28/2022 FINDINGS: Mediastinal blood-pool activity (background): SUV max = 2.7 Liver activity (reference): SUV max = N/A NECK:  No hypermetabolic lymph nodes or masses. Incidental CT findings:  None. CHEST: No hypermetabolic lymph nodes. A sub-solid nodule again seen in the posterior right middle lobe which measures 2.8 x 1.7 cm. This has a more focal solid nodular component measuring 13 mm on image 48/7, which is hypermetabolic with SUV max of 3.2. A solid circumscribed pulmonary nodule in the superior segment of the left lower lobe is again seen measuring 11 mm. This nodule shows no significant FDG uptake, suggesting a benign granuloma. Incidental CT findings: Mild-to-moderate centrilobular emphysema. Aortic and coronary atherosclerotic calcification incidentally noted. ABDOMEN/PELVIS: No abnormal hypermetabolic activity within the liver, pancreas, adrenal glands, or spleen. No hypermetabolic lymph nodes in the abdomen or pelvis. Incidental CT findings: 3.7 cm infrarenal abdominal aortic aneurysm. Mildly  enlarged prostate. SKELETON: No focal hypermetabolic bone lesions to suggest skeletal metastasis. Incidental CT findings:  None. IMPRESSION: Sub-solid pulmonary nodule in posterior right middle lobe is hypermetabolic, suspicious for bronchogenic carcinoma. 11 mm solid nodule in superior left lower lobe shows no hypermetabolic activity, suggesting a benign granuloma. Recommend continued attention on follow-up imaging. No evidence of metastatic disease. 3.7 cm infrarenal abdominal aortic aneurysm. Recommend follow-up ultrasound every 2 years. This recommendation follows ACR consensus guidelines: White Paper of the ACR Incidental Findings Committee II on Vascular Findings. J Am Coll Radiol 2013; 10:789-794.  Aortic Atherosclerosis (ICD10-I70.0) and Emphysema (ICD10-J43.9). Electronically Signed   By: Marlaine Hind M.D.   On: 03/10/2022 16:36     Past medical hx Past Medical History:  Diagnosis Date   AAA (abdominal aortic aneurysm) without rupture ([place]) 01/01/2021   Cirrhosis of liver ([facility])    DVT (deep venous thrombosis) ([place]) 05/02/2016   subacute LLE DVT 05/02/16   Hepatitis C    HCV, s/p treatment   HLD (hyperlipidemia)    Hypertension    Lung nodule 02/23/2022   left lung   PE (pulmonary thromboembolism) ([place]) 05/02/2016   bilateral at least submassive PE with right heart strain, s/p Xarelto x 4 years   Pre-diabetes 12/2020   no meds     Social History   Tobacco Use   Smoking status: Every Day    Packs/day: 0.50    Types: Cigarettes    Start date: 1973   Smokeless tobacco: Never   Tobacco comments:    Pt states that he smokes about 5 cigarettes a day. 02/23/22 ALS   Vaping Use   Vaping Use: Never used  Substance Use Topics   Alcohol use: No   Drug use: No    Mr.Miskell reports that he has been smoking cigarettes. He started smoking about 50 years ago. He has been smoking an average of .5 packs per day. He has never used smokeless tobacco. He reports that he does not drink alcohol and does not use drugs.  Tobacco Cessation: Current every day smoker , smoking 5 cigarettes a day, has a 25 pack year smoking history  Past surgical hx, Family hx, Social hx all reviewed.  Current Outpatient Medications on File Prior to Visit  Medication Sig   amLODipine (NORVASC) 2.5 MG tablet Take 1 tablet (2.5 mg total) by mouth daily. (Patient taking differently: Take 5 mg by mouth daily.)   aspirin EC 81 MG tablet Take 81 mg by mouth daily.   atorvastatin (LIPITOR) 10 MG tablet Take 10 mg by mouth daily.   losartan-hydrochlorothiazide (HYZAAR) 100-12.5 MG per tablet Take 1 tablet by mouth daily.   metoprolol succinate (TOPROL-XL) 25 MG 24 hr tablet Take 25 mg by mouth at bedtime.    Multiple Vitamin (MULTIVITAMIN) tablet Take 1 tablet by mouth daily.   tamsulosin (FLOMAX) 0.4 MG CAPS capsule Take 0.4 mg by mouth daily after supper.   No current facility-administered medications on file prior to visit.     No Known Allergies  Review Of Systems:  Constitutional:   No  weight loss, night sweats,  Fevers, chills, fatigue, or  lassitude.  HEENT:   No headaches,  Difficulty swallowing,  Tooth/dental problems, or  Sore throat,                No sneezing, itching, ear ache, nasal congestion, post nasal drip,   CV:  No chest pain,  Orthopnea, PND, swelling in lower extremities, anasarca, dizziness, palpitations, syncope.  GI  No heartburn, indigestion, abdominal pain, nausea, vomiting, diarrhea, change in bowel habits, loss of appetite, bloody stools.   Resp: No shortness of breath with exertion or at rest.  No excess mucus, no productive cough,  No non-productive cough,  No coughing up of blood.  No change in color of mucus.  No wheezing.  No chest wall deformity  Skin: no rash or lesions.  GU: no dysuria, change in color of urine, no urgency or frequency.  No flank pain, no hematuria   MS:  No joint pain or swelling.  No decreased range of motion.  No back pain.  Psych:  No change in mood or affect. No depression or anxiety.  No memory loss.   Vital Signs BP 120/84 (BP Location: Left Arm, Cuff Size: Normal)   Pulse 81   Temp 98.7 F (37.1 C) (Oral)   Ht '6\' 1"'$  (1.854 m)   Wt 236 lb 6.4 oz (107.2 kg)   SpO2 98%   BMI 31.19 kg/m    Physical Exam:  General- No distress,  A&Ox3, pleasant ENT: No sinus tenderness, TM clear, pale nasal mucosa, no oral exudate,no post nasal drip, no LAN Cardiac: S1, S2, regular rate and rhythm, no murmur Chest: No wheeze/ rales/ dullness; no accessory muscle use, no nasal flaring, no sternal retractions Abd.: Soft Non-tender, ND, BS +, Body mass index is 31.19 kg/m.  Ext: No clubbing cyanosis, edema Neuro:  normal strength,  MAE x 4, A&O x 3 Skin: No rashes, warm and dry, no lesions  Psych: normal mood and behavior   Assessment/Plan Lung Nodule Hypermetabolic on PET  Biopsy negative for malignant cells Plan Your Biopsy did not show any malignant cells.  Follow up with Dr. Silas Flood 11/14 for PFT and then office visit to discuss treatment options  moving forward.  Please work on quitting smoking as soon as possible. This is very important.  Call 1-800-QUIT NOW for nicotine replacement therapy.  I will send in Chantix starter pack today. This should help you to quit smoking.  Follow up CT chest in 3 months to re-evaluate nodule noted on Super D Scan.  Please contact office for sooner follow up if symptoms do not improve or worsen or seek emergency care   Call us if you need [place]. .   Varenicline -- Varenicline (brand name: Chantix) is a prescription medication that works in the brain to reduce nicotine withdrawal symptoms and cigarette cravings. In several studies, it was more effective than placebo (a lookalike substitute that contains no medication.)  It should be taken after eating with a full glass of water as follows: ?One 0.5 mg tablet daily for three days ?One 0.5 mg tablet twice daily for the next four days ?One 1.0 mg tablet twice daily starting at day 7  You should plan to quit smoking between one and four weeks after starting varenicline.    I spent 35 minutes dedicated to the care of this patient on the date of this encounter to include pre-visit review of records, face-to-face time with the patient discussing conditions above, post visit ordering of testing, clinical documentation with the electronic health record, making appropriate referrals as documented, and communicating necessary information to the patient's healthcare team.   Magdalen Spatz, NP 04/01/2022  8:38 AM

## 2022-04-05 ENCOUNTER — Ambulatory Visit (INDEPENDENT_AMBULATORY_CARE_PROVIDER_SITE_OTHER): Payer: Medicare Other | Admitting: Pulmonary Disease

## 2022-04-05 ENCOUNTER — Encounter: Payer: Self-pay | Admitting: Pulmonary Disease

## 2022-04-05 DIAGNOSIS — F17210 Nicotine dependence, cigarettes, uncomplicated: Secondary | ICD-10-CM

## 2022-04-05 DIAGNOSIS — F17200 Nicotine dependence, unspecified, uncomplicated: Secondary | ICD-10-CM

## 2022-04-05 DIAGNOSIS — J432 Centrilobular emphysema: Secondary | ICD-10-CM

## 2022-04-05 DIAGNOSIS — R918 Other nonspecific abnormal finding of lung field: Secondary | ICD-10-CM | POA: Diagnosis not present

## 2022-04-05 DIAGNOSIS — R911 Solitary pulmonary nodule: Secondary | ICD-10-CM

## 2022-04-05 LAB — PULMONARY FUNCTION TEST
DL/VA % pred: 80 %
DL/VA: 3.23 ml/min/mmHg/L
DLCO cor % pred: 66 %
DLCO cor: 18.91 ml/min/mmHg
DLCO unc % pred: 69 %
DLCO unc: 19.62 ml/min/mmHg
FEF 25-75 Post: 1.2 L/sec
FEF 25-75 Pre: 1.16 L/sec
FEF2575-%Change-Post: 3 %
FEF2575-%Pred-Post: 43 %
FEF2575-%Pred-Pre: 41 %
FEV1-%Change-Post: 1 %
FEV1-%Pred-Post: 60 %
FEV1-%Pred-Pre: 59 %
FEV1-Post: 2.2 L
FEV1-Pre: 2.16 L
FEV1FVC-%Change-Post: 1 %
FEV1FVC-%Pred-Pre: 87 %
FEV6-%Change-Post: 0 %
FEV6-%Pred-Post: 71 %
FEV6-%Pred-Pre: 71 %
FEV6-Post: 3.36 L
FEV6-Pre: 3.33 L
FEV6FVC-%Change-Post: 0 %
FEV6FVC-%Pred-Post: 104 %
FEV6FVC-%Pred-Pre: 104 %
FVC-%Change-Post: 0 %
FVC-%Pred-Post: 68 %
FVC-%Pred-Pre: 68 %
FVC-Post: 3.39 L
FVC-Pre: 3.37 L
Post FEV1/FVC ratio: 65 %
Post FEV6/FVC ratio: 99 %
Pre FEV1/FVC ratio: 64 %
Pre FEV6/FVC Ratio: 99 %
RV % pred: 77 %
RV: 2.02 L
TLC % pred: 73 %
TLC: 5.65 L

## 2022-04-05 MED ORDER — VARENICLINE TARTRATE (STARTER) 0.5 MG X 11 & 1 MG X 42 PO TBPK: ORAL_TABLET | ORAL | 0 refills | Status: AC

## 2022-04-05 NOTE — Patient Instructions (Signed)
Nice to see you again  Based on the result of your scan we will discuss next steps.  If growing we will either send a referral to the surgeon or to radiation oncology for radiation.  It has gotten smaller or gone away that would be great.  I sent the prescription for Chantix to your pharmacy.  Take as prescribed.  Return to clinic in 2 months or sooner as needed with Dr. Silas Flood

## 2022-04-05 NOTE — Progress Notes (Signed)
Full PFT performed today. °

## 2022-04-05 NOTE — Patient Instructions (Signed)
Full PFT performed today. °

## 2022-04-05 NOTE — Progress Notes (Signed)
$'@Patient'K$  ID: Wyatt Berry, male    DOB: Oct 27, 1950, 71 y.o.   MRN: [id number]  Chief Complaint  Patient presents with   Follow-up    Pt is here for follow up for lung nodule. Pt is wanting to go over PET scan results and the scan was 03/09/22. Pt also had PFT done today     Referring provider: Wenda Low, MD  HPI:   71 y.o. man whom are seen in consultation for follow up of pulmonary nodule discovered on lung cancer screening CT scan. Most recent pulmonary clinic note from Eric Form, NP reviewed.  At last visit, PET scan was obtained.  This showed avidity in the right middle lobe but none in the left lower lobe.  Proceed with bronchoscopy with navigational robotic bronchoscopy and biopsy.  This was negative for malignancy.  Had PFTs performed today which showed moderate COPD and moderately reduced DLCO.    Long discussion about PET avid right middle lobe nodule and next steps today in clinic.  HPI at initial visit: Patient usual state of health.  After discussion with PCP underwent lung cancer screening.  This was performed 01/2022.  On my review interpretation there is a area of left upper lobe nodularity/scarring, left lower lobe greater than 1 cm rounded solid lesion and additional right middle lobe rounded solid lesion about 1 cm.  This prompted referral.  He gets a little short of breath exertion at times.  Nothing out of the ordinary.  Minimal cough.  Overall feels fine.  Has decreased his cigarette intake since this finding.  Down to 5 cigarettes a day from half a pack a day prior to the CT scan.  We discussed at length the CT findings and concern for possible malignancy.  The 2 distinct nodules to be a bit atypical but possible for synchronous primaries versus 2 distinct pathologies.  Discussed additional imaging via PET scan would be helpful in terms of deciding neck steps and he agrees to this.  PMH: Tobacco abuse, hypertension Surgical history: Past Surgical History:   Procedure Laterality Date   BRONCHIAL BIOPSY  03/22/2022   Procedure: BRONCHIAL BIOPSIES;  Surgeon: Garner Nash, DO;  Location: [facility];  Service: Pulmonary;;   BRONCHIAL NEEDLE ASPIRATION BIOPSY  03/22/2022   Procedure: BRONCHIAL NEEDLE ASPIRATION BIOPSIES;  Surgeon: Garner Nash, DO;  Location: [facility];  Service: Pulmonary;;   COLONOSCOPY     VIDEO BRONCHOSCOPY WITH ENDOBRONCHIAL ULTRASOUND Bilateral 03/22/2022   Procedure: VIDEO BRONCHOSCOPY WITH ENDOBRONCHIAL ULTRASOUND;  Surgeon: Garner Nash, DO;  Location: [facility];  Service: Pulmonary;  Laterality: Bilateral;   Family history: Mother with diabetes, CHF, brother with CAD Social history: Current smoker, down to a few cigarettes a day, historically half a pack per day, lives in [place] / Pulmonary Flowsheets:   ACT:      No data to display          MMRC:     No data to display          [facility]:      No data to display          Tests:   FENO:  No results found for: "NITRICOXIDE"  PFT:    Latest Ref Rng & Units 04/05/2022   11:32 AM  PFT Results  FVC-Pre L 3.37  P  FVC-Predicted Pre % 68  P  FVC-Post L 3.39  P  FVC-Predicted Post % 68  P  Pre FEV1/FVC % %  64  P  Post FEV1/FCV % % 65  P  FEV1-Pre L 2.16  P  FEV1-Predicted Pre % 59  P  FEV1-Post L 2.20  P  DLCO uncorrected ml/min/mmHg 19.62  P  DLCO UNC% % 69  P  DLCO corrected ml/min/mmHg 18.91  P  DLCO COR %Predicted % 66  P  DLVA Predicted % 80  P  TLC L 5.65  P  TLC % Predicted % 73  P  RV % Predicted % 77  P    P Preliminary result  Personally reviewed and interpreted as moderate fixed obstruction, no bronchodilator response, lung volumes within normal limits, moderately reduced DLCO  WALK:      No data to display          Imaging: Personally reviewed and as per EMR discussion this note DG Chest Port 1 View  Result Date: 03/22/2022 CLINICAL DATA:  Status post bronchoscopic biopsy.  EXAM: PORTABLE CHEST 1 VIEW COMPARISON:  None Available. FINDINGS: The cardiac silhouette, mediastinal hilar contours are within normal limits and stable. No complicating features associated with the bronchoscopy. No evidence of hemorrhage/hematoma or pneumothorax. IMPRESSION: No post bronchoscopic complicating features identified. Electronically Signed   By: Marijo Sanes M.D.   On: 03/22/2022 14:53   DG C-ARM BRONCHOSCOPY  Result Date: 03/22/2022 C-ARM BRONCHOSCOPY: Fluoroscopy was utilized by the requesting physician.  No radiographic interpretation.   CT Super D Chest Wo Contrast  Result Date: 03/17/2022 CLINICAL DATA:  Pulmonary nodule.  Preoperative planning. EXAM: CT CHEST WITHOUT CONTRAST TECHNIQUE: Multidetector CT imaging of the chest was performed using thin slice collimation for electromagnetic bronchoscopy planning purposes, without intravenous contrast. RADIATION DOSE REDUCTION: This exam was performed according to the departmental dose-optimization program which includes automated exposure control, adjustment of the mA and/or kV according to patient size and/or use of iterative reconstruction technique. COMPARISON:  01/28/2022 FINDINGS: Cardiovascular: The heart size is normal. No substantial pericardial effusion. Coronary artery calcification is evident. Mild atherosclerotic calcification is noted in the wall of the thoracic aorta. Mediastinum/Nodes: No mediastinal lymphadenopathy. No evidence for gross hilar lymphadenopathy although assessment is limited by the lack of intravenous contrast on the current study. The esophagus has normal imaging features. There is no right axillary lymphadenopathy. 12 mm short axis left subpectoral lymph node (image 10/series 3) has increased in size from 6 mm previously. Lungs/Pleura: Centrilobular emphsyema noted. Amorphous posterior right middle lobe lesion again noted. This measures approximately 3.1 x 2.7 cm on image 41/4 today. Measurements did not  persist on the prior PET-CT or prior chest CT in so the lesion is remeasured today on image 197/9 of the 01/28/2022 exam. Measuring at a similar level, the lesion is 3.1 x 2.4 cm. 13 mm left lower lobe nodule on 36/4 is similar to prior. No new suspicious pulmonary nodule or mass. Subsegmental atelectasis noted in the dependent lung bases. Upper Abdomen: Nodular hepatic contour suggests cirrhosis. Musculoskeletal: No worrisome lytic or sclerotic osseous abnormality. IMPRESSION: 1. Amorphous posterior right middle lobe lesion measures approximately 3.1 x 2.7 cm today, stable in the interval. This represents the lesion seen to be hypermetabolic on previous PET-CT. 2. 13 mm left lower lobe nodule is similar to prior. No hypermetabolism noted in this nodule on prior PET-CT. 3. 12 mm short axis left subpectoral lymph node has increased in size from 6 mm previously. This may be reactive and did not show hypermetabolism on the recent PET-CT. Repeat CT chest in 3 months could be used to ensure  stability. 4. Nodular hepatic contour suggests cirrhosis. 5.  Aortic Atherosclerosis (ICD10-I70.0). Electronically Signed   By: Misty Stanley M.D.   On: 03/17/2022 13:27   NM PET Image Initial (PI) Skull Base To Thigh  Result Date: 03/10/2022 CLINICAL DATA:  Initial treatment strategy for pulmonary nodules. Smoking history. EXAM: NUCLEAR MEDICINE PET SKULL BASE TO THIGH TECHNIQUE: 11.7 mCi F-18 FDG was injected intravenously. Full-ring PET imaging was performed from the skull base to thigh after the radiotracer. CT data was obtained and used for attenuation correction and anatomic localization. Fasting blood glucose: 109 mg/dl COMPARISON:  CT on 01/28/2022 FINDINGS: Mediastinal blood-pool activity (background): SUV max = 2.7 Liver activity (reference): SUV max = N/A NECK:  No hypermetabolic lymph nodes or masses. Incidental CT findings:  None. CHEST: No hypermetabolic lymph nodes. A sub-solid nodule again seen in the posterior  right middle lobe which measures 2.8 x 1.7 cm. This has a more focal solid nodular component measuring 13 mm on image 48/7, which is hypermetabolic with SUV max of 3.2. A solid circumscribed pulmonary nodule in the superior segment of the left lower lobe is again seen measuring 11 mm. This nodule shows no significant FDG uptake, suggesting a benign granuloma. Incidental CT findings: Mild-to-moderate centrilobular emphysema. Aortic and coronary atherosclerotic calcification incidentally noted. ABDOMEN/PELVIS: No abnormal hypermetabolic activity within the liver, pancreas, adrenal glands, or spleen. No hypermetabolic lymph nodes in the abdomen or pelvis. Incidental CT findings: 3.7 cm infrarenal abdominal aortic aneurysm. Mildly enlarged prostate. SKELETON: No focal hypermetabolic bone lesions to suggest skeletal metastasis. Incidental CT findings:  None. IMPRESSION: Sub-solid pulmonary nodule in posterior right middle lobe is hypermetabolic, suspicious for bronchogenic carcinoma. 11 mm solid nodule in superior left lower lobe shows no hypermetabolic activity, suggesting a benign granuloma. Recommend continued attention on follow-up imaging. No evidence of metastatic disease. 3.7 cm infrarenal abdominal aortic aneurysm. Recommend follow-up ultrasound every 2 years. This recommendation follows ACR consensus guidelines: White Paper of the ACR Incidental Findings Committee II on Vascular Findings. J Am Coll Radiol 2013; 10:789-794. Aortic Atherosclerosis (ICD10-I70.0) and Emphysema (ICD10-J43.9). Electronically Signed   By: Marlaine Hind M.D.   On: 03/10/2022 16:36    Lab Results: Personally reviewed CBC    Component Value Date/Time   WBC 4.4 03/22/2022 1203   RBC 5.14 03/22/2022 1203   HGB 16.0 03/22/2022 1203   HCT 46.6 03/22/2022 1203   PLT 257 03/22/2022 1203   MCV 90.7 03/22/2022 1203   MCH 31.1 03/22/2022 1203   MCHC 34.3 03/22/2022 1203   RDW 14.5 03/22/2022 1203   LYMPHSABS 2.4 09/18/2020 1008    MONOABS 0.4 09/18/2020 1008   EOSABS 0.0 09/18/2020 1008   BASOSABS 0.0 09/18/2020 1008    BMET    Component Value Date/Time   NA 137 03/22/2022 1203   K 3.9 03/22/2022 1203   CL 105 03/22/2022 1203   CO2 22 03/22/2022 1203   GLUCOSE 103 (H) 03/22/2022 1203   BUN 7 (L) 03/22/2022 1203   CREATININE 1.08 03/22/2022 1203   CALCIUM 9.4 03/22/2022 1203   GFRNONAA >60 03/22/2022 1203   GFRAA >60 05/03/2016 0224    BNP    Component Value Date/Time   BNP 74.6 05/01/2016 2304    ProBNP No results found for: "PROBNP"  Specialty Problems       Pulmonary Problems   Acute respiratory failure with hypoxia (HCC)   Lung nodule    No Known Allergies  Immunization History  Administered Date(s) Administered   Fluad Quad(high  Dose 65+) 02/24/2019   Influenza, High Dose Seasonal PF 04/05/2018   PFIZER(Purple Top)SARS-COV-2 Vaccination 06/29/2019, 07/20/2019   Pneumococcal Conjugate-13 06/08/2017   Pneumococcal Polysaccharide-23 02/21/2013   Tdap 02/21/2013   Zoster Recombinat (Shingrix) 06/16/2018, 03/11/2019   Zoster, Live 10/28/2014, 06/16/2018, 03/11/2019    Past Medical History:  Diagnosis Date   AAA (abdominal aortic aneurysm) without rupture ([place]) 01/01/2021   Cirrhosis of liver (HCC)    DVT (deep venous thrombosis) ([place]) 05/02/2016   subacute LLE DVT 05/02/16   Hepatitis C    HCV, s/p treatment   HLD (hyperlipidemia)    Hypertension    Lung nodule 02/23/2022   left lung   PE (pulmonary thromboembolism) ([place]) 05/02/2016   bilateral at least submassive PE with right heart strain, s/p Xarelto x 4 years   Pre-diabetes 12/2020   no meds    Tobacco History: Social History   Tobacco Use  Smoking Status Every Day   Packs/day: 0.50   Years: 50.00   Total pack years: 25.00   Types: Cigarettes   Start date: 1973  Smokeless Tobacco Never  Tobacco Comments   Pt states that he smokes less than 5 cigarettes a day. 04/05/22 ALS    Ready to quit: Not  Answered Counseling given: Not Answered Tobacco comments: Pt states that he smokes less than 5 cigarettes a day. 04/05/22 ALS      Outpatient Encounter Medications as of 04/05/2022  Medication Sig   amLODipine (NORVASC) 2.5 MG tablet Take 1 tablet (2.5 mg total) by mouth daily. (Patient taking differently: Take 5 mg by mouth daily.)   aspirin EC 81 MG tablet Take 81 mg by mouth daily.   atorvastatin (LIPITOR) 10 MG tablet Take 10 mg by mouth daily.   losartan-hydrochlorothiazide (HYZAAR) 100-12.5 MG per tablet Take 1 tablet by mouth daily.   metoprolol succinate (TOPROL-XL) 25 MG 24 hr tablet Take 25 mg by mouth at bedtime.   Multiple Vitamin (MULTIVITAMIN) tablet Take 1 tablet by mouth daily.   tamsulosin (FLOMAX) 0.4 MG CAPS capsule Take 0.4 mg by mouth daily after supper.   Varenicline Tartrate, Starter, (CHANTIX STARTING MONTH PAK) 0.5 MG X 11 & 1 MG X 42 TBPK Take 0.5 mg by mouth daily for 3 days, THEN 0.5 mg 2 (two) times daily for 4 days, THEN 1 mg 2 (two) times daily for 21 days.   [DISCONTINUED] varenicline (CHANTIX) 0.5 MG tablet It should be taken after eating with a full glass of water as follows: ?One 0.5 mg tablet daily for three days ?One 0.5 mg tablet twice daily for the next four days ?One 1.0 mg tablet twice daily starting at day 7  You should plan to quit smoking between one and four weeks after starting varenicline. (Patient not taking: Reported on 04/05/2022)   No facility-administered encounter medications on file as of 04/05/2022.     Review of Systems  Review of Systems  N/a Physical Exam  BP 130/62 (BP Location: Left Arm, Patient Position: Sitting, Cuff Size: Normal)   Pulse 87   Temp 98.5 F (36.9 C) (Oral)   Ht '6\' 1"'$  (1.854 m)   Wt 234 lb (106.1 kg)   SpO2 99%   BMI 30.87 kg/m   Wt Readings from Last 5 Encounters:  04/05/22 234 lb (106.1 kg)  04/01/22 236 lb 6.4 oz (107.2 kg)  03/22/22 235 lb (106.6 kg)  02/23/22 235 lb 3.2 oz (106.7 kg)   08/20/20 248 lb 14.4 oz (112.9 kg)  BMI Readings from Last 5 Encounters:  04/05/22 30.87 kg/m  04/01/22 31.19 kg/m  03/22/22 31.00 kg/m  02/23/22 31.03 kg/m  08/20/20 31.96 kg/m     Physical Exam General: Sitting in chair, no acute distress Eyes: EOMI, no icterus Neck: Supple, no JVP Pulmonary: Clear, normal work of breathing Cardiovascular: Warm, no edema Abdomen: Nondistended, sounds present MSK: No synovitis, no joint effusion Neuro: Normal gait, no weakness Psych: Normal mood, full affect   Assessment & Plan:   Lung nodules: Rounded left lower lobe, right lower lobe as well as left upper lobe fibrotic appearing lesion. LLL not PET AVID, RML is PET avid. S/p robotic navigational bronchoscopy with out evidence of malignancy.  Given PET avidity and size, high suspicion for malignancy overall.  Discussed referral to thoracic surgery versus radiation oncology for empiric radiation to the area.  At this time, he wishes for ongoing surveillance imaging.  Another scan ordered for next month.  If growing and we will make referral at that time.  Notably he was encouraged to quit smoking prior to possible thoracic surgery evaluation.  COPD: Gold B.  Relatively minimal symptoms.  Moderate reduction in FEV1, 59%.  If thoracic surgery evaluation is needed, may benefit for additional testing prior to surgery if indicated.  Tobacco abuse: Smoking assessment and cessation counseling Patient currently smoking: I have advised the patient to quit/stop smoking as soon as possible due to high risk for multiple medical problems.  It will also be very difficult for [place] to manage patient's  respiratory symptoms and status if we continue to expose her lungs to a known irritant.  We do not advise e-cigarettes as a form of stopping smoking. Patient is  willing to quit smoking. I have advised the patient that we can assist and have options of nicotine replacement therapy, provided smoking cessation  education today, provided smoking cessation counseling, and provided cessation resources.  Stressed that he would need to stop smoking prior to surgical intervention if needed for possible lung cancer.  Chantix prescribed today.  Follow-up next office visit office visit for assessment of smoking cessation.  I spent 5 minutes in tobacco/smoking cessation counseling.   Return in about 2 months (around 06/05/2022).   Lanier Clam, MD 04/05/2022  I spent 41 minutes in the care of the patient including face-to-face visit, review of records, coordination of care.

## 2022-04-27 ENCOUNTER — Other Ambulatory Visit: Payer: Self-pay | Admitting: Nurse Practitioner

## 2022-04-27 DIAGNOSIS — K7469 Other cirrhosis of liver: Secondary | ICD-10-CM

## 2022-04-29 DIAGNOSIS — C678 Malignant neoplasm of overlapping sites of bladder: Secondary | ICD-10-CM | POA: Diagnosis not present

## 2022-04-29 DIAGNOSIS — R3121 Asymptomatic microscopic hematuria: Secondary | ICD-10-CM | POA: Diagnosis not present

## 2022-05-04 ENCOUNTER — Other Ambulatory Visit: Payer: Self-pay | Admitting: Urology

## 2022-05-05 ENCOUNTER — Ambulatory Visit
Admission: RE | Admit: 2022-05-05 | Discharge: 2022-05-05 | Disposition: A | Discharge status: Home / Self Care | Payer: Medicare Other | Source: Ambulatory Visit | Attending: Acute Care | Admitting: Acute Care

## 2022-05-05 DIAGNOSIS — J439 Emphysema, unspecified: Secondary | ICD-10-CM | POA: Diagnosis not present

## 2022-05-05 DIAGNOSIS — R911 Solitary pulmonary nodule: Secondary | ICD-10-CM | POA: Diagnosis not present

## 2022-05-05 DIAGNOSIS — I700 Atherosclerosis of aorta: Secondary | ICD-10-CM | POA: Diagnosis not present

## 2022-05-05 IMAGING — US US ABDOMEN LIMITED
1 series · 14 of 25 positions shown · non-contrast
Comparison: April 12, 2019

CLINICAL DATA: Cirrhosis

EXAM:
ULTRASOUND ABDOMEN LIMITED RIGHT UPPER QUADRANT

[Series 1: us abdomen limited · 0.28mm/px · 14 of 45 slices shown]
[im 1/45]
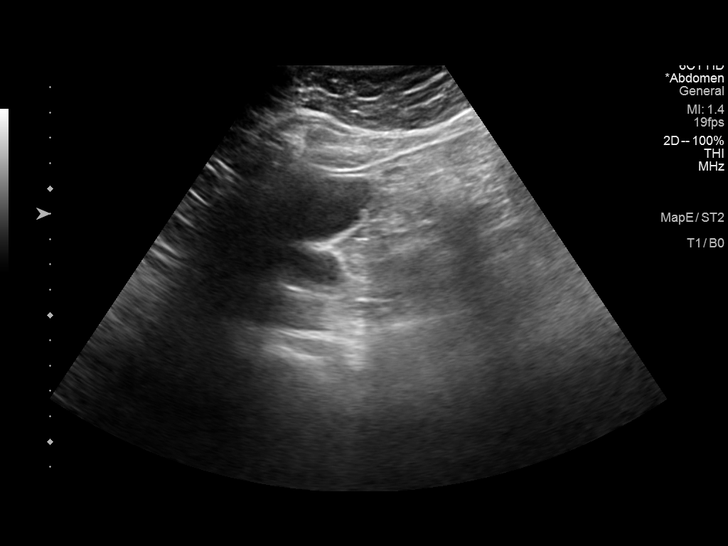
[im 4/45]
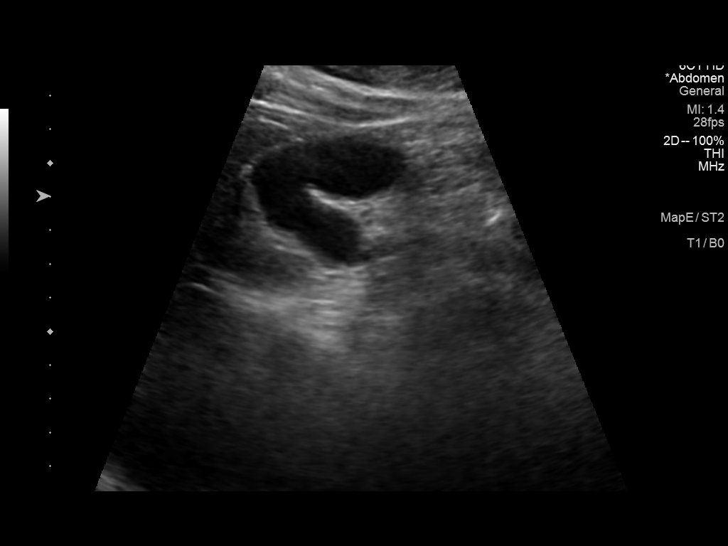
[im 8/45]
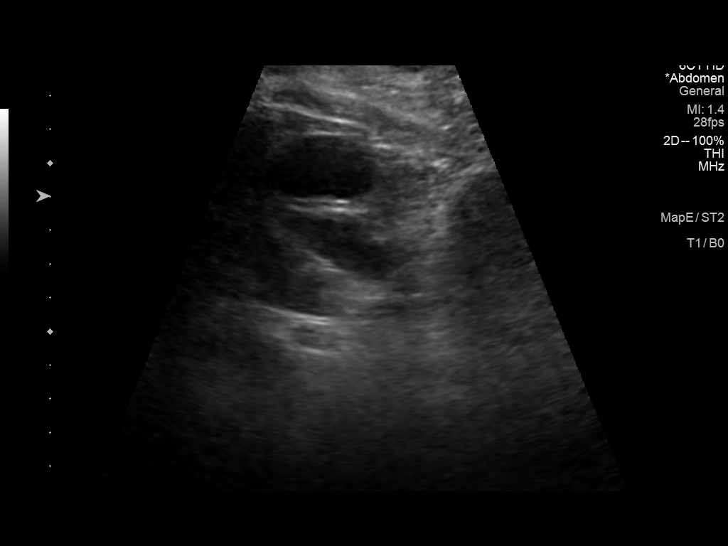
[im 12/45]
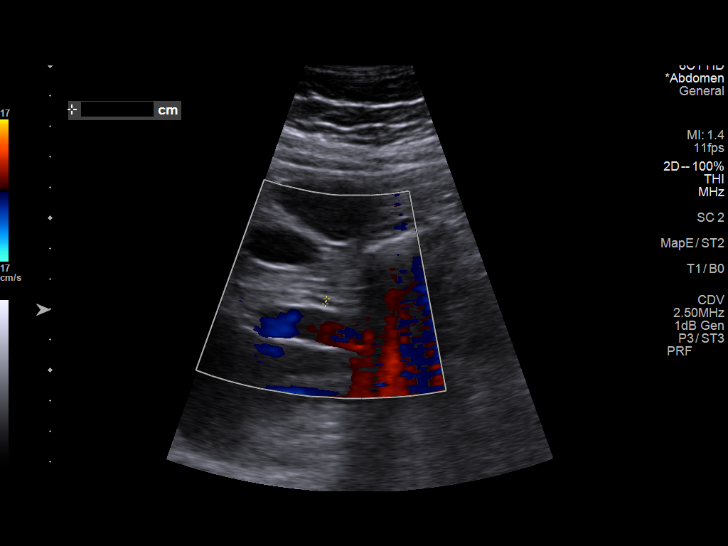
[im 15/45]
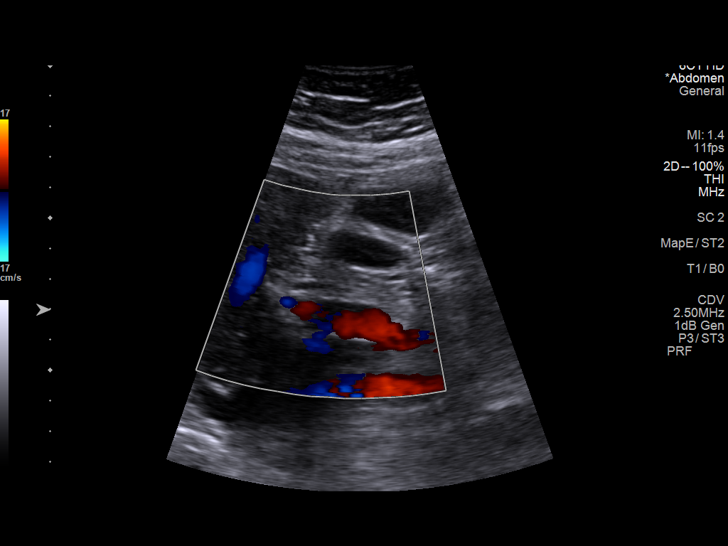
[im 17/45]
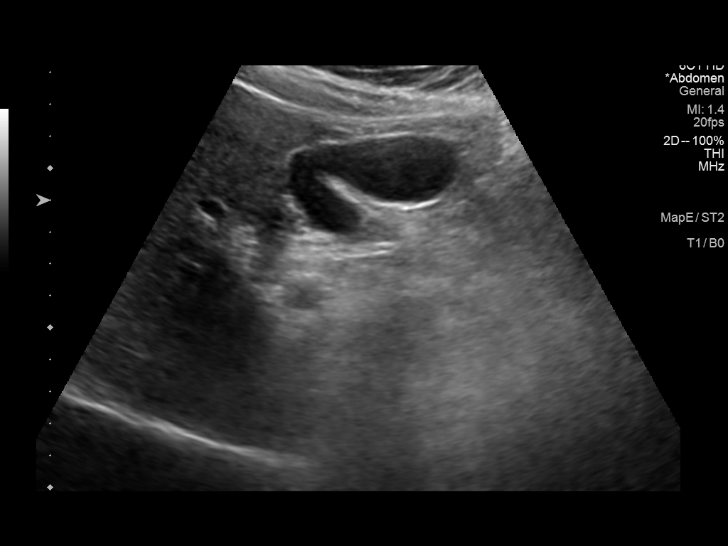
[im 21/45]
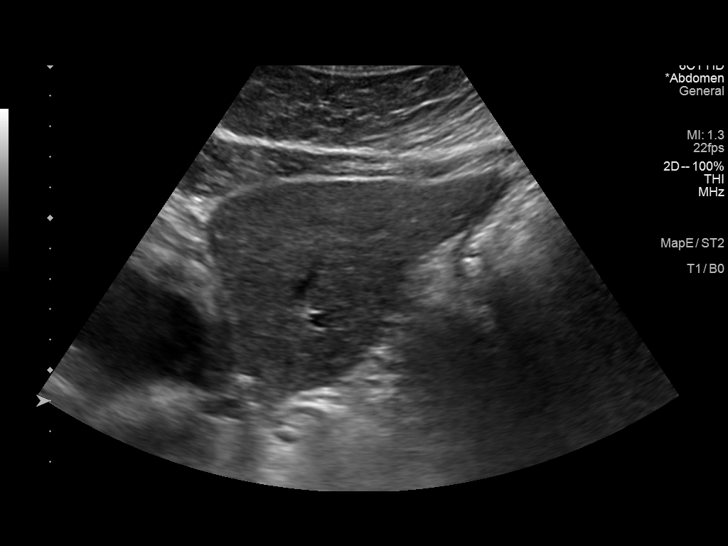
[im 24/45]
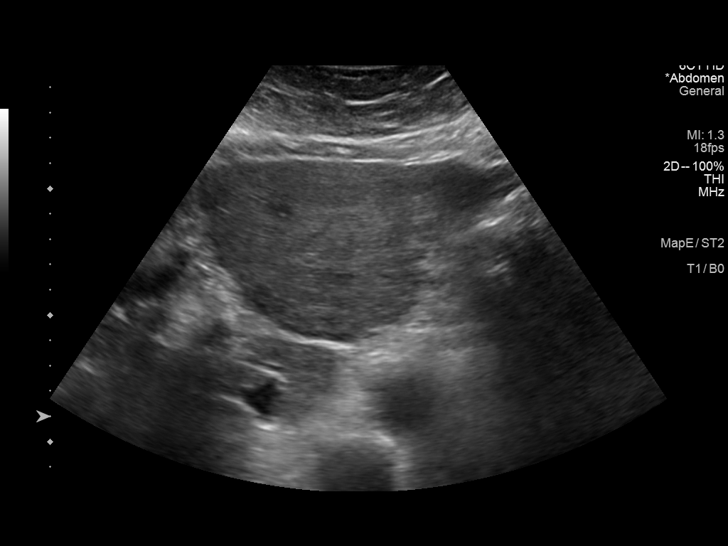
[im 28/45]
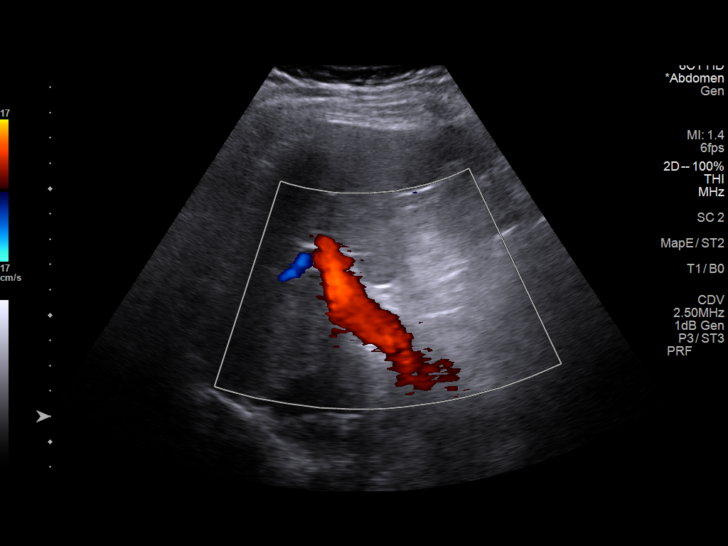
[im 30/45]
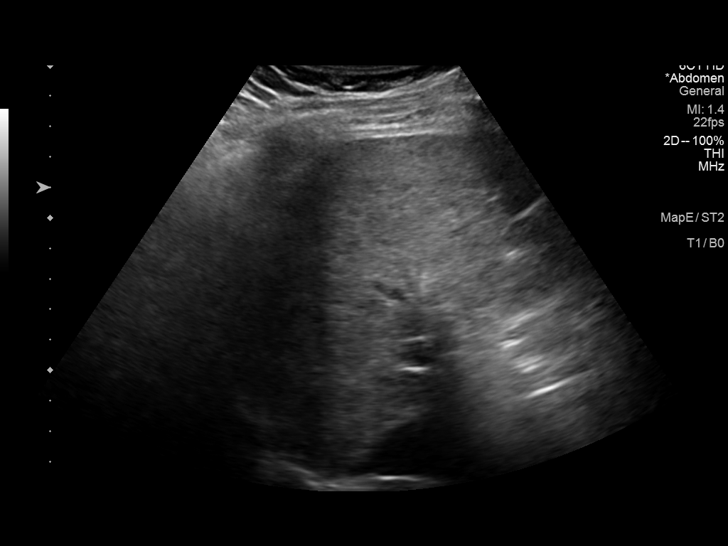
[im 34/45]
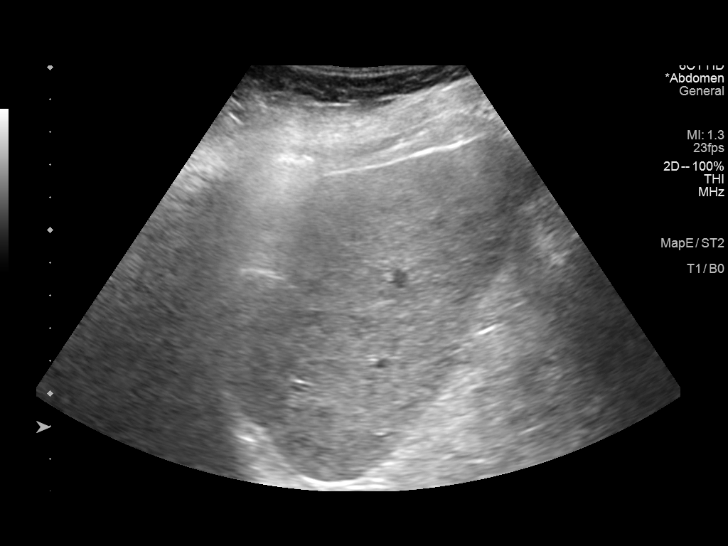
[im 37/45]
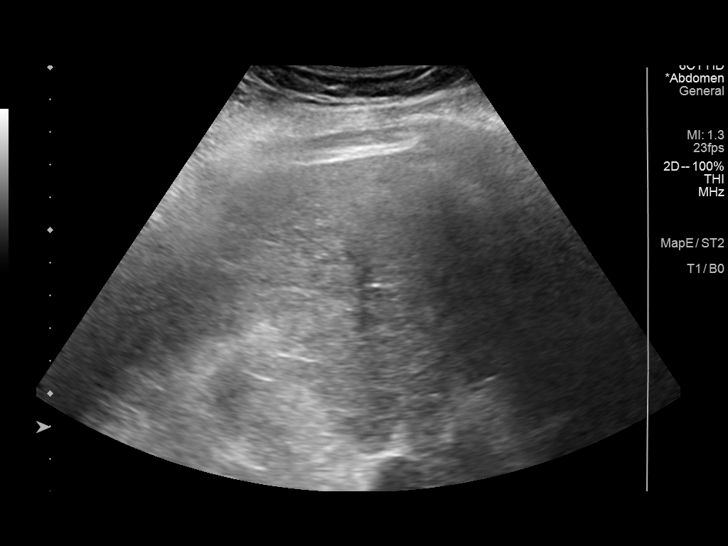
[im 41/45]
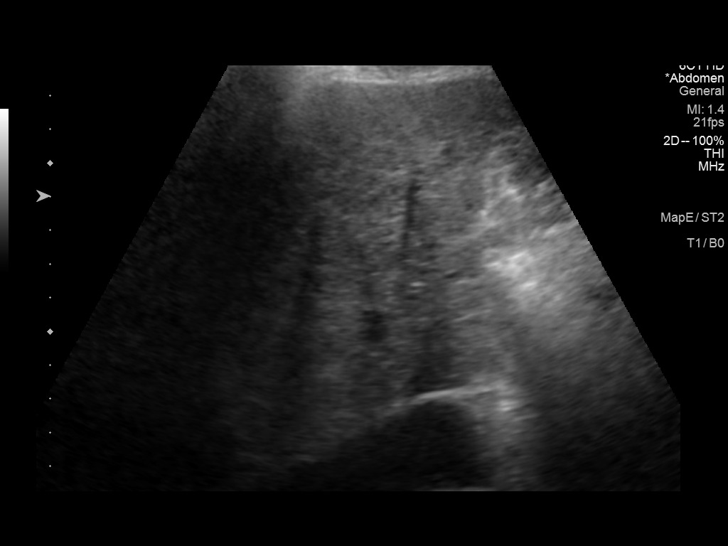
[im 45/45]
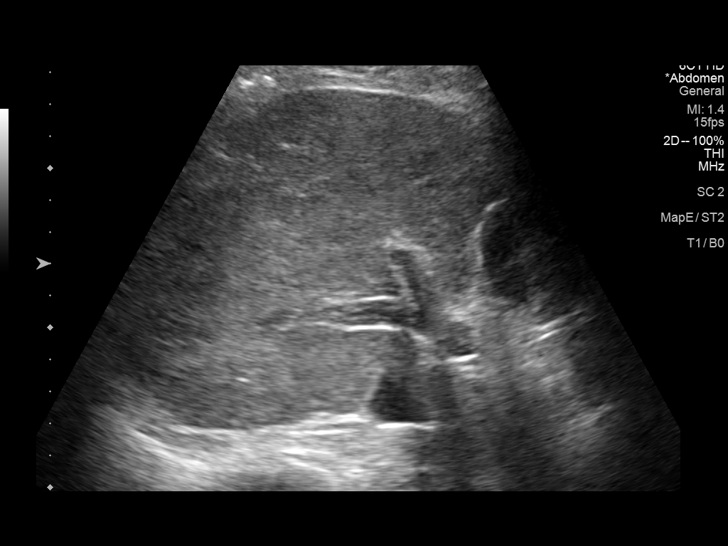

[14 of 25 positions shown; findings below may reference images not displayed]

FINDINGS: Gallbladder:

There is an apparent 3 mm gallbladder polyp. There is no gallbladder
wall thickening. The sonographic Murphy sign is negative. There are
no gallstones. Per consensus statement, gallbladder polyps less than
6 mm are usually benign not requiring follow-up

Common bile duct:

Diameter: 2 mm

Liver:

The liver parenchyma is coarsened and heterogeneous. Liver contour
is nodular. There is no discrete hepatic mass. Portal vein is patent
on color Doppler imaging with normal direction of blood flow towards
the liver.

Other: None.
IMPRESSION: 1. Cirrhosis without evidence for discrete hepatic mass.
2. No evidence for cholelithiasis.

## 2022-05-11 ENCOUNTER — Ambulatory Visit
Admission: RE | Admit: 2022-05-11 | Discharge: 2022-05-11 | Disposition: A | Discharge status: Home / Self Care | Payer: Medicare Other | Source: Ambulatory Visit | Attending: Nurse Practitioner | Admitting: Nurse Practitioner

## 2022-05-11 DIAGNOSIS — K7460 Unspecified cirrhosis of liver: Secondary | ICD-10-CM | POA: Diagnosis not present

## 2022-05-11 DIAGNOSIS — K7469 Other cirrhosis of liver: Secondary | ICD-10-CM

## 2022-05-11 DIAGNOSIS — K8020 Calculus of gallbladder without cholecystitis without obstruction: Secondary | ICD-10-CM | POA: Diagnosis not present

## 2022-05-11 NOTE — Patient Instructions (Signed)
DUE TO COVID-19 ONLY TWO VISITORS  (aged 70 and older)  ARE ALLOWED TO COME WITH YOU AND STAY IN THE WAITING ROOM ONLY DURING PRE OP AND PROCEDURE.   **NO VISITORS ARE ALLOWED IN THE SHORT STAY AREA OR RECOVERY ROOM!!**  IF YOU WILL BE ADMITTED INTO THE HOSPITAL YOU ARE ALLOWED ONLY FOUR SUPPORT PEOPLE DURING VISITATION HOURS ONLY (7 AM -8PM)   The support person(s) must pass our screening, gel in and out, and wear a mask at all times, including in the patient's room. Patients must also wear a mask when staff or their support person are in the room. Visitors GUEST BADGE MUST BE WORN VISIBLY  One adult visitor may remain with you overnight and MUST be in the room by 8 P.M.     Your procedure is scheduled on: 1/09/;24   Report to [facility] Main Entrance    Report to admitting at : 9:45 AM   Call this number if you have problems the morning of surgery [phone]   Do not eat food :After Midnight.   After Midnight you may have the following liquids until : 9:00 AM DAY OF SURGERY  Water Black Coffee (sugar ok, NO MILK/CREAM OR CREAMERS)  Tea (sugar ok, NO MILK/CREAM OR CREAMERS) regular and decaf                             Plain Jell-O (NO RED)                                           Fruit ices (not with fruit pulp, NO RED)                                     Popsicles (NO RED)                                                                  Juice: apple, WHITE grape, WHITE cranberry Sports drinks like Gatorade (NO RED)   Oral Hygiene is also important to reduce your risk of infection.                                    Remember - BRUSH YOUR TEETH THE MORNING OF SURGERY WITH YOUR REGULAR TOOTHPASTE  DENTURES WILL BE REMOVED PRIOR TO SURGERY PLEASE DO NOT APPLY "Poly grip" OR ADHESIVES!!!   Do NOT smoke after Midnight   Take these medicines the morning of surgery with A SIP OF WATER: amlodipine,varenicline.  DO NOT TAKE ANY ORAL DIABETIC MEDICATIONS DAY OF YOUR  SURGERY  Bring CPAP mask and tubing day of surgery.                              You may not have any metal on your body including hair pins, jewelry, and body piercing             Do not  wear lotions, powders, perfumes/cologne, or deodorant              Men may shave face and neck.   Do not bring valuables to the hospital. [place].   Contacts, glasses, or bridgework may not be worn into surgery.   Bring small overnight bag day of surgery.   DO NOT [place]. PHARMACY WILL DISPENSE MEDICATIONS LISTED ON YOUR MEDICATION LIST TO YOU DURING YOUR ADMISSION [place]!    Patients discharged on the day of surgery will not be allowed to drive home.  Someone NEEDS to stay with you for the first 24 hours after anesthesia.   Special Instructions: Bring a copy of your healthcare power of attorney and living will documents         the day of surgery if you haven't scanned them before.              Please read over the following fact sheets you were given: IF YOU HAVE QUESTIONS ABOUT YOUR PRE-OP INSTRUCTIONS PLEASE CALL [phone]    [facility] - Preparing for Surgery Before surgery, you can play an important role.  Because skin is not sterile, your skin needs to be as free of germs as possible.  You can reduce the number of germs on your skin by washing with CHG (chlorahexidine gluconate) soap before surgery.  CHG is an antiseptic cleaner which kills germs and bonds with the skin to continue killing germs even after washing. Please DO NOT use if you have an allergy to CHG or antibacterial soaps.  If your skin becomes reddened/irritated stop using the CHG and inform your nurse when you arrive at Short Stay. Do not shave (including legs and underarms) for at least 48 hours prior to the first CHG shower.  You may shave your face/neck. Please follow these instructions carefully:  1.  Shower with CHG Soap the  night before surgery and the  morning of Surgery.  2.  If you choose to wash your hair, wash your hair first as usual with your  normal  shampoo.  3.  After you shampoo, rinse your hair and body thoroughly to remove the  shampoo.                           4.  Use CHG as you would any other liquid soap.  You can apply chg directly  to the skin and wash                       Gently with a scrungie or clean washcloth.  5.  Apply the CHG Soap to your body ONLY FROM THE NECK DOWN.   Do not use on face/ open                           Wound or open sores. Avoid contact with eyes, ears mouth and genitals (private parts).                       Wash face,  Genitals (private parts) with your normal soap.             6.  Wash thoroughly, paying special attention to the area where your surgery  will be  performed.  7.  Thoroughly rinse your body with warm water from the neck down.  8.  DO NOT shower/wash with your normal soap after using and rinsing off  the CHG Soap.                9.  Pat yourself dry with a clean towel.            10.  Wear clean pajamas.            11.  Place clean sheets on your bed the night of your first shower and do not  sleep with pets. Day of Surgery : Do not apply any lotions/deodorants the morning of surgery.  Please wear clean clothes to the hospital/surgery center.  FAILURE TO FOLLOW THESE INSTRUCTIONS MAY RESULT IN THE CANCELLATION OF YOUR SURGERY PATIENT SIGNATURE_________________________________  NURSE SIGNATURE__________________________________  ________________________________________________________________________

## 2022-05-12 ENCOUNTER — Encounter (HOSPITAL_COMMUNITY): Payer: Self-pay

## 2022-05-12 ENCOUNTER — Encounter (HOSPITAL_COMMUNITY)
Admission: RE | Admit: 2022-05-12 | Discharge: 2022-05-12 | Disposition: A | Discharge status: Home / Self Care | Payer: Medicare Other | Source: Ambulatory Visit | Attending: Urology | Admitting: Urology

## 2022-05-12 ENCOUNTER — Other Ambulatory Visit: Payer: Self-pay

## 2022-05-12 VITALS — BP 151/88 | HR 74 | Temp 98.5°F | Ht 74.0 in | Wt 236.0 lb

## 2022-05-12 DIAGNOSIS — Z01812 Encounter for preprocedural laboratory examination: Secondary | ICD-10-CM | POA: Diagnosis not present

## 2022-05-12 DIAGNOSIS — I10 Essential (primary) hypertension: Secondary | ICD-10-CM | POA: Diagnosis not present

## 2022-05-12 DIAGNOSIS — R7303 Prediabetes: Secondary | ICD-10-CM | POA: Insufficient documentation

## 2022-05-12 HISTORY — DX: Malignant (primary) neoplasm, unspecified: C80.1

## 2022-05-12 LAB — CBC
HCT: 46.3 % (ref 39.0–52.0)
Hemoglobin: 15.4 g/dL (ref 13.0–17.0)
MCH: 30.3 pg (ref 26.0–34.0)
MCHC: 33.3 g/dL (ref 30.0–36.0)
MCV: 91.1 fL (ref 80.0–100.0)
Platelets: 233 10*3/uL (ref 150–400)
RBC: 5.08 MIL/uL (ref 4.22–5.81)
RDW: 14.2 % (ref 11.5–15.5)
WBC: 4.6 10*3/uL (ref 4.0–10.5)
nRBC: 0 % (ref 0.0–0.2)

## 2022-05-12 LAB — BASIC METABOLIC PANEL
Anion gap: 10 (ref 5–15)
BUN: 12 mg/dL (ref 8–23)
CO2: 23 mmol/L (ref 22–32)
Calcium: 9.3 mg/dL (ref 8.9–10.3)
Chloride: 104 mmol/L (ref 98–111)
Creatinine, Ser: 1.15 mg/dL (ref 0.61–1.24)
GFR, Estimated: >60 mL/min (ref >60)
Glucose, Bld: 100 mg/dL — ABNORMAL HIGH (ref 70–99)
Potassium: 4 mmol/L (ref 3.5–5.1)
Sodium: 137 mmol/L (ref 135–145)

## 2022-05-12 LAB — GLUCOSE, CAPILLARY: Glucose-Capillary: 107 mg/dL — ABNORMAL HIGH (ref 70–99)

## 2022-05-12 NOTE — Progress Notes (Signed)
For Short Stay: [place] appointment date:  Bowel Prep reminder:   For Anesthesia: PCP -Dr. Wenda Low  Cardiologist -   Chest x-ray - 03/22/22  CT chest: 05/06/22 EKG - 03/22/22 Stress Test -  ECHO - 05/02/16 Cardiac Cath -  Pacemaker/ICD device last checked: Pacemaker orders received: Device Rep notified:  Spinal Cord Stimulator:  Sleep Study -  CPAP -   Fasting Blood Sugar -  Checks Blood Sugar _____ times a day Date and result of last Hgb A1c-  Last dose of GLP1 agonist-  GLP1 instructions:   Last dose of SGLT-2 inhibitors-  SGLT-2 instructions:   Blood Thinner Instructions: Aspirin Instructions: Will be held 5 days before surgery. Last Dose:  Activity level: Can go up a flight of stairs and activities of daily living without stopping and without chest pain and/or shortness of breath   Able to exercise without chest pain and/or shortness of breath   Unable to go up a flight of stairs without chest pain and/or shortness of breath     Anesthesia review: Hx: HTN,Smoker.PE,Pre-DIA.  Patient denies shortness of breath, fever, cough and chest pain at PAT appointment   Patient verbalized understanding of instructions that were given to them at the PAT appointment. Patient was also instructed that they will need to review over the PAT instructions again at home before surgery.

## 2022-05-13 LAB — HEMOGLOBIN A1C
Hgb A1c MFr Bld: 6.3 % — ABNORMAL HIGH (ref 4.8–5.6)
Mean Plasma Glucose: 134 mg/dL

## 2022-05-24 ENCOUNTER — Encounter (HOSPITAL_COMMUNITY): Payer: Self-pay | Admitting: Physician Assistant

## 2022-05-24 ENCOUNTER — Telehealth: Payer: Self-pay | Admitting: Pulmonary Disease

## 2022-05-25 NOTE — Telephone Encounter (Signed)
Fax received from Dr. Harold Barban  with [facility] to perform a TRANSURETHRAL RESECTION OF BLADDER TUMOR on patient.  Patient needs surgery clearance. Surgery is 05/31/2022. Patient was seen on 04/05/2022. Office protocol is a risk assessment can be sent to surgeon if patient has been seen in 60 days or less.   Sending to Dr Silas Flood for risk assessment or recommendations if patient needs to be seen in office prior to surgical procedure.

## 2022-05-26 NOTE — Telephone Encounter (Signed)
OV notes and clearance form have been faxed back to [facility]. Nothing further needed at this time.

## 2022-05-26 NOTE — Telephone Encounter (Signed)
Pulmonary medicine does not provide preoperative clearance of other preoperative risk assessment.  Based on the ARISCAT model, patient is low at 1.3% risk of postoperative pulmonary complication assuming duration of surgery is less than or equal to 3 hours.  If duration of surgery is greater than 3 hours, patient is intermediate 13.3% risk of postoperative pulmonary complication.  There are no modifiable risk factors to assess prior to surgery.

## 2022-05-29 NOTE — Progress Notes (Signed)
Anesthesia Chart Review   Case: [id number] Date/Time: 05/31/22 1200   Procedures:      TRANSURETHRAL RESECTION OF BLADDER TUMOR (TURBT) - 11 MINS     CYSTOSCOPY WITH RETROGRADE PYELOGRAM (Bilateral)   Anesthesia type: General   Pre-op diagnosis: BLADDER CANCER   Location: [facility] ROOM 03 / [facility]   Surgeons: [name], MD       DISCUSSION:71 y.o. smoker with h/o HTN, AAA, Hepatitis C s/p treatment, PE, DVT, COPD Gold B (Moderate reduction in FEV1, 59% ), bladder cancer scheduled for above procedure 05/31/2022 with Dr. Harold Barban.   Pt last seen by pulmonology 04/05/2022. Per pulmonology note 05/26/2022, "Pulmonary medicine does not provide preoperative clearance of other preoperative risk assessment.  Based on the ARISCAT model, patient is low at 1.3% risk of postoperative pulmonary complication assuming duration of surgery is less than or equal to 3 hours.  If duration of surgery is greater than 3 hours, patient is intermediate 13.3% risk of postoperative pulmonary complication.  There are no modifiable risk factors to assess prior to surgery."  Anticipate pt can proceed with planned procedure barring acute status change.   VS: There were no vitals taken for this visit.  PROVIDERS: Wenda Low, MD is PCP    LABS: Labs reviewed: Acceptable for surgery. (all labs ordered are listed, but only abnormal results are displayed)  Labs Reviewed - No data to display   IMAGES:   EKG:   CV:  Past Medical History:  Diagnosis Date   AAA (abdominal aortic aneurysm) without rupture ([place]) 01/01/2021   Cancer ([place])    bladder   Cirrhosis of liver ([place])    DVT (deep venous thrombosis) ([place]) 05/02/2016   subacute LLE DVT 05/02/16   Hepatitis C    HCV, s/p treatment   HLD (hyperlipidemia)    Hypertension    Lung nodule 02/23/2022   left lung   PE (pulmonary thromboembolism) ([place]) 05/02/2016   bilateral at least submassive PE with right heart strain, s/p Xarelto x 4 years    Pre-diabetes 12/2020   no meds    Past Surgical History:  Procedure Laterality Date   BRONCHIAL BIOPSY  03/22/2022   Procedure: BRONCHIAL BIOPSIES;  Surgeon: Garner Nash, DO;  Location: [facility];  Service: Pulmonary;;   BRONCHIAL NEEDLE ASPIRATION BIOPSY  03/22/2022   Procedure: BRONCHIAL NEEDLE ASPIRATION BIOPSIES;  Surgeon: Garner Nash, DO;  Location: [facility];  Service: Pulmonary;;   COLONOSCOPY     TONSILLECTOMY     VIDEO BRONCHOSCOPY WITH ENDOBRONCHIAL ULTRASOUND Bilateral 03/22/2022   Procedure: VIDEO BRONCHOSCOPY WITH ENDOBRONCHIAL ULTRASOUND;  Surgeon: Garner Nash, DO;  Location: [facility];  Service: Pulmonary;  Laterality: Bilateral;    MEDICATIONS: No current facility-administered medications for this encounter.    amLODipine (NORVASC) 5 MG tablet   aspirin EC 81 MG tablet   atorvastatin (LIPITOR) 10 MG tablet   losartan-hydrochlorothiazide (HYZAAR) 100-12.5 MG per tablet   metoprolol succinate (TOPROL-XL) 25 MG 24 hr tablet   Multiple Vitamin (MULTIVITAMIN) tablet   tamsulosin (FLOMAX) 0.4 MG CAPS capsule   varenicline (CHANTIX) 1 MG tablet    [facility] Ward, PA-C [facility] Pre-Surgical Testing [phone]

## 2022-05-29 NOTE — Anesthesia Preprocedure Evaluation (Signed)
Anesthesia Evaluation    Airway        Dental   Pulmonary Current Smoker and Patient abstained from smoking.          Cardiovascular hypertension,      Neuro/Psych    GI/Hepatic   Endo/Other    Renal/GU      Musculoskeletal   Abdominal   Peds  Hematology   Anesthesia Other Findings   Reproductive/Obstetrics                             Anesthesia Physical Anesthesia Plan  ASA:   Anesthesia Plan:    Post-op Pain Management:    Induction:   PONV Risk Score and Plan:   Airway Management Planned:   Additional Equipment:   Intra-op Plan:   Post-operative Plan:   Informed Consent:   Plan Discussed with:   Anesthesia Plan Comments: (See PAT note 05/29/2022)       Anesthesia Quick Evaluation

## 2022-05-31 ENCOUNTER — Ambulatory Visit (HOSPITAL_COMMUNITY): Admission: RE | Admit: 2022-05-31 | Payer: Medicare Other | Source: Ambulatory Visit | Admitting: Urology

## 2022-05-31 ENCOUNTER — Encounter (HOSPITAL_COMMUNITY): Admission: RE | Payer: Self-pay | Source: Ambulatory Visit

## 2022-05-31 SURGERY — TURBT (TRANSURETHRAL RESECTION OF BLADDER TUMOR)
Anesthesia: General

## 2022-06-02 ENCOUNTER — Ambulatory Visit: Payer: Medicare Other | Admitting: Pulmonary Disease

## 2022-06-02 ENCOUNTER — Encounter: Payer: Self-pay | Admitting: Pulmonary Disease

## 2022-06-02 VITALS — BP 142/78 | HR 85 | Temp 98.1°F | Ht 74.0 in | Wt 241.6 lb

## 2022-06-02 DIAGNOSIS — R911 Solitary pulmonary nodule: Secondary | ICD-10-CM | POA: Diagnosis not present

## 2022-06-02 DIAGNOSIS — F17210 Nicotine dependence, cigarettes, uncomplicated: Secondary | ICD-10-CM | POA: Diagnosis not present

## 2022-06-02 NOTE — Patient Instructions (Addendum)
Nice to see you again  CT scan is stable  Will repeat scan March, if things are stable we can space exam to 6 months of every 3 months.  Return to clinic in 2 months or sooner as needed with Dr. Silas Flood, after CT scan is performed

## 2022-06-02 NOTE — Progress Notes (Signed)
$'@Patient'R$  ID: Wyatt Berry, male    DOB: 1950/08/18, 72 y.o.   MRN: [id number]  Chief Complaint  Patient presents with   Follow-up    No c/o    Referring provider: Wenda Low, MD  HPI:   72 y.o. man whom are seen in consultation for follow up of pulmonary nodule discovered on lung cancer screening CT scan.   Overall, doing well.  Denies any dyspnea.  Repeat CT scan last month demonstrates stability of pulmonary nodules.  We again discussed PET avidity of the right middle lobe.  High suspicion for underlying lung cancer.  This was biopsied but did not demonstrate cancer.  After shared decision-making in the past we agreed on continued surveillance.  He continues to desire this strategy after shared decision making.  Discussed repeat the scan in March, 12-monthinterval.  If stable can consider increasing to 633-monthnterval in the future.  He continues to smoke.  We discussed this would likely be a barrier to any chance of resection with thoracic surgery which he understands.  He is working on cutting back.  HPI at initial visit: Patient usual state of health.  After discussion with PCP underwent lung cancer screening.  This was performed 01/2022.  On my review interpretation there is a area of left upper lobe nodularity/scarring, left lower lobe greater than 1 cm rounded solid lesion and additional right middle lobe rounded solid lesion about 1 cm.  This prompted referral.  He gets a little short of breath exertion at times.  Nothing out of the ordinary.  Minimal cough.  Overall feels fine.  Has decreased his cigarette intake since this finding.  Down to 5 cigarettes a day from half a pack a day prior to the CT scan.  We discussed at length the CT findings and concern for possible malignancy.  The 2 distinct nodules to be a bit atypical but possible for synchronous primaries versus 2 distinct pathologies.  Discussed additional imaging via PET scan would be helpful in terms of deciding neck  steps and he agrees to this.  PMH: Tobacco abuse, hypertension Surgical history: Past Surgical History:  Procedure Laterality Date   BRONCHIAL BIOPSY  03/22/2022   Procedure: BRONCHIAL BIOPSIES;  Surgeon: IcGarner NashDO;  [facility];  Service: Pulmonary;;   BRONCHIAL NEEDLE ASPIRATION BIOPSY  03/22/2022   Procedure: BRONCHIAL NEEDLE ASPIRATION BIOPSIES;  Surgeon: IcGarner NashDO;  [facility];  Service: Pulmonary;;   COLONOSCOPY     TONSILLECTOMY     VIDEO BRONCHOSCOPY WITH ENDOBRONCHIAL ULTRASOUND Bilateral 03/22/2022   Procedure: VIDEO BRONCHOSCOPY WITH ENDOBRONCHIAL ULTRASOUND;  Surgeon: IcGarner NashDO;  [facility]: [facility];  Laterality: Bilateral;   Family history: Mother with diabetes, CHF, brother with CAD Social history: Current smoker, down to a few cigarettes a day, historically half a pack per day[place] [facility] Flow[place]:   ACT:      No data to display           MMRC:     No data to display           [place]:      No data to display           Tests:   FENO:  No results found for: "NITRICOXIDE"  PFT:    Latest Ref Rng & Units 04/05/2022   11:32 AM  PFT Results  FVC-Pre L 3.37   FVC-Predicted Pre %  68   FVC-Post L 3.39   FVC-Predicted Post % 68   Pre FEV1/FVC % % 64   Post FEV1/FCV % % 65   FEV1-Pre L 2.16   FEV1-Predicted Pre % 59   FEV1-Post L 2.20   DLCO uncorrected ml/min/mmHg 19.62   DLCO UNC% % 69   DLCO corrected ml/min/mmHg 18.91   DLCO COR %Predicted % 66   DLVA Predicted % 80   TLC L 5.65   TLC % Predicted % 73   RV % Predicted % 77   Personally reviewed and interpreted as moderate fixed obstruction, no bronchodilator response, lung volumes within normal limits, moderately reduced DLCO  WALK:      No data to display           Imaging: Personally reviewed and as per EMR discussion this note US Abdomen Limited RUQ  (LIVER/GB)  Result Date: 05/11/2022 CLINICAL DATA:  Cirrhosis EXAM: ULTRASOUND ABDOMEN LIMITED RIGHT UPPER QUADRANT COMPARISON:  Chest CT 05/06/2022 FINDINGS: Gallbladder: Multiple stones in the gallbladder lumen with associated sludge. No gallbladder wall thickening or pericholecystic fluid. Negative sonographic Murphy's sign. Common bile duct: Diameter: 2 mm Liver: Coarsened echogenicity and nodular contour. No focal lesion. Portal vein is patent on color Doppler imaging with normal direction of blood flow towards the liver. Other: None. IMPRESSION: 1. Cirrhotic liver morphology. No focal lesion identified. 2. Cholelithiasis without secondary signs of acute cholecystitis. Electronically Signed   By: Lovey Newcomer M.D.   On: 05/11/2022 10:34   CT CHEST WO CONTRAST  Result Date: 05/06/2022 CLINICAL DATA:  Pulmonary nodule. EXAM: CT CHEST WITHOUT CONTRAST TECHNIQUE: Multidetector CT imaging of the chest was performed following the standard protocol without IV contrast. RADIATION DOSE REDUCTION: This exam was performed according to the departmental dose-optimization program which includes automated exposure control, adjustment of the mA and/or kV according to patient size and/or use of iterative reconstruction technique. COMPARISON:  03/15/2022 FINDINGS: Cardiovascular: The heart size is normal. No substantial pericardial effusion. Coronary artery calcification is evident. Mild atherosclerotic calcification is noted in the wall of the thoracic aorta. Mediastinum/Nodes: No mediastinal lymphadenopathy. No evidence for gross hilar lymphadenopathy although assessment is limited by the lack of intravenous contrast on the current study. The esophagus has normal imaging features. There is no axillary lymphadenopathy. 12 mm short axis left subpectoral node has decreased in size in the interval measuring 5 mm short axis today on 36/2. Lungs/Pleura: Centrilobular and paraseptal emphysema evident. The irregular  multinodular lesion posterior right middle lobe is similar in the interval measuring 3.2 x 2.7 cm today compared to 3.1 x 2.7 cm previously. Lesion measures 2.9 cm craniocaudal length on coronal imaging today. Nodular component along the antro lateral margin of the lesion appears slightly more confluent on today's exam (compare image 1021/5 today to image 40/4 previously. 1.3 cm smoothly marginated posterior left lower lobe lesion on 97/5 is stable in the interval. Areas of subsegmental atelectasis/scarring are noted in the dependent lungs bilaterally. No new suspicious pulmonary nodule or mass. No focal airspace consolidation. Upper Abdomen: Nodular liver contour suggests cirrhosis. Tiny hypodensity anteriorly in the left liver (138/2) is stable. Musculoskeletal: No worrisome lytic or sclerotic osseous abnormality. IMPRESSION: 1. No substantial interval change in size of the irregular multinodular lesion posterior right middle lobe. Nodular component along the anterolateral margin of the lesion appears slightly more confluent on today's exam. Close attention on follow-up recommended. 2. Stable 1.3 cm smoothly marginated posterior left lower lobe pulmonary nodule. 3. Enlarged left subpectoral  node identified on the previous study has resolved in the interval. 4. Nodular liver contour suggests cirrhosis. 5. Aortic Atherosclerosis (ICD10-I70.0) and Emphysema (ICD10-J43.9). Electronically Signed   By: Misty Stanley M.D.   On: 05/06/2022 09:12    Lab Results: Personally reviewed CBC    Component Value Date/Time   WBC 4.6 05/12/2022 1323   RBC 5.08 05/12/2022 1323   HGB 15.4 05/12/2022 1323   HCT 46.3 05/12/2022 1323   PLT 233 05/12/2022 1323   MCV 91.1 05/12/2022 1323   MCH 30.3 05/12/2022 1323   MCHC 33.3 05/12/2022 1323   RDW 14.2 05/12/2022 1323   LYMPHSABS 2.4 09/18/2020 1008   MONOABS 0.4 09/18/2020 1008   EOSABS 0.0 09/18/2020 1008   BASOSABS 0.0 09/18/2020 1008    BMET    Component Value  Date/Time   NA 137 05/12/2022 1323   K 4.0 05/12/2022 1323   CL 104 05/12/2022 1323   CO2 23 05/12/2022 1323   GLUCOSE 100 (H) 05/12/2022 1323   BUN 12 05/12/2022 1323   CREATININE 1.15 05/12/2022 1323   CALCIUM 9.3 05/12/2022 1323   GFRNONAA >60 05/12/2022 1323   GFRAA >60 05/03/2016 0224    BNP    Component Value Date/Time   BNP 74.6 05/01/2016 2304    ProBNP No results found for: "PROBNP"  Specialty Problems       Pulmonary Problems   Acute respiratory failure with hypoxia (HCC)   Lung nodule    No Known Allergies  Immunization History  Administered Date(s) Administered   Fluad Quad(high Dose 65+) 02/24/2019   Influenza, High Dose Seasonal PF 04/05/2018   PFIZER(Purple Top)SARS-COV-2 Vaccination 06/29/2019, 07/20/2019   Pneumococcal Conjugate-13 06/08/2017   Pneumococcal Polysaccharide-23 02/21/2013   Tdap 02/21/2013   Zoster Recombinat (Shingrix) 06/16/2018, 03/11/2019   Zoster, Live 10/28/2014, 06/16/2018, 03/11/2019    Past Medical History:  Diagnosis Date   AAA (abdominal aortic aneurysm) without rupture ([place]) 01/01/2021   Cancer ([place])    bladder   Cirrhosis of liver (HCC)    DVT (deep venous thrombosis) ([place]) 05/02/2016   subacute LLE DVT 05/02/16   Hepatitis C    HCV, s/p treatment   HLD (hyperlipidemia)    Hypertension    Lung nodule 02/23/2022   left lung   PE (pulmonary thromboembolism) ([place]) 05/02/2016   bilateral at least submassive PE with right heart strain, s/p Xarelto x 4 years   Pre-diabetes 12/2020   no meds    Tobacco History: Social History   Tobacco Use  Smoking Status Every Day   Packs/day: 0.50   Years: 50.00   Total pack years: 25.00   Types: Cigarettes   Start date: 1973  Smokeless Tobacco Never  Tobacco Comments   Pt states that he smokes less than 5 cigarettes a day. 04/05/22 ALS    Ready to quit: Not Answered Counseling given: Not Answered Tobacco comments: Pt states that he smokes less than 5 cigarettes a  day. 04/05/22 ALS      Outpatient Encounter Medications as of 06/02/2022  Medication Sig   amLODipine (NORVASC) 5 MG tablet Take 5 mg by mouth daily.   aspirin EC 81 MG tablet Take 81 mg by mouth daily.   atorvastatin (LIPITOR) 10 MG tablet Take 10 mg by mouth daily.   losartan-hydrochlorothiazide (HYZAAR) 100-12.5 MG per tablet Take 1 tablet by mouth daily.   metoprolol succinate (TOPROL-XL) 25 MG 24 hr tablet Take 25 mg by mouth at bedtime.   Multiple Vitamin (MULTIVITAMIN) tablet Take  1 tablet by mouth daily.   tamsulosin (FLOMAX) 0.4 MG CAPS capsule Take 0.4 mg by mouth daily after supper.   varenicline (CHANTIX) 1 MG tablet Take 1 mg by mouth daily. (Patient not taking: Reported on 06/02/2022)   No facility-administered encounter medications on file as of 06/02/2022.     Review of Systems  Review of Systems  N/a Physical Exam  BP (!) 142/78   Pulse 85   Temp 98.1 F (36.7 C) (Oral)   Ht '6\' 2"'$  (1.88 m)   Wt 241 lb 9.6 oz (109.6 kg)   SpO2 99%   BMI 31.02 kg/m   Wt Readings from Last 5 Encounters:  06/02/22 241 lb 9.6 oz (109.6 kg)  05/12/22 236 lb (107 kg)  04/05/22 234 lb (106.1 kg)  04/01/22 236 lb 6.4 oz (107.2 kg)  03/22/22 235 lb (106.6 kg)    BMI Readings from Last 5 Encounters:  06/02/22 31.02 kg/m  05/12/22 30.30 kg/m  04/05/22 30.87 kg/m  04/01/22 31.19 kg/m  03/22/22 31.00 kg/m     Physical Exam General: Sitting in chair, no acute distress Eyes: EOMI, no icterus Neck: Supple, no JVP Pulmonary: Clear, normal work of breathing Cardiovascular: Warm, no edema Abdomen: Nondistended, sounds present MSK: No synovitis, no joint effusion Neuro: Normal gait, no weakness Psych: Normal mood, full affect   Assessment & Plan:   Lung nodules: Rounded left lower lobe, right lower lobe as well as left upper lobe fibrotic appearing lesion. LLL not PET AVID, RML is PET avid. S/p robotic navigational bronchoscopy with out evidence of malignancy.  Given  PET avidity and size, high suspicion for malignancy overall.  Discussed referral to thoracic surgery versus radiation oncology for empiric radiation to the area.  At this time, he wishes for ongoing surveillance imaging.  Another scan ordered for March 2024, if stable consider increasing interval to 6 months.  If growing and we will make referral at that time versus discussion of repeat biopsy.  Notably he was encouraged to quit smoking prior to possible thoracic surgery evaluation.  COPD: Gold B.  Relatively minimal symptoms.  Moderate reduction in FEV1, 59%.  If thoracic surgery evaluation is needed, may benefit for additional testing prior to surgery if indicated.  Tobacco abuse: Smoking assessment and cessation counseling Patient currently smoking: I have advised the patient to quit/stop smoking as soon as possible due to high risk for multiple medical problems.  It will also be very difficult for [place] to manage patient's  respiratory symptoms and status if we continue to expose her lungs to a known irritant.  We do not advise e-cigarettes as a form of stopping smoking. Patient is  willing to quit smoking. I have advised the patient that we can assist and have options of nicotine replacement therapy, provided smoking cessation education today, provided smoking cessation counseling, and provided cessation resources.  Stressed that he would need to stop smoking prior to surgical intervention if needed for possible lung cancer.  Chantix prescribed today.  Follow-up next office visit office visit for assessment of smoking cessation.  I spent 3 minutes in tobacco/smoking cessation counseling.   Return in about 2 months (around 08/01/2022).   Lanier Clam, MD 06/02/2022

## 2022-06-07 NOTE — Patient Instructions (Addendum)
SURGICAL WAITING ROOM VISITATION  Patients having surgery or a procedure may have no more than 2 support people in the waiting area - these visitors may rotate.    Children under the age of 60 must have an adult with them who is not the patient.  Due to an increase in RSV and influenza rates and associated hospitalizations, children ages 62 and under may not visit patients in [place].  If the patient needs to stay at the hospital during part of their recovery, the visitor guidelines for inpatient rooms apply. Pre-op nurse will coordinate an appropriate time for 1 support person to accompany patient in pre-op.  This support person may not rotate.    Please refer to the [facility] website for the visitor guidelines for Inpatients (after your surgery is over and you are in a regular room).    Your procedure is scheduled on: 06/14/22   Report to [facility] Main Entrance    Report to admitting at 9:45 AM   Call this number if you have problems the morning of surgery [phone]   Do not eat food or drink liquids :After Midnight.          If you have questions, please contact your surgeon's office.   FOLLOW BOWEL PREP AND ANY ADDITIONAL PRE OP INSTRUCTIONS YOU RECEIVED FROM YOUR SURGEON'S OFFICE!!!     Oral Hygiene is also important to reduce your risk of infection.                                    Remember - BRUSH YOUR TEETH THE MORNING OF SURGERY WITH YOUR REGULAR TOOTHPASTE  DENTURES WILL BE REMOVED PRIOR TO SURGERY PLEASE DO NOT APPLY "Poly grip" OR ADHESIVES!!!   Do NOT smoke after Midnight   Take these medicines the morning of surgery with A SIP OF WATER: Amlodipine, Atorvastatin                You may not have any metal on your body including hair pins, jewelry, and body piercing             Do not wear lotions, powders, cologne, or deodorant  Do not shave  48 hours prior to surgery.               Men may shave face and neck.   Do not bring  valuables to the hospital. [name].   Contacts, glasses, dentures or bridgework may not be worn into surgery.  DO NOT [place]. PHARMACY WILL DISPENSE MEDICATIONS LISTED ON YOUR MEDICATION LIST TO YOU DURING YOUR ADMISSION [place]!    Patients discharged on the day of surgery will not be allowed to drive home.  Someone NEEDS to stay with you for the first 24 hours after anesthesia.   Special Instructions: Bring a copy of your healthcare power of attorney and living will documents the day of surgery if you haven't scanned them before.              Please read over the following fact sheets you were given: IF [place] [name]    If you received a COVID test during your pre-op visit  it is requested that you wear  a mask when out in public, stay away from anyone that may not be feeling well and notify your surgeon if you develop symptoms. If you test positive for Covid or have been in contact with anyone that has tested positive in the last 10 days please notify you surgeon.    [place] - Preparing for Surgery Before surgery, you can play an important role.  Because skin is not sterile, your skin needs to be as free of germs as possible.  You can reduce the number of germs on your skin by washing with CHG (chlorahexidine gluconate) soap before surgery.  CHG is an antiseptic cleaner which kills germs and bonds with the skin to continue killing germs even after washing. Please DO NOT use if you have an allergy to CHG or antibacterial soaps.  If your skin becomes reddened/irritated stop using the CHG and inform your nurse when you arrive at Short Stay. Do not shave (including legs and underarms) for at least 48 hours prior to the first CHG shower.  You may shave your face/neck.  Please follow these instructions carefully:  1.  Shower with  CHG Soap the night before surgery and the  morning of surgery.  2.  If you choose to wash your hair, wash your hair first as usual with your normal  shampoo.  3.  After you shampoo, rinse your hair and body thoroughly to remove the shampoo.                             4.  Use CHG as you would any other liquid soap.  You can apply chg directly to the skin and wash.  Gently with a scrungie or clean washcloth.  5.  Apply the CHG Soap to your body ONLY FROM THE NECK DOWN.   Do   not use on face/ open                           Wound or open sores. Avoid contact with eyes, ears mouth and   genitals (private parts).                       Wash face,  Genitals (private parts) with your normal soap.             6.  Wash thoroughly, paying special attention to the area where your    surgery  will be performed.  7.  Thoroughly rinse your body with warm water from the neck down.  8.  DO NOT shower/wash with your normal soap after using and rinsing off the CHG Soap.                9.  Pat yourself dry with a clean towel.            10.  Wear clean pajamas.            11.  Place clean sheets on your bed the night of your first shower and do not  sleep with pets. Day of Surgery : Do not apply any lotions/deodorants the morning of surgery.  Please wear clean clothes to the hospital/surgery center.  FAILURE TO FOLLOW THESE INSTRUCTIONS MAY RESULT IN THE CANCELLATION OF YOUR SURGERY  PATIENT SIGNATURE_________________________________  NURSE SIGNATURE__________________________________  ________________________________________________________________________

## 2022-06-07 NOTE — Progress Notes (Addendum)
COVID Vaccine Completed: yes  Date of COVID positive in last 90 days: no  PCP - Wenda Low, MD Cardiologist - [facility] [place], [place]  Chest x-ray - 03/22/22 Epic EKG - 03/22/22 Epic Stress Test - n/a ECHO - 05/02/16 Epic Cardiac Cath - n/a Pacemaker/ICD device last checked: n/a Spinal Cord Stimulator: n/a  Bowel Prep - no  Sleep Study - n/a CPAP -   Fasting Blood Sugar - pre DM Checks Blood Sugar no checks at home   Last dose of GLP1 agonist-  N/A GLP1 instructions:  N/A   Last dose of SGLT-2 inhibitors-  N/A SGLT-2 instructions: N/A   Blood Thinner Instructions: Aspirin Instructions: ASA 81, hold 5 days Last Dose:  Activity level: Can go up a flight of stairs and perform activities of daily living without stopping and without symptoms of chest pain or shortness of breath.    Anesthesia review: HTN, hep c, PE, hypoxia, Lung nodules, AAA  Patient denies shortness of breath, fever, cough and chest pain at PAT appointment  Patient verbalized understanding of instructions that were given to them at the PAT appointment. Patient was also instructed that they will need to review over the PAT instructions again at home before surgery.

## 2022-06-08 ENCOUNTER — Encounter (HOSPITAL_COMMUNITY)
Admission: RE | Admit: 2022-06-08 | Discharge: 2022-06-08 | Disposition: A | Discharge status: Home / Self Care | Payer: Medicare Other | Source: Ambulatory Visit | Attending: Urology | Admitting: Urology

## 2022-06-08 ENCOUNTER — Encounter (HOSPITAL_COMMUNITY): Payer: Self-pay

## 2022-06-08 VITALS — BP 146/94 | HR 89 | Temp 98.2°F | Resp 12 | Ht 73.0 in | Wt 237.0 lb

## 2022-06-08 DIAGNOSIS — Z01812 Encounter for preprocedural laboratory examination: Secondary | ICD-10-CM | POA: Insufficient documentation

## 2022-06-08 DIAGNOSIS — I10 Essential (primary) hypertension: Secondary | ICD-10-CM | POA: Diagnosis not present

## 2022-06-08 DIAGNOSIS — R7303 Prediabetes: Secondary | ICD-10-CM | POA: Diagnosis not present

## 2022-06-08 LAB — BASIC METABOLIC PANEL
Anion gap: 9 (ref 5–15)
BUN: 14 mg/dL (ref 8–23)
CO2: 24 mmol/L (ref 22–32)
Calcium: 9.2 mg/dL (ref 8.9–10.3)
Chloride: 105 mmol/L (ref 98–111)
Creatinine, Ser: 1.16 mg/dL (ref 0.61–1.24)
GFR, Estimated: >60 mL/min (ref >60)
Glucose, Bld: 110 mg/dL — ABNORMAL HIGH (ref 70–99)
Potassium: 3.8 mmol/L (ref 3.5–5.1)
Sodium: 138 mmol/L (ref 135–145)

## 2022-06-08 LAB — CBC
HCT: 45.7 % (ref 39.0–52.0)
Hemoglobin: 15 g/dL (ref 13.0–17.0)
MCH: 29.9 pg (ref 26.0–34.0)
MCHC: 32.8 g/dL (ref 30.0–36.0)
MCV: 91 fL (ref 80.0–100.0)
Platelets: 243 10*3/uL (ref 150–400)
RBC: 5.02 MIL/uL (ref 4.22–5.81)
RDW: 14.5 % (ref 11.5–15.5)
WBC: 5.1 10*3/uL (ref 4.0–10.5)
nRBC: 0 % (ref 0.0–0.2)

## 2022-06-08 LAB — GLUCOSE, CAPILLARY: Glucose-Capillary: 112 mg/dL — ABNORMAL HIGH (ref 70–99)

## 2022-06-10 DIAGNOSIS — C678 Malignant neoplasm of overlapping sites of bladder: Secondary | ICD-10-CM | POA: Diagnosis not present

## 2022-06-10 DIAGNOSIS — R3121 Asymptomatic microscopic hematuria: Secondary | ICD-10-CM | POA: Diagnosis not present

## 2022-06-13 NOTE — Anesthesia Preprocedure Evaluation (Addendum)
Anesthesia Evaluation  Patient identified by MRN, date of birth, ID band Patient awake    Reviewed: Allergy & Precautions, NPO status , Patient's Chart, lab work & pertinent test results, reviewed documented beta blocker date and time   History of Anesthesia Complications Negative for: history of anesthetic complications  Airway Mallampati: II  TM Distance: >3 FB Neck ROM: Full    Dental no notable dental hx. (+) Dental Advisory Given   Pulmonary Current Smoker and Patient abstained from smoking., PE   Pulmonary exam normal        Cardiovascular hypertension, Pt. on medications and Pt. on home beta blockers Normal cardiovascular exam     Neuro/Psych negative neurological ROS     GI/Hepatic negative GI ROS,,,(+) Hepatitis -, C  Endo/Other  negative endocrine ROS    Renal/GU negative Renal ROS     Musculoskeletal   Abdominal   Peds  Hematology negative hematology ROS (+)   Anesthesia Other Findings   Reproductive/Obstetrics                             Anesthesia Physical Anesthesia Plan  ASA: 3  Anesthesia Plan: General   Post-op Pain Management: Tylenol PO (pre-op)* and Minimal or no pain anticipated   Induction: Intravenous  PONV Risk Score and Plan: 1 and Dexamethasone, Ondansetron and Treatment may vary due to age or medical condition  Airway Management Planned: LMA  Additional Equipment:   Intra-op Plan:   Post-operative Plan: Extubation in OR  Informed Consent: I have reviewed the patients History and Physical, chart, labs and discussed the procedure including the risks, benefits and alternatives for the proposed anesthesia with the patient or authorized representative who has indicated his/her understanding and acceptance.     Dental advisory given  Plan Discussed with: Anesthesiologist and CRNA  Anesthesia Plan Comments: ( )        Anesthesia Quick  Evaluation

## 2022-06-13 NOTE — H&P (Signed)
have symptoms of an enlarged prostate.  HPI: Wyatt Berry is a 72 year-old male established patient who is here for symptoms of enlarged prostate.   His symptoms have not gotten worse over the last year. He has been treated with Flomax. The patient has never had a surgical procedure for bladder outlet obstruction to his prostate.   He does not get up at night to urinate. He does not have to wait a long time to start his urinary stream. He does not have to strain or bear down to start his urinary stream. He does have a good size and strength to his urinary stream. He does dribble at the end of urination. He is not having problems with emptying his bladder well.   His urine has not shut off completely. He has not previously had an indwelling catheter in for more than two weeks at a time.   He has had a PSA done. He does not have any family members who have been diagnosed with prostate cancer.   2019 PSA 1.42, increased from 0.96 last year. Denies unexplained weight loss, unexplained bone pain, or unexplained fatigue.    He remains on tamsulosin and typically has no bothersome issues with stream or nocturia. He notes that he drinks fluids later in the day or alcohol weekend, urgency and nocturia are somewhat increased. Denies any painful urination or burning with urination. He denies gross hematuria. Denies any interval treatment for UTI.   He continues to use Viagra for erectile dysfunction prescribed by another provider but with good effect.   Patient diagnosed with a DVT and subsequently pulmonary embolism last year and now on Xarelto.   11/14/2017: He has stable LUTS on flomax 0.'4mg'$  daily   11/29/2018: He is on flomax 0.'4mg'$  daily. He has stable mild LUTS    CC: I have an elevated PSA (Normalized).  HPI: His most recent PSA is 1.2. He has not had any treatment for his elevated PSA which has normalized. He has not had a prostate biopsy done.   He has not had a prostate infection. He has not  had a urinary tract infection. He is not having problems with emptying his bladder well.   11/14/2017: Patient had an increase in PSA from 0.96 to 1.4. on recheck it lowered to 1.2 over a 3 months period. no worsening LUTS.   11/29/2018: no recent PSA. NO new LUTS    CC: I am having trouble with my erections.  HPI: He first stated noticing pain on approximately 10/21/2008. His symptoms did begin gradually. His symptoms have been stable over the last year.   He does have difficulties achieving an erection. He does have problems maintaining his erections. His erections are straight. He has tried Viagra. It did work.   He does not have premature ejaculation. He does not have trouble reaching climax. He does not have anxiety because of the symptoms.   11/14/2017: He has been using viagra '100mg'$  with good results.   11/29/2018: He takes sildenafil '100mg'$  with good results   -03/16/20-patient with history of erectile dysfunction and BPH as above. Has been on tamsulosin 0.4 mg daily with mild stable bladder outlet symptoms. From ED standpoint takes sildenafil 100 mg daily with fairly good results. Here for follow-up prostate check and medication renewal. PSA was good at 0.93 on 03/10/2020   -03/17/21-patient with history of erectile dysfunction and BPH as above. BPH managed with tamsulosin 0.4 mg daily with mild stable bladder outlet symptoms. From ED standpoint  continues with sildenafil as needed with fairly good results. His most recent PSA is 1.28 on 03/10/2021 which is relatively stable. Here for follow-up prostate check   -03/18/22-patient with history erectile dysfunction and BPH as above. BPH currently managed with tamsulosin 0.4 mg daily and has stable mild bladder outlet symptoms. Patient does utilize sildenafil as needed for erectile dysfunction. His most recent PSA 0.78 on 03/11/2022. Here for prostate check.  Micro urinalysis shows 10-20 RBCs rare bacteria patient is a long-term smoker half pack to  1 pack/day for some 30 years. Had recent PET scan showing normal upper urinary tract on 03/09/2022. Did discover a lung nodule is scheduled for biopsy in the next week or so.  Postvoid residual is 186 cc  -12/78/23-patient with recent discovery of microhematuria as above. Has had PET scan showing normal upper urinary tract back on 03/09/2022. He is here for cystoscopy to assess bladder. The patient in the interim had lung biopsy which was negative for malignancy but there is still high suspicion that this is cancer and he is opted for some continued CT surveillance which has been scheduled with pulmonary medicine.  Micro urinalysis is clear and urine spun sediment  Cystoscopy is performed today shows moderate bilobar prostatic hypertrophy with approximate 2-1/2 cm prostate urethral length. Bladder shows small 1 cm exophytic tumor in the floor the bladder there is also several satellite papillary tumors along the right lateral bladder wall. Ureteral orifice ease were clear from tumor involvement and effluxing clear urine.     ALLERGIES: No Allergies    MEDICATIONS: Aspirin Ec 81 mg tablet, delayed release 1 tablet PO Daily  Amlodipine Besylate 2.5 mg tablet  Losartan-Hydrochlorothiazide 100 mg-12.5 mg tablet     GU PSH: No GU PSH    NON-GU PSH: No Non-GU PSH    GU PMH: BPH w/LUTS - 03/18/2022, - 03/17/2021, - 2021, - 2020, - 2019, - 2018, Benign localized prostatic hyperplasia with lower urinary tract symptoms (LUTS), - 2017 Microscopic hematuria - 03/18/2022 Nocturia - 03/18/2022, - 03/17/2021, - 2021, Nocturia, - 2017 ED due to arterial insufficiency - 03/17/2021, - 2021, - 2020, - 2019, Erectile dysfunction due to arterial insufficiency, - 2017 Elevated PSA - 2020, - 2019 Encounter for Prostate Cancer screening, Prostate cancer screening - 2017    NON-GU PMH: Encounter for general adult medical examination without abnormal findings, Encounter for preventive health examination -  2017 Personal history of other infectious and parasitic diseases, History of hepatitis C - 2017 Vitamin D deficiency, unspecified, Vitamin D deficiency - 2014    FAMILY HISTORY: Congestive Heart Failure - Mother Death In The Family Mother - Mother Diabetes - Mother Family Health Status - Father alive at age 89 - Father renal failure - Mother   SOCIAL HISTORY: Marital Status: Married Preferred Language: English; Ethnicity: Not Hispanic Or Latino; Race: Black or African American     Notes: Current every day smoker, Tobacco Use, Alcohol Use, Marital History - Currently Married, Daily Coffee Consumption (___ Cups/Day), Occupation:   REVIEW OF SYSTEMS:    GU Review Male:   Patient denies frequent urination, hard to postpone urination, burning/ pain with urination, get up at night to urinate, leakage of urine, stream starts and stops, trouble starting your stream, have to strain to urinate , erection problems, and penile pain.  Gastrointestinal (Upper):   Patient denies nausea, vomiting, and indigestion/ heartburn.  Gastrointestinal (Lower):   Patient denies diarrhea and constipation.  Constitutional:   Patient denies fever, night sweats, weight  loss, and fatigue.  Skin:   Patient denies skin rash/ lesion and itching.  Eyes:   Patient denies blurred vision and double vision.  Ears/ Nose/ Throat:   Patient denies sore throat and sinus problems.  Hematologic/Lymphatic:   Patient denies swollen glands and easy bruising.  Cardiovascular:   Patient denies leg swelling and chest pains.  Respiratory:   Patient denies cough and shortness of breath.  Endocrine:   Patient denies excessive thirst.  Musculoskeletal:   Patient denies back pain and joint pain.  Neurological:   Patient denies dizziness and headaches.  Psychologic:   Patient denies depression and anxiety.   VITAL SIGNS: None   GU PHYSICAL EXAMINATION:    Urethral Meatus: Normal size. No lesion, no wart, no discharge, no polyp. Normal  location.  Penis: Circumcised, no warts, no cracks. No dorsal Peyronie's plaques, no left corporal Peyronie's plaques, no right corporal Peyronie's plaques, no scarring, no warts. No balanitis, no meatal stenosis.   MULTI-SYSTEM PHYSICAL EXAMINATION:    Constitutional: Well-nourished. No physical deformities. Normally developed. Good grooming.  Neck: Neck symmetrical, not swollen. Normal tracheal position.  Respiratory: No labored breathing, no use of accessory muscles.   Cardiovascular: Normal temperature, normal extremity pulses, no swelling, no varicosities.  Lymphatic: No enlargement of neck, axillae, groin.  Skin: No paleness, no jaundice, no cyanosis. No lesion, no ulcer, no rash.  Neurologic / Psychiatric: Oriented to time, oriented to place, oriented to person. No depression, no anxiety, no agitation.  Eyes: Normal conjunctivae. Normal eyelids.  Ears, Nose, Mouth, and Throat: Left ear no scars, no lesions, no masses. Right ear no scars, no lesions, no masses. Nose no scars, no lesions, no masses. Normal hearing. Normal lips.  Musculoskeletal: Normal gait and station of head and neck.     Complexity of Data:   03/11/22 03/10/21 03/10/20 11/29/18 11/07/17 08/01/17 08/09/16 07/14/15  PSA  Total PSA 0.78 ng/mL 1.28 ng/mL 0.93 ng/mL 0.84 ng/mL 1.20 ng/mL 1.42 ng/mL 0.96 ng/dl 0.90     07/08/08  Hormones  Testosterone, Total 901.0     PROCEDURES:         Flexible Cystoscopy - 52000  Risks, benefits, and some of the potential complications of the procedure were discussed at length with the patient including infection, bleeding, voiding discomfort, urinary retention, fever, chills, sepsis, and others. All questions were answered. Informed consent was obtained. Antibiotic prophylaxis was given. Sterile technique and intraurethral analgesia were used.  Meatus:  Normal size. Normal location. Normal condition.  Urethra:  No strictures.  External Sphincter:  Normal.  Verumontanum:  Normal.   Prostate:  Non-obstructing. No hyperplasia.  Bladder Neck:  Non-obstructing.  Ureteral Orifices:  Normal location. Normal size. Normal shape. Effluxed clear urine.  Bladder:  Cystoscopy is performed today shows moderate bilobar prostatic hypertrophy with approximate 2-1/2 cm prostate urethral length. Bladder shows small 1 cm exophytic tumor in the floor the bladder there is also several satellite papillary tumors along the right lateral bladder wall. Ureteral orifice ease were clear from tumor involvement and effluxing clear urine..      The lower urinary tract was carefully examined. The procedure was well-tolerated and without complications. Antibiotic instructions were given. Instructions were given to call the office immediately for bloody urine, difficulty urinating, urinary retention, painful or frequent urination, fever, chills, nausea, vomiting or other illness. The patient stated that he understood these instructions and would comply with them.         Urinalysis Dipstick Dipstick Cont'd  Color: Yellow  Bilirubin: Neg mg/dL  Appearance: Clear Ketones: Neg mg/dL  Specific Gravity: 1.020 Blood: Neg ery/uL  pH: <=5.0 Protein: Neg mg/dL  Glucose: Neg mg/dL Urobilinogen: 0.2 mg/dL    Nitrites: Neg    Leukocyte Esterase: Neg leu/uL    ASSESSMENT:      ICD-10 Details  1 GU:   Bladder Cancer overlapping sites - V70.3 Acute, Complicated Injury  2   Microscopic hematuria - E03.52 Acute, Complicated Injury   PLAN:           Document Letter(s):  Created for Patient: Clinical Summary         Notes:   Discussed cystoscopic findings with the patient and showed it to him on the video monitor today. Recommended cystoscopy bilateral retrogrades and TURBT. Will schedule accordingly in the near future. Risk and benefits discussed as outlined below.  TURBT consent: I have discussed with the patient the risks, benefits of TURBT which include but are not limited to: Bleeding, infection, damage to  the bladder with potential perforation of the bladder, damage to surrounding organs, possible need for further procedures including open repair and catheterization, possibility of nonhealing area within the bladder, urgency, frequency which may be refractory to medications. I pointed out that in some occasions after resection of the bladder tumor, mitomycin-C chemotherapy may be instilled into the bladder. The risks associated with this therapy include but are not limited to: Refractory or new onset urgency, frequency, dysuria, infrequently severe systemic side effects secondary to mitomycin-C. After full discussion of the risks, benefits and alternatives, the patient has consented to the above procedure and desires to proceed.

## 2022-06-14 ENCOUNTER — Ambulatory Visit (HOSPITAL_COMMUNITY): Payer: Medicare Other | Admitting: Physician Assistant

## 2022-06-14 ENCOUNTER — Ambulatory Visit (HOSPITAL_COMMUNITY)
Admission: RE | Admit: 2022-06-14 | Discharge: 2022-06-14 | Disposition: A | Discharge status: Home / Self Care | Payer: Medicare Other | Attending: Urology | Admitting: Urology

## 2022-06-14 ENCOUNTER — Encounter (HOSPITAL_COMMUNITY)
Admission: RE | Disposition: A | Discharge status: Home / Self Care | Payer: Self-pay | Source: Home / Self Care | Attending: Urology

## 2022-06-14 ENCOUNTER — Ambulatory Visit (HOSPITAL_COMMUNITY): Payer: Medicare Other

## 2022-06-14 ENCOUNTER — Encounter (HOSPITAL_COMMUNITY): Payer: Self-pay | Admitting: Urology

## 2022-06-14 ENCOUNTER — Ambulatory Visit (HOSPITAL_BASED_OUTPATIENT_CLINIC_OR_DEPARTMENT_OTHER): Payer: Medicare Other | Admitting: Anesthesiology

## 2022-06-14 DIAGNOSIS — I2699 Other pulmonary embolism without acute cor pulmonale: Secondary | ICD-10-CM | POA: Diagnosis not present

## 2022-06-14 DIAGNOSIS — Z86711 Personal history of pulmonary embolism: Secondary | ICD-10-CM | POA: Insufficient documentation

## 2022-06-14 DIAGNOSIS — I10 Essential (primary) hypertension: Secondary | ICD-10-CM | POA: Diagnosis not present

## 2022-06-14 DIAGNOSIS — N529 Male erectile dysfunction, unspecified: Secondary | ICD-10-CM | POA: Diagnosis not present

## 2022-06-14 DIAGNOSIS — Z86718 Personal history of other venous thrombosis and embolism: Secondary | ICD-10-CM | POA: Insufficient documentation

## 2022-06-14 DIAGNOSIS — C679 Malignant neoplasm of bladder, unspecified: Secondary | ICD-10-CM | POA: Diagnosis not present

## 2022-06-14 DIAGNOSIS — F17210 Nicotine dependence, cigarettes, uncomplicated: Secondary | ICD-10-CM | POA: Diagnosis not present

## 2022-06-14 DIAGNOSIS — R7303 Prediabetes: Secondary | ICD-10-CM

## 2022-06-14 DIAGNOSIS — F17200 Nicotine dependence, unspecified, uncomplicated: Secondary | ICD-10-CM | POA: Insufficient documentation

## 2022-06-14 DIAGNOSIS — D090 Carcinoma in situ of bladder: Secondary | ICD-10-CM | POA: Diagnosis not present

## 2022-06-14 DIAGNOSIS — C678 Malignant neoplasm of overlapping sites of bladder: Secondary | ICD-10-CM | POA: Diagnosis not present

## 2022-06-14 DIAGNOSIS — Z7901 Long term (current) use of anticoagulants: Secondary | ICD-10-CM | POA: Diagnosis not present

## 2022-06-14 HISTORY — PX: CYSTOSCOPY W/ RETROGRADES: SHX1426

## 2022-06-14 HISTORY — PX: TRANSURETHRAL RESECTION OF BLADDER TUMOR: SHX2575

## 2022-06-14 SURGERY — TURBT (TRANSURETHRAL RESECTION OF BLADDER TUMOR)
Anesthesia: General

## 2022-06-14 MED ORDER — ONDANSETRON HCL 4 MG/2ML IJ SOLN
INTRAMUSCULAR | Status: DC | PRN
  Administered 2022-06-14: 4 mg via INTRAVENOUS

## 2022-06-14 MED ORDER — ORAL CARE MOUTH RINSE: 15.0000 mL | Freq: Once | OROMUCOSAL | Status: AC

## 2022-06-14 MED ORDER — FENTANYL CITRATE (PF) 100 MCG/2ML IJ SOLN
INTRAMUSCULAR | Status: AC
  Filled 2022-06-14: qty 2

## 2022-06-14 MED ORDER — LIDOCAINE 2% (20 MG/ML) 5 ML SYRINGE
INTRAMUSCULAR | Status: DC | PRN
  Administered 2022-06-14: 100 mg via INTRAVENOUS

## 2022-06-14 MED ORDER — CEFAZOLIN SODIUM-DEXTROSE 2-4 GM/100ML-% IV SOLN
INTRAVENOUS | Status: AC
  Filled 2022-06-14: qty 100

## 2022-06-14 MED ORDER — PHENYLEPHRINE 80 MCG/ML (10ML) SYRINGE FOR IV PUSH (FOR BLOOD PRESSURE SUPPORT)
PREFILLED_SYRINGE | INTRAVENOUS | Status: DC | PRN
  Administered 2022-06-14 (×3): 80 ug via INTRAVENOUS
  Administered 2022-06-14: 160 ug via INTRAVENOUS
  Administered 2022-06-14: 80 ug via INTRAVENOUS

## 2022-06-14 MED ORDER — IOHEXOL 300 MG/ML  SOLN
INTRAMUSCULAR | Status: DC | PRN
  Administered 2022-06-14: 12 mL

## 2022-06-14 MED ORDER — PROPOFOL 10 MG/ML IV BOLUS
INTRAVENOUS | Status: AC
  Filled 2022-06-14: qty 20

## 2022-06-14 MED ORDER — TRAMADOL HCL 50 MG PO TABS: 50.0000 mg | ORAL_TABLET | Freq: Four times a day (QID) | ORAL | 0 refills | Status: DC | PRN

## 2022-06-14 MED ORDER — CHLORHEXIDINE GLUCONATE 0.12 % MT SOLN
15.0000 mL | Freq: Once | OROMUCOSAL | Status: AC
  Administered 2022-06-14: 15 mL via OROMUCOSAL

## 2022-06-14 MED ORDER — LIDOCAINE HCL (PF) 2 % IJ SOLN
INTRAMUSCULAR | Status: AC
  Filled 2022-06-14: qty 5

## 2022-06-14 MED ORDER — FENTANYL CITRATE (PF) 100 MCG/2ML IJ SOLN
INTRAMUSCULAR | Status: DC | PRN
  Administered 2022-06-14: 100 ug via INTRAVENOUS
  Administered 2022-06-14: 50 ug via INTRAVENOUS

## 2022-06-14 MED ORDER — DEXAMETHASONE SODIUM PHOSPHATE 10 MG/ML IJ SOLN
INTRAMUSCULAR | Status: DC | PRN
  Administered 2022-06-14: 4 mg via INTRAVENOUS

## 2022-06-14 MED ORDER — STERILE WATER FOR IRRIGATION IR SOLN
Status: DC | PRN
  Administered 2022-06-14: 3000 mL

## 2022-06-14 MED ORDER — DEXAMETHASONE SODIUM PHOSPHATE 10 MG/ML IJ SOLN
INTRAMUSCULAR | Status: AC
  Filled 2022-06-14: qty 1

## 2022-06-14 MED ORDER — FENTANYL CITRATE PF 50 MCG/ML IJ SOSY: 25.0000 ug | PREFILLED_SYRINGE | INTRAMUSCULAR | Status: DC | PRN

## 2022-06-14 MED ORDER — AMISULPRIDE (ANTIEMETIC) 5 MG/2ML IV SOLN: 10.0000 mg | Freq: Once | INTRAVENOUS | Status: DC | PRN

## 2022-06-14 MED ORDER — LACTATED RINGERS IV SOLN: INTRAVENOUS | Status: DC

## 2022-06-14 MED ORDER — PROPOFOL 10 MG/ML IV BOLUS
INTRAVENOUS | Status: DC | PRN
  Administered 2022-06-14: 50 mg via INTRAVENOUS
  Administered 2022-06-14: 180 mg via INTRAVENOUS
  Administered 2022-06-14: 20 mg via INTRAVENOUS

## 2022-06-14 MED ORDER — ONDANSETRON HCL 4 MG/2ML IJ SOLN
INTRAMUSCULAR | Status: AC
  Filled 2022-06-14: qty 2

## 2022-06-14 MED ORDER — ACETAMINOPHEN 500 MG PO TABS
1000.0000 mg | ORAL_TABLET | Freq: Once | ORAL | Status: AC
  Administered 2022-06-14: 1000 mg via ORAL
  Filled 2022-06-14: qty 2

## 2022-06-14 MED ORDER — PROMETHAZINE HCL 25 MG/ML IJ SOLN: 6.2500 mg | INTRAMUSCULAR | Status: DC | PRN

## 2022-06-14 MED ORDER — CEFAZOLIN SODIUM-DEXTROSE 2-4 GM/100ML-% IV SOLN
2.0000 g | Freq: Once | INTRAVENOUS | Status: AC
  Administered 2022-06-14: 2 g via INTRAVENOUS

## 2022-06-14 MED ORDER — SODIUM CHLORIDE 0.9 % IR SOLN
Status: DC | PRN
  Administered 2022-06-14: 6000 mL

## 2022-06-14 SURGICAL SUPPLY — 24 items
BAG COUNTER SPONGE SURGICOUNT (BAG) IMPLANT
BAG DRN RND TRDRP ANRFLXCHMBR (UROLOGICAL SUPPLIES)
BAG SPNG CNTER NS LX DISP (BAG)
BAG URINE DRAIN 2000ML AR STRL (UROLOGICAL SUPPLIES) IMPLANT
BAG URO CATCHER STRL LF (MISCELLANEOUS) ×2 IMPLANT
CATH URETL OPEN END 6FR 70 (CATHETERS) IMPLANT
CLOTH BEACON ORANGE TIMEOUT ST (SAFETY) ×2 IMPLANT
DRAPE FOOT SWITCH (DRAPES) ×2 IMPLANT
ELECT REM PT RETURN 15FT ADLT (MISCELLANEOUS) IMPLANT
EVACUATOR MICROVAS BLADDER (UROLOGICAL SUPPLIES) IMPLANT
GLOVE SURG LX STRL 7.5 STRW (GLOVE) ×2 IMPLANT
GOWN STRL REUS W/ TWL XL LVL3 (GOWN DISPOSABLE) ×2 IMPLANT
GOWN STRL REUS W/TWL XL LVL3 (GOWN DISPOSABLE) ×2
GUIDEWIRE STR DUAL SENSOR (WIRE) ×2 IMPLANT
HOLDER FOLEY CATH W/STRAP (MISCELLANEOUS) IMPLANT
KIT TURNOVER KIT A (KITS) IMPLANT
LOOP CUT BIPOLAR 24F LRG (ELECTROSURGICAL) IMPLANT
MANIFOLD NEPTUNE II (INSTRUMENTS) ×2 IMPLANT
PACK CYSTO (CUSTOM PROCEDURE TRAY) ×2 IMPLANT
SYR 30ML LL (SYRINGE) IMPLANT
SYR TOOMEY IRRIG 70ML (MISCELLANEOUS)
SYRINGE TOOMEY IRRIG 70ML (MISCELLANEOUS) IMPLANT
TUBING CONNECTING 10 (TUBING) ×2 IMPLANT
TUBING UROLOGY SET (TUBING) ×2 IMPLANT

## 2022-06-14 NOTE — Interval H&P Note (Signed)
History and Physical Interval Note:  06/14/2022 9:54 AM  Wyatt Berry  has presented today for surgery, with the diagnosis of BLADDER CANCER.  The various methods of treatment have been discussed with the patient and family. After consideration of risks, benefits and other options for treatment, the patient has consented to  Procedure(s) with comments: TRANSURETHRAL RESECTION OF BLADDER TUMOR (TURBT) (N/A) - 45 MINS CYSTOSCOPY WITH RETROGRADE PYELOGRAM (Bilateral) as a surgical intervention.  The patient's history has been reviewed, patient examined, no change in status, stable for surgery.  I have reviewed the patient's chart and labs.  Questions were answered to the patient's satisfaction.     [name]

## 2022-06-14 NOTE — Anesthesia Postprocedure Evaluation (Signed)
Anesthesia Post Note  Patient: Wyatt Berry  Procedure(s) Performed: TRANSURETHRAL RESECTION OF BLADDER TUMOR (TURBT), BLADDER BIOPSY CYSTOSCOPY WITH RETROGRADE PYELOGRAM (Bilateral)     Patient location during evaluation: PACU Anesthesia Type: General Level of consciousness: sedated Pain management: pain level controlled Vital Signs Assessment: post-procedure vital signs reviewed and stable Respiratory status: spontaneous breathing and respiratory function stable Cardiovascular status: stable Postop Assessment: no apparent nausea or vomiting Anesthetic complications: no   No notable events documented.  Last Vitals:  Vitals:   06/14/22 1300 06/14/22 1315  BP: (!) 156/89 (!) 167/101  Pulse: 73 71  Resp: 13   Temp:  36.6 C  SpO2: 94% 94%    Last Pain:  Vitals:   06/14/22 1315  TempSrc:   PainSc: 0-No pain                 Tracie Dore DANIEL

## 2022-06-14 NOTE — Interval H&P Note (Signed)
History and Physical Interval Note:  06/14/2022 9:42 AM  Wyatt Berry  has presented today for surgery, with the diagnosis of BLADDER CANCER.  The various methods of treatment have been discussed with the patient and family. After consideration of risks, benefits and other options for treatment, the patient has consented to  Procedure(s) with comments: TRANSURETHRAL RESECTION OF BLADDER TUMOR (TURBT) (N/A) - 45 MINS CYSTOSCOPY WITH RETROGRADE PYELOGRAM (Bilateral) as a surgical intervention.  The patient's history has been reviewed, patient examined, no change in status, stable for surgery.  I have reviewed the patient's chart and labs.  Questions were answered to the patient's satisfaction.     [name]

## 2022-06-14 NOTE — Interval H&P Note (Signed)
History and Physical Interval Note:  06/14/2022 9:43 AM  Wyatt Berry  has presented today for surgery, with the diagnosis of BLADDER CANCER.  The various methods of treatment have been discussed with the patient and family. After consideration of risks, benefits and other options for treatment, the patient has consented to  Procedure(s) with comments: TRANSURETHRAL RESECTION OF BLADDER TUMOR (TURBT) (N/A) - 45 MINS CYSTOSCOPY WITH RETROGRADE PYELOGRAM (Bilateral) as a surgical intervention.  The patient's history has been reviewed, patient examined, no change in status, stable for surgery.  I have reviewed the patient's chart and labs.  Questions were answered to the patient's satisfaction.     [name]

## 2022-06-14 NOTE — Anesthesia Procedure Notes (Signed)
Procedure Name: LMA Insertion Date/Time: 06/14/2022 11:30 AM  Performed by: Lind Covert, CRNAPre-anesthesia Checklist: Patient identified, Emergency Drugs available, Suction available and Patient being monitored Patient Re-evaluated:Patient Re-evaluated prior to induction Oxygen Delivery Method: Circle system utilized Preoxygenation: Pre-oxygenation with 100% oxygen Induction Type: IV induction Ventilation: Mask ventilation without difficulty LMA: LMA inserted LMA Size: 5.0 Number of attempts: 1 Placement Confirmation: positive ETCO2 and breath sounds checked- equal and bilateral Dental Injury: Teeth and Oropharynx as per pre-operative assessment

## 2022-06-14 NOTE — Op Note (Addendum)
Preoperative diagnosis:  1.  Bladder cancer   Is a case.  Abuse Is always normal  Postoperative diagnosis: 1.  Bladder cancer  Procedure(s): 1.  Cystoscopy, bilateral retrograde pyelogram with intraoperative interpretation, transurethral section of bladder, multiple bladder tumors.  (Large)  Surgeon: Dr. Karoline Caldwell  Anesthesia: General  Complications: None  EBL: Minimal  Specimens: Bladder tumor  Disposition of specimens: To pathology  Intraoperative findings: Multiple exophytic bladder tumors largest in the right posterior bladder wall approximately 2 cm in size this was removed utilizing rigid biopsy forceps and base fulguration.  Remainder of numerous bladder tumor sites from the right lateral and posterior lateral bladder wall compassing about 4-5 cm with surface area and very superficial.  This was cold looped and then fulgurated at the base.  Bilateral retrogrades were normal  Indication: 72 year old African-American male presented with gross painless hematuria.  Found to have numerous exophytic bladder tumors in the bladder.  Presents this time undergo cystoscopy retrograde and transurethral section of bladder tumor.  Description of procedure:  After obtaining informed consent the patient was taken major cystoscopy suite placed under general anesthesia.  Placed in dorsolithotomy position genitalia prepped and draped in usual sterile fashion.  Proper pause timeout performed.  21 French cystoscope was placed into the bladder with above-noted findings.  The primary tumor in the right posterior bladder wall was removed utilizing rigid biopsy forceps to ensure pathologic integrity to reduce cautery artifact.  This was completely removed and the base cauterized with Bugbee electrode.  Attention was then directed towards the remaining bladder tumor on the right lateral bladder wall.  This was very superficial and can be easily scraped utilizing cold loop.  All of this area was  removed and the base cauterized with the cautery loop.  The frondular tumor was then collected utilizing Urovac irrigator.  All tumor from was removed.  The resection sites were total of about 4 to 5 cm of surface area.  The bladder was inflated and then deflated and the areas reinspected with good hemostasis noted.Bilateral retrograde pyelograms were subsequent performed utilizing 5 French open tip catheter after cannulating both orifice ease.  This revealed normal filling of both upper tract without evidence of filling defect or hydronephrosis.  Both stress and probably upon removal of the retrograde catheter  Procedure was terminated he was awake from anesthesia and taken back to recovery in stable condition.  No immediate complication from the procedure.

## 2022-06-14 NOTE — Transfer of Care (Signed)
Immediate Anesthesia Transfer of Care Note  Patient: Wyatt Berry  Procedure(s) Performed: TRANSURETHRAL RESECTION OF BLADDER TUMOR (TURBT), BLADDER BIOPSY CYSTOSCOPY WITH RETROGRADE PYELOGRAM (Bilateral)  Patient Location: PACU  Anesthesia Type:General  Level of Consciousness: sedated  Airway & Oxygen Therapy: Patient Spontanous Breathing and Patient connected to face mask oxygen  Post-op Assessment: Report given to RN and Post -op Vital signs reviewed and stable  Post vital signs: Reviewed and stable  Last Vitals:  Vitals Value Taken Time  BP 156/92 06/14/22 1233  Temp    Pulse 81 06/14/22 1234  Resp 10 06/14/22 1234  SpO2 97 % 06/14/22 1234  Vitals shown include unvalidated device data.  Last Pain:  Vitals:   06/14/22 0952  TempSrc:   PainSc: 0-No pain         Complications: No notable events documented.

## 2022-06-15 ENCOUNTER — Encounter (HOSPITAL_COMMUNITY): Payer: Self-pay | Admitting: Urology

## 2022-06-15 LAB — SURGICAL PATHOLOGY

## 2022-06-23 DIAGNOSIS — C678 Malignant neoplasm of overlapping sites of bladder: Secondary | ICD-10-CM | POA: Diagnosis not present

## 2022-06-30 DIAGNOSIS — I714 Abdominal aortic aneurysm, without rupture, unspecified: Secondary | ICD-10-CM | POA: Diagnosis not present

## 2022-06-30 DIAGNOSIS — I10 Essential (primary) hypertension: Secondary | ICD-10-CM | POA: Diagnosis not present

## 2022-06-30 DIAGNOSIS — J449 Chronic obstructive pulmonary disease, unspecified: Secondary | ICD-10-CM | POA: Diagnosis not present

## 2022-06-30 DIAGNOSIS — Z0000 Encounter for general adult medical examination without abnormal findings: Secondary | ICD-10-CM | POA: Diagnosis not present

## 2022-06-30 DIAGNOSIS — C679 Malignant neoplasm of bladder, unspecified: Secondary | ICD-10-CM | POA: Diagnosis not present

## 2022-06-30 DIAGNOSIS — I493 Ventricular premature depolarization: Secondary | ICD-10-CM | POA: Diagnosis not present

## 2022-06-30 DIAGNOSIS — R7303 Prediabetes: Secondary | ICD-10-CM | POA: Diagnosis not present

## 2022-06-30 DIAGNOSIS — K7460 Unspecified cirrhosis of liver: Secondary | ICD-10-CM | POA: Diagnosis not present

## 2022-06-30 DIAGNOSIS — Z86711 Personal history of pulmonary embolism: Secondary | ICD-10-CM | POA: Diagnosis not present

## 2022-06-30 DIAGNOSIS — I700 Atherosclerosis of aorta: Secondary | ICD-10-CM | POA: Diagnosis not present

## 2022-07-01 ENCOUNTER — Ambulatory Visit
Admission: RE | Admit: 2022-07-01 | Discharge: 2022-07-01 | Disposition: A | Discharge status: Home / Self Care | Payer: Medicare Other | Source: Ambulatory Visit | Attending: Pulmonary Disease | Admitting: Pulmonary Disease

## 2022-07-01 DIAGNOSIS — R911 Solitary pulmonary nodule: Secondary | ICD-10-CM

## 2022-07-01 DIAGNOSIS — J439 Emphysema, unspecified: Secondary | ICD-10-CM | POA: Diagnosis not present

## 2022-07-01 DIAGNOSIS — I700 Atherosclerosis of aorta: Secondary | ICD-10-CM | POA: Diagnosis not present

## 2022-07-04 NOTE — Progress Notes (Signed)
CT shows stable nodules. Recommend 6 month follow up scan as we discussed at visit (super D without contrast August 2024). Had requested a f/u visit to discuss scan that was not scheduled. Does he wish to discuss in person? IF so please schedule appointment with me.  Alternatively, need a follow up with me August 2024 after next scan.  Thanks!

## 2022-07-05 ENCOUNTER — Telehealth (HOSPITAL_BASED_OUTPATIENT_CLINIC_OR_DEPARTMENT_OTHER): Payer: Self-pay | Admitting: Pulmonary Disease

## 2022-07-05 DIAGNOSIS — R911 Solitary pulmonary nodule: Secondary | ICD-10-CM

## 2022-07-05 NOTE — Telephone Encounter (Signed)
Called and went over results for CT scan. He verbalized understanding. He is wanting to know if it is ok for him to follow up in August or if he still needs to follow up in [place].   [place] please advise sir

## 2022-07-06 NOTE — Telephone Encounter (Signed)
August is fine with me after CT scan - thanks!

## 2022-07-06 NOTE — Telephone Encounter (Signed)
Called and left voicemail for patient that he was ok to follow up in [place] after [place] scan. Nothing further needed

## 2022-09-29 DIAGNOSIS — C678 Malignant neoplasm of overlapping sites of bladder: Secondary | ICD-10-CM | POA: Diagnosis not present

## 2022-09-30 ENCOUNTER — Other Ambulatory Visit: Payer: Self-pay | Admitting: Urology

## 2022-10-07 NOTE — Progress Notes (Signed)
  COVID Vaccine received:  []  No [x]  Yes Date of any COVID positive Test in last 90 days:   PCP - Georgann Housekeeper MD Cardiologist -    Chest x-ray - Chest CT-07/01/22 [facility] EKG -  03/22/22 [facility] Stress Test -  ECHO - 05/02/16 [facility] Cardiac Cath -    PCR screen: []  Ordered & Completed           []   No Order but Needs PROFEND           []   N/A for this surgery   Surgery Plan:  []  Ambulatory                            []  Outpatient in bed                            []  Admit   Anesthesia:    []  General  []  Spinal                           []   Choice []   MAC   Bowel Prep - []  No  []   Yes ______   Pacemaker / ICD device []  No []  Yes   Spinal Cord Stimulator:[]  No []  Yes       History of Sleep Apnea? []  No []  Yes   CPAP used?- []  No []  Yes     Does the patient monitor blood sugar?          []  No []  Yes  []  N/A   Patient has: []  NO Hx DM   []  Pre-DM                 []  DM1  []   DM2 Does patient have a [employer] or Dexacom? []  No []  Yes   Fasting Blood Sugar Ranges-  Checks Blood Sugar _____ times a day   GLP1 agonist / usual dose -  GLP1 instructions:  SGLT-2 inhibitors / usual dose -  SGLT-2 instructions:    Other Diabetic medications/ instructions:    Blood Thinner / Instructions: Aspirin Instructions:   ERAS Protocol Ordered: []  No  []  Yes PRE-SURGERY []  ENSURE  []  G2  []  No Drink Ordered   Patient is to be NPO after:    Comments:    Activity level: Patient is able / unable to climb a flight of stairs without difficulty; []  No CP  []  No SOB, but would have ___   Patient can / can not perform ADLs without assistance.    Anesthesia review:    Patient denies shortness of breath, fever, cough and chest pain at PAT appointment.   Patient verbalized understanding and agreement to the Pre-Surgical Instructions that were given to them at this PAT appointment. Patient was also educated of the need to review these PAT instructions again prior to his/her surgery.I  reviewed the appropriate phone numbers to call if they have any and questions or concerns

## 2022-10-07 NOTE — Progress Notes (Signed)
COVID Vaccine received:  []  No [x]  Yes Date of any COVID positive Test in last 90 days:  PCP - Georgann Housekeeper MD Cardiologist -   Chest x-ray - Chest CT-07/01/22 [facility] EKG -  03/22/22 EPIC Stress Test -  ECHO - 05/02/16 [facility] Cardiac Cath -   PCR screen: []  Ordered & Completed           []   No Order but Needs PROFEND           []   N/A for this surgery  Surgery Plan:  []  Ambulatory                            []  Outpatient in bed                            []  Admit  Anesthesia:    []  General  []  Spinal                           []   Choice []   MAC  Bowel Prep - []  No  []   Yes ______  Pacemaker / ICD device []  No []  Yes   Spinal Cord Stimulator:[]  No []  Yes       History of Sleep Apnea? []  No []  Yes   CPAP used?- []  No []  Yes    Does the patient monitor blood sugar?          []  No []  Yes  []  N/A  Patient has: []  NO Hx DM   []  Pre-DM                 []  DM1  []   DM2 Does patient have a [employer] or [employer]? []  No []  Yes   Fasting Blood Sugar Ranges-  Checks Blood Sugar _____ times a day  GLP1 agonist / usual dose -  GLP1 instructions:  SGLT-2 inhibitors / usual dose -  SGLT-2 instructions:   Other Diabetic medications/ instructions:   Blood Thinner / Instructions: Aspirin Instructions:  ERAS Protocol Ordered: []  No  []  Yes PRE-SURGERY []  ENSURE  []  G2  []  No Drink Ordered  Patient is to be NPO after:   Comments:   Activity level: Patient is able / unable to climb a flight of stairs without difficulty; []  No CP  []  No SOB, but would have ___   Patient can / can not perform ADLs without assistance.   Anesthesia review:   Patient denies shortness of breath, fever, cough and chest pain at PAT appointment.  Patient verbalized understanding and agreement to the Pre-Surgical Instructions that were given to them at this PAT appointment. Patient was also educated of the need to review these PAT instructions again prior to his/her surgery.I reviewed the appropriate  phone numbers to call if they have any and questions or concerns.

## 2022-10-07 NOTE — Patient Instructions (Addendum)
SURGICAL WAITING ROOM VISITATION  Patients having surgery or a procedure may have no more than 2 support people in the waiting area - these visitors may rotate.    Children under the age of 31 must have an adult with them who is not the patient.  Due to an increase in RSV and influenza rates and associated hospitalizations, children ages 23 and under may not visit patients in [facility] hospitals.  If the patient needs to stay at the hospital during part of their recovery, the visitor guidelines for inpatient rooms apply. Pre-op nurse will coordinate an appropriate time for 1 support person to accompany patient in pre-op.  This support person may not rotate.    Please refer to the [facility] website for the visitor guidelines for Inpatients (after your surgery is over and you are in a regular room).       Your procedure is scheduled on: Oct 11, 2022   Report to [facility] Main Entrance    Report to admitting at  11:45 AM   Call this number if you have problems the morning of surgery [phone]   Do not eat food or drink liquids :After Midnight.         If you have questions, please contact your surgeon's office.   FOLLOW BOWEL PREP AND ANY ADDITIONAL PRE OP INSTRUCTIONS YOU RECEIVED FROM YOUR SURGEON'S OFFICE!!!     Oral Hygiene is also important to reduce your risk of infection.                                    Remember - BRUSH YOUR TEETH THE MORNING OF SURGERY WITH YOUR REGULAR TOOTHPASTE  DENTURES WILL BE REMOVED PRIOR TO SURGERY PLEASE DO NOT APPLY "Poly grip" OR ADHESIVES!!!   Do NOT smoke after Midnight   Take these medicines the morning of surgery with A SIP OF WATER: Amlodipine, Atorvastatin, Metoprolol  Bring CPAP mask and tubing day of surgery.                              You may not have any metal on your body including jewelry, and body piercing             Do not wear lotions, powders, cologne, or deodorant              Men may shave face and  neck.   Do not bring valuables to the hospital. [facility] IS NOT             RESPONSIBLE   FOR VALUABLES.   Contacts, glasses, dentures or bridgework may not be worn into surgery.  DO NOT BRING YOUR HOME MEDICATIONS TO THE HOSPITAL. PHARMACY WILL DISPENSE MEDICATIONS LISTED ON YOUR MEDICATION LIST TO YOU DURING YOUR ADMISSION IN THE HOSPITAL!    Patients discharged on the day of surgery will not be allowed to drive home.  Someone NEEDS to stay with you for the first 24 hours after anesthesia.   Special Instructions: Bring a copy of your healthcare power of attorney and living will documents the day of surgery if you haven't scanned them before.              Please read over the following fact sheets you were given: IF YOU HAVE QUESTIONS ABOUT YOUR PRE-OP INSTRUCTIONS PLEASE CALL [phone]    If  you received a COVID test during your pre-op visit  it is requested that you wear a mask when out in public, stay away from anyone that may not be feeling well and notify your surgeon if you develop symptoms. If you test positive for Covid or have been in contact with anyone that has tested positive in the last 10 days please notify you surgeon.     - Preparing for Surgery Before surgery, you can play an important role.  Because skin is not sterile, your skin needs to be as free of germs as possible.  You can reduce the number of germs on your skin by washing with CHG (chlorahexidine gluconate) soap before surgery.  CHG is an antiseptic cleaner which kills germs and bonds with the skin to continue killing germs even after washing. Please DO NOT use if you have an allergy to CHG or antibacterial soaps.  If your skin becomes reddened/irritated stop using the CHG and inform your nurse when you arrive at Short Stay. Do not shave (including legs and underarms) for at least 48 hours prior to the first CHG shower.  You may shave your face/neck.  Please follow these instructions  carefully:  1.  Shower with CHG Soap the night before surgery and the  morning of surgery.  2.  If you choose to wash your hair, wash your hair first as usual with your normal  shampoo.  3.  After you shampoo, rinse your hair and body thoroughly to remove the shampoo.                             4.  Use CHG as you would any other liquid soap.  You can apply chg directly to the skin and wash.  Gently with a scrungie or clean washcloth.  5.  Apply the CHG Soap to your body ONLY FROM THE NECK DOWN.   Do   not use on face/ open                           Wound or open sores. Avoid contact with eyes, ears mouth and   genitals (private parts).                       Wash face,  Genitals (private parts) with your normal soap.             6.  Wash thoroughly, paying special attention to the area where your    surgery  will be performed.  7.  Thoroughly rinse your body with warm water from the neck down.  8.  DO NOT shower/wash with your normal soap after using and rinsing off the CHG Soap.                9.  Pat yourself dry with a clean towel.            10.  Wear clean pajamas.            11.  Place clean sheets on your bed the night of your first shower and do not  sleep with pets. Day of Surgery : Do not apply any lotions/deodorants the morning of surgery.  Please wear clean clothes to the hospital/surgery center.  FAILURE TO FOLLOW THESE INSTRUCTIONS MAY RESULT IN THE CANCELLATION OF YOUR SURGERY  PATIENT SIGNATURE_________________________________  NURSE SIGNATURE__________________________________  ________________________________________________________________________

## 2022-10-10 ENCOUNTER — Other Ambulatory Visit: Payer: Self-pay

## 2022-10-10 ENCOUNTER — Encounter (HOSPITAL_COMMUNITY)
Admission: RE | Admit: 2022-10-10 | Discharge: 2022-10-10 | Disposition: A | Discharge status: Home / Self Care | Payer: Medicare Other | Source: Ambulatory Visit | Attending: Urology | Admitting: Urology

## 2022-10-10 ENCOUNTER — Encounter (HOSPITAL_COMMUNITY): Payer: Self-pay

## 2022-10-10 VITALS — BP 159/91 | HR 71 | Temp 98.1°F | Resp 18 | Ht 73.0 in | Wt 237.0 lb

## 2022-10-10 DIAGNOSIS — E871 Hypo-osmolality and hyponatremia: Secondary | ICD-10-CM

## 2022-10-10 DIAGNOSIS — R7303 Prediabetes: Secondary | ICD-10-CM

## 2022-10-10 DIAGNOSIS — B182 Chronic viral hepatitis C: Secondary | ICD-10-CM | POA: Diagnosis not present

## 2022-10-10 DIAGNOSIS — I10 Essential (primary) hypertension: Secondary | ICD-10-CM

## 2022-10-10 DIAGNOSIS — Z01812 Encounter for preprocedural laboratory examination: Secondary | ICD-10-CM | POA: Diagnosis not present

## 2022-10-10 DIAGNOSIS — Z01818 Encounter for other preprocedural examination: Secondary | ICD-10-CM

## 2022-10-10 HISTORY — DX: Chronic obstructive pulmonary disease, unspecified: J44.9

## 2022-10-10 LAB — COMPREHENSIVE METABOLIC PANEL
ALT: 19 U/L (ref 0–44)
AST: 27 U/L (ref 15–41)
Albumin: 4.1 g/dL (ref 3.5–5.0)
Alkaline Phosphatase: 54 U/L (ref 38–126)
Anion gap: 10 (ref 5–15)
BUN: 11 mg/dL (ref 8–23)
CO2: 22 mmol/L (ref 22–32)
Calcium: 8.9 mg/dL (ref 8.9–10.3)
Chloride: 104 mmol/L (ref 98–111)
Creatinine, Ser: 1.08 mg/dL (ref 0.61–1.24)
GFR, Estimated: >60 mL/min (ref >60)
Glucose, Bld: 95 mg/dL (ref 70–99)
Potassium: 3.8 mmol/L (ref 3.5–5.1)
Sodium: 136 mmol/L (ref 135–145)
Total Bilirubin: 0.6 mg/dL (ref 0.3–1.2)
Total Protein: 7.7 g/dL (ref 6.5–8.1)

## 2022-10-10 LAB — CBC
HCT: 46.3 % (ref 39.0–52.0)
Hemoglobin: 15.1 g/dL (ref 13.0–17.0)
MCH: 29.7 pg (ref 26.0–34.0)
MCHC: 32.6 g/dL (ref 30.0–36.0)
MCV: 91 fL (ref 80.0–100.0)
Platelets: 224 10*3/uL (ref 150–400)
RBC: 5.09 MIL/uL (ref 4.22–5.81)
RDW: 15.1 % (ref 11.5–15.5)
WBC: 4.9 10*3/uL (ref 4.0–10.5)
nRBC: 0 % (ref 0.0–0.2)

## 2022-10-10 LAB — HEMOGLOBIN A1C
Hgb A1c MFr Bld: 5.7 % — ABNORMAL HIGH (ref 4.8–5.6)
Mean Plasma Glucose: 116.89 mg/dL

## 2022-10-10 LAB — GLUCOSE, CAPILLARY: Glucose-Capillary: 106 mg/dL — ABNORMAL HIGH (ref 70–99)

## 2022-10-10 NOTE — H&P (Signed)
have symptoms of an enlarged prostate.  HPI: Wyatt Berry is a 71 year-old male established patient who is here for symptoms of enlarged prostate.   His symptoms have not gotten worse over the last year. He has been treated with Flomax. The patient has never had a surgical procedure for bladder outlet obstruction to his prostate.   He does not get up at night to urinate. He does not have to wait a long time to start his urinary stream. He does not have to strain or bear down to start his urinary stream. He does have a good size and strength to his urinary stream. He does dribble at the end of urination. He is not having problems with emptying his bladder well.   His urine has not shut off completely. He has not previously had an indwelling catheter in for more than two weeks at a time.   He has had a PSA done. He does not have any family members who have been diagnosed with prostate cancer.   2019 PSA 1.42, increased from 0.96 last year. Denies unexplained weight loss, unexplained bone pain, or unexplained fatigue.    He remains on tamsulosin and typically has no bothersome issues with stream or nocturia. He notes that he drinks fluids later in the day or alcohol weekend, urgency and nocturia are somewhat increased. Denies any painful urination or burning with urination. He denies gross hematuria. Denies any interval treatment for UTI.   He continues to use Viagra for erectile dysfunction prescribed by another provider but with good effect.   Patient diagnosed with a DVT and subsequently pulmonary embolism last year and now on Xarelto.   11/14/2017: He has stable LUTS on flomax 0.4mg  daily   11/29/2018: He is on flomax 0.4mg  daily. He has stable mild LUTS    CC: I have an elevated PSA (Normalized).  HPI: His most recent PSA is 1.2. He has not had any treatment for his elevated PSA which has normalized. He has not had a prostate biopsy done.   He has not had a prostate infection. He has not  had a urinary tract infection. He is not having problems with emptying his bladder well.   11/14/2017: Patient had an increase in PSA from 0.96 to 1.4. on recheck it lowered to 1.2 over a 3 months period. no worsening LUTS.   11/29/2018: no recent PSA. NO new LUTS    CC: I am having trouble with my erections.  HPI: He first stated noticing pain on approximately 10/21/2008. His symptoms did begin gradually. His symptoms have been stable over the last year.   He does have difficulties achieving an erection. He does have problems maintaining his erections. His erections are straight. He has tried Viagra. It did work.   He does not have premature ejaculation. He does not have trouble reaching climax. He does not have anxiety because of the symptoms.   11/14/2017: He has been using viagra 100mg  with good results.   11/29/2018: He takes sildenafil 100mg  with good results   -03/16/20-patient with history of erectile dysfunction and BPH as above. Has been on tamsulosin 0.4 mg daily with mild stable bladder outlet symptoms. From ED standpoint takes sildenafil 100 mg daily with fairly good results. Here for follow-up prostate check and medication renewal. PSA was good at 0.93 on 03/10/2020   -03/17/21-patient with history of erectile dysfunction and BPH as above. BPH managed with tamsulosin 0.4 mg daily with mild stable bladder outlet symptoms. From ED standpoint  continues with sildenafil as needed with fairly good results. His most recent PSA is 1.28 on 03/10/2021 which is relatively stable. Here for follow-up prostate check   -03/18/22-patient with history erectile dysfunction and BPH as above. BPH currently managed with tamsulosin 0.4 mg daily and has stable mild bladder outlet symptoms. Patient does utilize sildenafil as needed for erectile dysfunction. His most recent PSA 0.78 on 03/11/2022. Here for prostate check.  Micro urinalysis shows 10-20 RBCs rare bacteria patient is a long-term smoker half pack to  1 pack/day for some 30 years. Had recent PET scan showing normal upper urinary tract on 03/09/2022. Did discover a lung nodule is scheduled for biopsy in the next week or so.  Postvoid residual is 186 cc   -12/78/23-patient with recent discovery of microhematuria as above. Has had PET scan showing normal upper urinary tract back on 03/09/2022. He is here for cystoscopy to assess bladder. The patient in the interim had lung biopsy which was negative for malignancy but there is still high suspicion that this is cancer and he is opted for some continued CT surveillance which has been scheduled with pulmonary medicine.  Micro urinalysis is clear and urine spun sediment  Cystoscopy is performed today shows moderate bilobar prostatic hypertrophy with approximate 2-1/2 cm prostate urethral length. Bladder shows small 1 cm exophytic tumor in the floor the bladder there is also several satellite papillary tumors along the right lateral bladder wall. Ureteral orifice ease were clear from tumor involvement and effluxing clear urine.   -06/23/22-patient with recent diagnosis of bladder cancer. Underwent cystoscopy and TURBT on 06/14/2022 with path showing noninvasive low-grade papillary urothelial carcinoma. Patient did have extensive surface area involvement which required hot loop and cold loop resection with cauterization. Retrogrades were normal. No postprocedural issues other than the expected urgency with urination. Now doing better.  -09/29/22-patient with recent diagnosis of bladder cancer status post TURBT 06/14/2022 showing low-grade papillary urothelial carcinoma. Had fairly extensive surface area which required hot and cold loop resection. No interim GU issues. Here for follow-up 3 months cystoscopy surveillance.  Micro urinalysis is clear on urine spun sediment  Cystoscopy is performed today and shows: Primary tumor site devoid of tumor and smooth. There does appear to be a slightly raised erythematous area in  the right lateral bladder wall adjacent to the primary tumor site and this is suspicious for low-grade recurrence. Surface area is probably 2 cm or so.     ALLERGIES: No Allergies    MEDICATIONS: Aspirin Ec 81 mg tablet, delayed release 1 tablet PO Daily  Tamsulosin Hcl 0.4 mg capsule  Amlodipine Besylate 2.5 mg tablet  Losartan-Hydrochlorothiazide 100 mg-12.5 mg tablet  Multiple Vitamins     GU PSH: Cystoscopy - 04/29/2022 Cystoscopy TURBT 2-5 cm - 06/14/2022     NON-GU PSH: No Non-GU PSH    GU PMH: Bladder Cancer overlapping sites - 06/23/2022, - 06/10/2022, - 04/29/2022 Microscopic hematuria - 04/29/2022, - 03/18/2022 BPH w/LUTS - 03/18/2022, - 03/17/2021, - 2021, - 2020, - 2019, - 2018, Benign localized prostatic hyperplasia with lower urinary tract symptoms (LUTS), - 2017 Nocturia - 03/18/2022, - 03/17/2021, - 2021, Nocturia, - 2017 ED due to arterial insufficiency - 03/17/2021, - 2021, - 2020, - 2019, Erectile dysfunction due to arterial insufficiency, - 2017 Elevated PSA - 2020, - 2019 Encounter for Prostate Cancer screening, Prostate cancer screening - 2017    NON-GU PMH: Encounter for general adult medical examination without abnormal findings, Encounter for preventive health examination - 2017 Personal  history of other infectious and parasitic diseases, History of hepatitis C - 2017 Vitamin D deficiency, unspecified, Vitamin D deficiency - 2014    FAMILY HISTORY: Congestive Heart Failure - Mother Death In The Family Mother - Mother Diabetes - Mother Family Health Status - Father alive at age 59 - Father renal failure - Mother   SOCIAL HISTORY: Marital Status: Married Preferred Language: English; Ethnicity: Not Hispanic Or Latino; Race: Black or African American     Notes: Current every day smoker, Tobacco Use, Alcohol Use, Marital History - Currently Married, Daily Coffee Consumption (___ Cups/Day), Occupation:   REVIEW OF SYSTEMS:    GU Review Male:   Patient denies  frequent urination, hard to postpone urination, burning/ pain with urination, get up at night to urinate, leakage of urine, stream starts and stops, trouble starting your stream, have to strain to urinate , erection problems, and penile pain.  Gastrointestinal (Upper):   Patient denies nausea, vomiting, and indigestion/ heartburn.  Gastrointestinal (Lower):   Patient denies diarrhea and constipation.  Constitutional:   Patient denies fever, night sweats, weight loss, and fatigue.  Skin:   Patient denies skin rash/ lesion and itching.  Eyes:   Patient denies blurred vision and double vision.  Ears/ Nose/ Throat:   Patient denies sore throat and sinus problems.  Hematologic/Lymphatic:   Patient denies easy bruising and swollen glands.  Cardiovascular:   Patient denies leg swelling and chest pains.  Respiratory:   Patient denies cough and shortness of breath.  Endocrine:   Patient denies excessive thirst.  Musculoskeletal:   Patient denies back pain and joint pain.  Neurological:   Patient denies headaches and dizziness.  Psychologic:   Patient denies depression and anxiety.   VITAL SIGNS: None   GU PHYSICAL EXAMINATION:    Testes: No tenderness, no swelling, no enlargement left testes. No tenderness, no swelling, no enlargement right testes. Normal location left testes. Normal location right testes. No mass, no cyst, no varicocele, no hydrocele left testes. No mass, no cyst, no varicocele, no hydrocele right testes.  Urethral Meatus: Normal size. No lesion, no wart, no discharge, no polyp. Normal location.  Penis: Circumcised, no warts, no cracks. No dorsal Peyronie's plaques, no left corporal Peyronie's plaques, no right corporal Peyronie's plaques, no scarring, no warts. No balanitis, no meatal stenosis.   MULTI-SYSTEM PHYSICAL EXAMINATION:    Constitutional: Well-nourished. No physical deformities. Normally developed. Good grooming.  Neck: Neck symmetrical, not swollen. Normal tracheal  position.  Respiratory: No labored breathing, no use of accessory muscles.   Cardiovascular: Normal temperature, normal extremity pulses, no swelling, no varicosities.  Lymphatic: No enlargement of neck, axillae, groin.  Skin: No paleness, no jaundice, no cyanosis. No lesion, no ulcer, no rash.  Neurologic / Psychiatric: Oriented to time, oriented to place, oriented to person. No depression, no anxiety, no agitation.  Eyes: Normal conjunctivae. Normal eyelids.  Ears, Nose, Mouth, and Throat: Left ear no scars, no lesions, no masses. Right ear no scars, no lesions, no masses. Nose no scars, no lesions, no masses. Normal hearing. Normal lips.  Musculoskeletal: Normal gait and station of head and neck.     Complexity of Data:   03/11/22 03/10/21 03/10/20 11/29/18 11/07/17 08/01/17 08/09/16 07/14/15  PSA  Total PSA 0.78 ng/mL 1.28 ng/mL 0.93 ng/mL 0.84 ng/mL 1.20 ng/mL 1.42 ng/mL 0.96 ng/dl 1.61     09/60/45  Hormones  Testosterone, Total 901.0     PROCEDURES:         Flexible Cystoscopy -  52000  Risks, benefits, and some of the potential complications of the procedure were discussed at length with the patient including infection, bleeding, voiding discomfort, urinary retention, fever, chills, sepsis, and others. All questions were answered. Informed consent was obtained. Antibiotic prophylaxis was given. Sterile technique and intraurethral analgesia were used.  Meatus:  Normal size. Normal location. Normal condition.  Urethra:  No strictures.  External Sphincter:  Normal.  Verumontanum:  Normal.  Prostate:  Non-obstructing. No hyperplasia.  Bladder Neck:  Non-obstructing.  Ureteral Orifices:  Normal location. Normal size. Normal shape. Effluxed clear urine.  Bladder:  Cystoscopy is performed today and shows: Primary tumor site devoid of tumor and smooth. There does appear to be a slightly raised erythematous area in the right lateral bladder wall adjacent to the primary tumor site and  this is suspicious for low-grade recurrence. Surface area is probably 2 cm or so.      The lower urinary tract was carefully examined. The procedure was well-tolerated and without complications. Antibiotic instructions were given. Instructions were given to call the office immediately for bloody urine, difficulty urinating, urinary retention, painful or frequent urination, fever, chills, nausea, vomiting or other illness. The patient stated that he understood these instructions and would comply with them.         Urinalysis Dipstick Dipstick Cont'd  Color: Straw Bilirubin: Neg mg/dL  Appearance: Clear Ketones: Neg mg/dL  Specific Gravity: 1.610 Blood: Neg ery/uL  pH: 7.0 Protein: Neg mg/dL  Glucose: Neg mg/dL Urobilinogen: 0.2 mg/dL    Nitrites: Neg    Leukocyte Esterase: Neg leu/uL    ASSESSMENT:      ICD-10 Details  1 GU:   Bladder Cancer overlapping sites - C67.8 Acute, Complicated Injury   PLAN:           Document Letter(s):  Created for Patient: Clinical Summary         Notes:   Discussed cystoscopic findings suggestive of probable recurrent bladder cancer. Recommended cystoscopy bilateral retrogrades and TURBT. Will schedule accordingly in the near future risk benefits discussed as outlined below.  TURBT consent: I have discussed with the patient the risks, benefits of TURBT which include but are not limited to: Bleeding, infection, damage to the bladder with potential perforation of the bladder, damage to surrounding organs, possible need for further procedures including open repair and catheterization, possibility of nonhealing area within the bladder, urgency, frequency which may be refractory to medications. I pointed out that in some occasions after resection of the bladder tumor, mitomycin-C chemotherapy may be instilled into the bladder. The risks associated with this therapy include but are not limited to: Refractory or new onset urgency, frequency, dysuria, infrequently  severe systemic side effects secondary to mitomycin-C. After full discussion of the risks, benefits and alternatives, the patient has consented to the above procedure and desires to proceed.

## 2022-10-11 ENCOUNTER — Ambulatory Visit (HOSPITAL_COMMUNITY): Payer: Medicare Other | Admitting: Physician Assistant

## 2022-10-11 ENCOUNTER — Ambulatory Visit (HOSPITAL_COMMUNITY)
Admission: RE | Admit: 2022-10-11 | Discharge: 2022-10-11 | Disposition: A | Discharge status: Home / Self Care | Payer: Medicare Other | Attending: Urology | Admitting: Urology

## 2022-10-11 ENCOUNTER — Ambulatory Visit (HOSPITAL_COMMUNITY): Payer: Medicare Other

## 2022-10-11 ENCOUNTER — Encounter (HOSPITAL_COMMUNITY)
Admission: RE | Disposition: A | Discharge status: Home / Self Care | Payer: Self-pay | Source: Home / Self Care | Attending: Urology

## 2022-10-11 ENCOUNTER — Encounter (HOSPITAL_COMMUNITY): Payer: Self-pay | Admitting: Urology

## 2022-10-11 ENCOUNTER — Ambulatory Visit (HOSPITAL_BASED_OUTPATIENT_CLINIC_OR_DEPARTMENT_OTHER): Payer: Medicare Other | Admitting: Anesthesiology

## 2022-10-11 DIAGNOSIS — E871 Hypo-osmolality and hyponatremia: Secondary | ICD-10-CM

## 2022-10-11 DIAGNOSIS — F17210 Nicotine dependence, cigarettes, uncomplicated: Secondary | ICD-10-CM | POA: Insufficient documentation

## 2022-10-11 DIAGNOSIS — N3289 Other specified disorders of bladder: Secondary | ICD-10-CM | POA: Diagnosis not present

## 2022-10-11 DIAGNOSIS — Z7901 Long term (current) use of anticoagulants: Secondary | ICD-10-CM | POA: Diagnosis not present

## 2022-10-11 DIAGNOSIS — J449 Chronic obstructive pulmonary disease, unspecified: Secondary | ICD-10-CM

## 2022-10-11 DIAGNOSIS — I10 Essential (primary) hypertension: Secondary | ICD-10-CM

## 2022-10-11 DIAGNOSIS — Z86718 Personal history of other venous thrombosis and embolism: Secondary | ICD-10-CM | POA: Diagnosis not present

## 2022-10-11 DIAGNOSIS — F17200 Nicotine dependence, unspecified, uncomplicated: Secondary | ICD-10-CM | POA: Diagnosis not present

## 2022-10-11 DIAGNOSIS — C678 Malignant neoplasm of overlapping sites of bladder: Secondary | ICD-10-CM | POA: Diagnosis not present

## 2022-10-11 DIAGNOSIS — C679 Malignant neoplasm of bladder, unspecified: Secondary | ICD-10-CM | POA: Diagnosis not present

## 2022-10-11 DIAGNOSIS — Z86711 Personal history of pulmonary embolism: Secondary | ICD-10-CM | POA: Insufficient documentation

## 2022-10-11 HISTORY — PX: CYSTOSCOPY W/ RETROGRADES: SHX1426

## 2022-10-11 HISTORY — PX: TRANSURETHRAL RESECTION OF BLADDER TUMOR: SHX2575

## 2022-10-11 SURGERY — TURBT (TRANSURETHRAL RESECTION OF BLADDER TUMOR)
Anesthesia: General

## 2022-10-11 MED ORDER — LACTATED RINGERS IV SOLN: INTRAVENOUS | Status: DC

## 2022-10-11 MED ORDER — IOHEXOL 300 MG/ML  SOLN
INTRAMUSCULAR | Status: DC | PRN
  Administered 2022-10-11: 15 mL

## 2022-10-11 MED ORDER — CHLORHEXIDINE GLUCONATE 0.12 % MT SOLN
15.0000 mL | Freq: Once | OROMUCOSAL | Status: AC
  Administered 2022-10-11: 15 mL via OROMUCOSAL

## 2022-10-11 MED ORDER — ONDANSETRON HCL 4 MG/2ML IJ SOLN
INTRAMUSCULAR | Status: DC | PRN
  Administered 2022-10-11: 4 mg via INTRAVENOUS

## 2022-10-11 MED ORDER — CHLORHEXIDINE GLUCONATE 0.12 % MT SOLN: 15.0000 mL | Freq: Once | OROMUCOSAL | Status: DC

## 2022-10-11 MED ORDER — FENTANYL CITRATE PF 50 MCG/ML IJ SOSY: 25.0000 ug | PREFILLED_SYRINGE | INTRAMUSCULAR | Status: DC | PRN

## 2022-10-11 MED ORDER — FENTANYL CITRATE (PF) 100 MCG/2ML IJ SOLN
INTRAMUSCULAR | Status: DC | PRN
  Administered 2022-10-11 (×2): 50 ug via INTRAVENOUS

## 2022-10-11 MED ORDER — DEXAMETHASONE SODIUM PHOSPHATE 4 MG/ML IJ SOLN
INTRAMUSCULAR | Status: DC | PRN
  Administered 2022-10-11: 5 mg via INTRAVENOUS

## 2022-10-11 MED ORDER — 0.9 % SODIUM CHLORIDE (POUR BTL) OPTIME
TOPICAL | Status: DC | PRN
  Administered 2022-10-11: 1000 mL

## 2022-10-11 MED ORDER — ONDANSETRON HCL 4 MG/2ML IJ SOLN: 4.0000 mg | Freq: Four times a day (QID) | INTRAMUSCULAR | Status: DC | PRN

## 2022-10-11 MED ORDER — ONDANSETRON HCL 4 MG/2ML IJ SOLN
INTRAMUSCULAR | Status: AC
  Filled 2022-10-11: qty 2

## 2022-10-11 MED ORDER — DEXAMETHASONE SODIUM PHOSPHATE 10 MG/ML IJ SOLN
INTRAMUSCULAR | Status: AC
  Filled 2022-10-11: qty 1

## 2022-10-11 MED ORDER — OXYCODONE HCL 5 MG PO TABS: 5.0000 mg | ORAL_TABLET | Freq: Once | ORAL | Status: DC | PRN

## 2022-10-11 MED ORDER — OXYCODONE HCL 5 MG/5ML PO SOLN: 5.0000 mg | Freq: Once | ORAL | Status: DC | PRN

## 2022-10-11 MED ORDER — CEFAZOLIN SODIUM-DEXTROSE 2-4 GM/100ML-% IV SOLN
2.0000 g | INTRAVENOUS | Status: AC
  Administered 2022-10-11: 2 g via INTRAVENOUS
  Filled 2022-10-11: qty 100

## 2022-10-11 MED ORDER — FENTANYL CITRATE (PF) 100 MCG/2ML IJ SOLN
INTRAMUSCULAR | Status: AC
  Filled 2022-10-11: qty 2

## 2022-10-11 MED ORDER — ORAL CARE MOUTH RINSE: 15.0000 mL | Freq: Once | OROMUCOSAL | Status: DC

## 2022-10-11 MED ORDER — PROPOFOL 10 MG/ML IV BOLUS
INTRAVENOUS | Status: AC
  Filled 2022-10-11: qty 20

## 2022-10-11 MED ORDER — ORAL CARE MOUTH RINSE: 15.0000 mL | Freq: Once | OROMUCOSAL | Status: AC

## 2022-10-11 MED ORDER — PROPOFOL 10 MG/ML IV BOLUS
INTRAVENOUS | Status: DC | PRN
  Administered 2022-10-11: 200 mg via INTRAVENOUS
  Administered 2022-10-11: 50 mg via INTRAVENOUS

## 2022-10-11 MED ORDER — LIDOCAINE HCL (PF) 2 % IJ SOLN
INTRAMUSCULAR | Status: AC
  Filled 2022-10-11: qty 5

## 2022-10-11 MED ORDER — LIDOCAINE HCL (CARDIAC) PF 100 MG/5ML IV SOSY
PREFILLED_SYRINGE | INTRAVENOUS | Status: DC | PRN
  Administered 2022-10-11: 60 mg via INTRAVENOUS

## 2022-10-11 MED ORDER — STERILE WATER FOR IRRIGATION IR SOLN
Status: DC | PRN
  Administered 2022-10-11: 3000 mL

## 2022-10-11 SURGICAL SUPPLY — 25 items
BAG COUNTER SPONGE SURGICOUNT (BAG) IMPLANT
BAG DRN RND TRDRP ANRFLXCHMBR (UROLOGICAL SUPPLIES)
BAG SPNG CNTER NS LX DISP (BAG)
BAG URINE DRAIN 2000ML AR STRL (UROLOGICAL SUPPLIES) IMPLANT
BAG URO CATCHER STRL LF (MISCELLANEOUS) ×2 IMPLANT
CATH URETL OPEN END 6FR 70 (CATHETERS) IMPLANT
CLOTH BEACON ORANGE TIMEOUT ST (SAFETY) ×2 IMPLANT
DRAPE FOOT SWITCH (DRAPES) ×2 IMPLANT
ELECT REM PT RETURN 15FT ADLT (MISCELLANEOUS) IMPLANT
EVACUATOR MICROVAS BLADDER (UROLOGICAL SUPPLIES) IMPLANT
GLOVE SURG LX STRL 7.5 STRW (GLOVE) ×2 IMPLANT
GOWN STRL REUS W/ TWL XL LVL3 (GOWN DISPOSABLE) ×2 IMPLANT
GOWN STRL REUS W/TWL XL LVL3 (GOWN DISPOSABLE) ×2
GUIDEWIRE STR DUAL SENSOR (WIRE) ×2 IMPLANT
HOLDER FOLEY CATH W/STRAP (MISCELLANEOUS) IMPLANT
KIT TURNOVER KIT A (KITS) IMPLANT
LOOP CUT BIPOLAR 24F LRG (ELECTROSURGICAL) IMPLANT
MANIFOLD NEPTUNE II (INSTRUMENTS) ×2 IMPLANT
NS IRRIG 1000ML POUR BTL (IV SOLUTION) IMPLANT
PACK CYSTO (CUSTOM PROCEDURE TRAY) ×2 IMPLANT
SYR 30ML LL (SYRINGE) IMPLANT
SYR TOOMEY IRRIG 70ML (MISCELLANEOUS)
SYRINGE TOOMEY IRRIG 70ML (MISCELLANEOUS) IMPLANT
TUBING CONNECTING 10 (TUBING) ×2 IMPLANT
TUBING UROLOGY SET (TUBING) ×2 IMPLANT

## 2022-10-11 NOTE — Transfer of Care (Signed)
Immediate Anesthesia Transfer of Care Note  Patient: Wyatt Berry  Procedure(s) Performed: TRANSURETHRAL RESECTION OF BLADDER TUMOR (TURBT) CYSTOSCOPY WITH RETROGRADE PYELOGRAM (Bilateral)  Patient Location: PACU  Anesthesia Type:General  Level of Consciousness: awake and patient cooperative  Airway & Oxygen Therapy: Patient Spontanous Breathing and Patient connected to face mask  Post-op Assessment: Report given to RN and Post -op Vital signs reviewed and stable  Post vital signs: Reviewed and stable  Last Vitals:  Vitals Value Taken Time  BP 147/82 10/11/22 1511  Temp    Pulse 67 10/11/22 1513  Resp 11 10/11/22 1513  SpO2 100 % 10/11/22 1513  Vitals shown include unvalidated device data.  Last Pain:  Vitals:   10/11/22 1218  TempSrc: Oral  PainSc: 0-No pain         Complications: No notable events documented.

## 2022-10-11 NOTE — Interval H&P Note (Signed)
History and Physical Interval Note:  10/11/2022 12:40 PM  Wyatt Berry  has presented today for surgery, with the diagnosis of BLADDER CANCER.  The various methods of treatment have been discussed with the patient and family. After consideration of risks, benefits and other options for treatment, the patient has consented to  Procedure(s) with comments: TRANSURETHRAL RESECTION OF BLADDER TUMOR (TURBT) (N/A) - 45 MINS CYSTOSCOPY WITH RETROGRADE PYELOGRAM (Bilateral) as a surgical intervention.  The patient's history has been reviewed, patient examined, no change in status, stable for surgery.  I have reviewed the patient's chart and labs.  Questions were answered to the patient's satisfaction.     [name]

## 2022-10-11 NOTE — Op Note (Signed)
Preoperative diagnosis:  1.  Recurrent bladder cancer  Postoperative diagnosis: 1.  Recurrent bladder cancer  Procedure(s): 1.  Cystoscopy, bilateral retrograde pyelogram with intraoperative interpretation, transurethral section of bladder tumor (medium sized)  Surgeon: Dr. Karoline Caldwell  Anesthesia: General  Complications: None  EBL: Minimal  Specimens: Bladder tumor  Disposition of specimens: To pathology  Intraoperative findings: Patient with likely recurrent tumor adjacent to prior TURBT site right lateral bladder wall compassing 2-1/2 to 3 cm of surface area.  Tumor removed utilizing rigid biopsy forceps to preserve pathologic integrity.  Base fulgurated.  Bilateral retrogrades were normal.  All visible tumor removed and were fulgurated.  Indication: Patient is a 72 year old African-American male with history of low-grade papillary urothelial carcinoma status post TURBT 06/14/2022.  On surveillance cystoscopy found to have what appears to be flat erythematous recurrence in the prior TURBT site.  Presents the time and get cystoscopy TURBT and bilateral retrogrades.  Description of procedure:  After obtaining informed consent the patient was taken to the major cystoscopy suite placed under general anesthesia.  Placed in dorsolithotomy position genitalia prepped and draped in usual sterile fashion.  Proper pause timeout performed.  21 Fransico was advanced in the bladder with above-noted findings.  Ureteral orifice ease were normal position and refluxing clear urine.  5 French open tip catheter was utilized to cannulate the ureteral orifice ease.  Retrograde pyelogram confirmed some distal J hooking but no evidence of filling defect proximally and no evidence of hydronephrosis.  Both upper tracts emptied out promptly upon removal of the retrograde catheter. Patient then directed to removal of bladder tumor.  In order to preserve pathologic integrity it is felt this area would be best  removed utilizing rigid biopsy forceps with subsequent fulguration.  Utilizing rigid biopsy forceps 5 or 6 areas of tissue were removed essentially remove the entire area.  The base was then fulgurated with the Bugbee electrode and the area of resection as well as a circumferential area of margin to ensure complete treatment of the area.  Good hemostasis was noted.  No other tumor was noted in the bladder.  The bladder was emptied and read inspected after reinflation with good hemostasis noted.  Procedure was terminated he was awakened from anesthesia and taken back to recovery in stable condition.  No immediate complication from the procedure.

## 2022-10-11 NOTE — Anesthesia Procedure Notes (Signed)
Procedure Name: LMA Insertion Date/Time: 10/11/2022 2:23 PM  Performed by: Caren Macadam, CRNAPre-anesthesia Checklist: Patient identified, Emergency Drugs available, Suction available and Patient being monitored Patient Re-evaluated:Patient Re-evaluated prior to induction Oxygen Delivery Method: Circle system utilized Preoxygenation: Pre-oxygenation with 100% oxygen Induction Type: IV induction Ventilation: Mask ventilation without difficulty LMA: LMA inserted LMA Size: 5.0 Number of attempts: 1 Placement Confirmation: positive ETCO2 and breath sounds checked- equal and bilateral Tube secured with: Tape Dental Injury: Teeth and Oropharynx as per pre-operative assessment

## 2022-10-11 NOTE — Anesthesia Preprocedure Evaluation (Signed)
Anesthesia Evaluation  Patient identified by MRN, date of birth, ID band Patient awake    Reviewed: Allergy & Precautions, H&P , NPO status , Patient's Chart, lab work & pertinent test results  Airway Mallampati: II   Neck ROM: full    Dental   Pulmonary COPD, Current Smoker Lung nodule   breath sounds clear to auscultation       Cardiovascular hypertension,  Rhythm:regular Rate:Normal     Neuro/Psych    GI/Hepatic ,,,(+) Cirrhosis       , Hepatitis -, C  Endo/Other    Renal/GU    Bladder CA    Musculoskeletal   Abdominal   Peds  Hematology   Anesthesia Other Findings   Reproductive/Obstetrics                             Anesthesia Physical Anesthesia Plan  ASA: 3  Anesthesia Plan: General   Post-op Pain Management:    Induction: Intravenous  PONV Risk Score and Plan: 1 and Ondansetron, Dexamethasone and Treatment may vary due to age or medical condition  Airway Management Planned: LMA  Additional Equipment:   Intra-op Plan:   Post-operative Plan: Extubation in OR  Informed Consent: I have reviewed the patients History and Physical, chart, labs and discussed the procedure including the risks, benefits and alternatives for the proposed anesthesia with the patient or authorized representative who has indicated his/her understanding and acceptance.     Dental advisory given  Plan Discussed with: CRNA, Anesthesiologist and Surgeon  Anesthesia Plan Comments:        Anesthesia Quick Evaluation

## 2022-10-11 NOTE — Anesthesia Postprocedure Evaluation (Signed)
Anesthesia Post Note  Patient: Wyatt Berry  Procedure(s) Performed: TRANSURETHRAL RESECTION OF BLADDER TUMOR (TURBT) CYSTOSCOPY WITH RETROGRADE PYELOGRAM (Bilateral)     Patient location during evaluation: PACU Anesthesia Type: General Level of consciousness: awake and alert Pain management: pain level controlled Vital Signs Assessment: post-procedure vital signs reviewed and stable Respiratory status: spontaneous breathing, nonlabored ventilation, respiratory function stable and patient connected to nasal cannula oxygen Cardiovascular status: blood pressure returned to baseline and stable Postop Assessment: no apparent nausea or vomiting Anesthetic complications: no   No notable events documented.  Last Vitals:  Vitals:   10/11/22 1545 10/11/22 1556  BP: 136/76 (!) 144/86  Pulse: 78 63  Resp: 13 12  Temp:  36.6 C  SpO2: 97% 95%    Last Pain:  Vitals:   10/11/22 1545  TempSrc:   PainSc: 0-No pain                 Yena Tisby S

## 2022-10-12 ENCOUNTER — Encounter (HOSPITAL_COMMUNITY): Payer: Self-pay | Admitting: Urology

## 2022-10-12 LAB — SURGICAL PATHOLOGY

## 2022-10-19 DIAGNOSIS — C678 Malignant neoplasm of overlapping sites of bladder: Secondary | ICD-10-CM | POA: Diagnosis not present

## 2022-11-04 ENCOUNTER — Other Ambulatory Visit: Payer: Self-pay | Admitting: Nurse Practitioner

## 2022-11-04 DIAGNOSIS — K7469 Other cirrhosis of liver: Secondary | ICD-10-CM | POA: Diagnosis not present

## 2022-11-15 DIAGNOSIS — H5213 Myopia, bilateral: Secondary | ICD-10-CM | POA: Diagnosis not present

## 2022-11-15 DIAGNOSIS — H25813 Combined forms of age-related cataract, bilateral: Secondary | ICD-10-CM | POA: Diagnosis not present

## 2022-11-15 DIAGNOSIS — H524 Presbyopia: Secondary | ICD-10-CM | POA: Diagnosis not present

## 2022-11-15 DIAGNOSIS — R7303 Prediabetes: Secondary | ICD-10-CM | POA: Diagnosis not present

## 2022-11-30 ENCOUNTER — Ambulatory Visit
Admission: RE | Admit: 2022-11-30 | Discharge: 2022-11-30 | Disposition: A | Discharge status: Home / Self Care | Payer: Medicare Other | Source: Ambulatory Visit | Attending: Nurse Practitioner | Admitting: Nurse Practitioner

## 2022-11-30 DIAGNOSIS — K8020 Calculus of gallbladder without cholecystitis without obstruction: Secondary | ICD-10-CM | POA: Diagnosis not present

## 2022-11-30 DIAGNOSIS — K7469 Other cirrhosis of liver: Secondary | ICD-10-CM

## 2023-01-13 DIAGNOSIS — I700 Atherosclerosis of aorta: Secondary | ICD-10-CM | POA: Diagnosis not present

## 2023-01-13 DIAGNOSIS — I714 Abdominal aortic aneurysm, without rupture, unspecified: Secondary | ICD-10-CM | POA: Diagnosis not present

## 2023-01-13 DIAGNOSIS — R7303 Prediabetes: Secondary | ICD-10-CM | POA: Diagnosis not present

## 2023-01-13 DIAGNOSIS — C678 Malignant neoplasm of overlapping sites of bladder: Secondary | ICD-10-CM | POA: Diagnosis not present

## 2023-01-13 DIAGNOSIS — I493 Ventricular premature depolarization: Secondary | ICD-10-CM | POA: Diagnosis not present

## 2023-01-13 DIAGNOSIS — I10 Essential (primary) hypertension: Secondary | ICD-10-CM | POA: Diagnosis not present

## 2023-01-13 DIAGNOSIS — K7460 Unspecified cirrhosis of liver: Secondary | ICD-10-CM | POA: Diagnosis not present

## 2023-01-13 DIAGNOSIS — Z86711 Personal history of pulmonary embolism: Secondary | ICD-10-CM | POA: Diagnosis not present

## 2023-01-13 DIAGNOSIS — F17210 Nicotine dependence, cigarettes, uncomplicated: Secondary | ICD-10-CM | POA: Diagnosis not present

## 2023-01-26 DIAGNOSIS — C678 Malignant neoplasm of overlapping sites of bladder: Secondary | ICD-10-CM | POA: Diagnosis not present

## 2023-01-26 DIAGNOSIS — R3912 Poor urinary stream: Secondary | ICD-10-CM | POA: Diagnosis not present

## 2023-02-02 ENCOUNTER — Ambulatory Visit: Payer: Medicare Other | Admitting: Pulmonary Disease

## 2023-02-08 ENCOUNTER — Ambulatory Visit
Admission: RE | Admit: 2023-02-08 | Discharge: 2023-02-08 | Disposition: A | Discharge status: Home / Self Care | Payer: Medicare Other | Source: Ambulatory Visit | Attending: Pulmonary Disease | Admitting: Pulmonary Disease

## 2023-02-08 DIAGNOSIS — I288 Other diseases of pulmonary vessels: Secondary | ICD-10-CM | POA: Diagnosis not present

## 2023-02-08 DIAGNOSIS — R911 Solitary pulmonary nodule: Secondary | ICD-10-CM | POA: Diagnosis not present

## 2023-02-08 DIAGNOSIS — R918 Other nonspecific abnormal finding of lung field: Secondary | ICD-10-CM | POA: Diagnosis not present

## 2023-02-08 DIAGNOSIS — K7460 Unspecified cirrhosis of liver: Secondary | ICD-10-CM | POA: Diagnosis not present

## 2023-02-08 DIAGNOSIS — I700 Atherosclerosis of aorta: Secondary | ICD-10-CM | POA: Diagnosis not present

## 2023-02-08 DIAGNOSIS — J432 Centrilobular emphysema: Secondary | ICD-10-CM | POA: Diagnosis not present

## 2023-02-08 DIAGNOSIS — I2510 Atherosclerotic heart disease of native coronary artery without angina pectoris: Secondary | ICD-10-CM | POA: Diagnosis not present

## 2023-02-09 ENCOUNTER — Telehealth: Payer: Self-pay | Admitting: Pulmonary Disease

## 2023-02-09 NOTE — Telephone Encounter (Signed)
Disc dropped by [facility]. Placed in MH box.

## 2023-02-13 ENCOUNTER — Encounter: Payer: Self-pay | Admitting: Pulmonary Disease

## 2023-02-13 ENCOUNTER — Ambulatory Visit: Payer: Medicare Other | Admitting: Pulmonary Disease

## 2023-02-13 VITALS — BP 150/80 | HR 102 | Ht 74.0 in | Wt 230.4 lb

## 2023-02-13 DIAGNOSIS — F17210 Nicotine dependence, cigarettes, uncomplicated: Secondary | ICD-10-CM | POA: Diagnosis not present

## 2023-02-13 DIAGNOSIS — R911 Solitary pulmonary nodule: Secondary | ICD-10-CM | POA: Diagnosis not present

## 2023-02-13 NOTE — Patient Instructions (Addendum)
Nice to see you again  The CT scan shows stable area of concern on the right lung.  Overall this is probably good news.  We will repeat a CT scan in 6 months and follow-up afterwards to discuss results and any next steps that are needed  Call [phone] and ask about nicotine lozenges or gum to help cut back on cigarette smoking, tell them you do not want the patch but one of the other things if they have them  Return to clinic in 6 months or sooner as needed with Dr. Judeth Horn

## 2023-02-13 NOTE — Progress Notes (Signed)
@Patient  ID: Wyatt Berry, male    DOB: [birth year], 72 y.o.   MRN: [id number]  Chief Complaint  Patient presents with   Follow-up    Pt is here for CT F/U.     Referring provider: Georgann Housekeeper, MD  HPI:   72 y.o. man whom are seeing for follow up of pulmonary nodule discovered on lung cancer screening CT scan 01/2022.  Overall, doing well.  Denies any dyspnea.  Repeat CT scan at 53-month interval performed last week.  This is stable on my review interpretation.  Radiology interpretation agrees.  We discussed concern for malignancy given PET avidity on prior PET scan.  It was biopsied and nondiagnostic.  Discussed concern for cancer and offered repeat biopsy.  He does not want to go through with this.  He prefers continued surveillance with CT scan.  After shared decision making we agreed with ongoing surveillance with CT scan, repeat in 6 months.  HPI at initial visit: Patient usual state of health.  After discussion with PCP underwent lung cancer screening.  This was performed 01/2022.  On my review interpretation there is a area of left upper lobe nodularity/scarring, left lower lobe greater than 1 cm rounded solid lesion and additional right middle lobe rounded solid lesion about 1 cm.  This prompted referral.  He gets a little short of breath exertion at times.  Nothing out of the ordinary.  Minimal cough.  Overall feels fine.  Has decreased his cigarette intake since this finding.  Down to 5 cigarettes a day from half a pack a day prior to the CT scan.  We discussed at length the CT findings and concern for possible malignancy.  The 2 distinct nodules to be a bit atypical but possible for synchronous primaries versus 2 distinct pathologies.  Discussed additional imaging via PET scan would be helpful in terms of deciding neck steps and he agrees to this.  PMH: Tobacco abuse, hypertension Surgical history: Past Surgical History:  Procedure Laterality Date   BRONCHIAL BIOPSY   03/22/2022   Procedure: BRONCHIAL BIOPSIES;  Surgeon: Josephine Igo, DO;  Location: [facility];  Service: Pulmonary;;   BRONCHIAL NEEDLE ASPIRATION BIOPSY  03/22/2022   Procedure: BRONCHIAL NEEDLE ASPIRATION BIOPSIES;  Surgeon: Josephine Igo, DO;  Location: [facility];  Service: Pulmonary;;   COLONOSCOPY     CYSTOSCOPY W/ RETROGRADES Bilateral 06/14/2022   Procedure: CYSTOSCOPY WITH RETROGRADE PYELOGRAM;  Surgeon: [name], MD;  Location: [facility];  Service: Urology;  Laterality: Bilateral;   CYSTOSCOPY W/ RETROGRADES Bilateral 10/11/2022   Procedure: CYSTOSCOPY WITH RETROGRADE PYELOGRAM;  Surgeon: [name], MD;  Location: [facility];  Service: Urology;  Laterality: Bilateral;   TONSILLECTOMY     TRANSURETHRAL RESECTION OF BLADDER TUMOR N/A 06/14/2022   Procedure: TRANSURETHRAL RESECTION OF BLADDER TUMOR (TURBT), BLADDER BIOPSY;  Surgeon: [name], MD;  Location: [facility];  Service: Urology;  Laterality: N/A;  45 MINS   TRANSURETHRAL RESECTION OF BLADDER TUMOR N/A 10/11/2022   Procedure: TRANSURETHRAL RESECTION OF BLADDER TUMOR (TURBT);  Surgeon: [name], MD;  Location: [facility];  Service: Urology;  Laterality: N/A;  45 MINS   VIDEO BRONCHOSCOPY WITH ENDOBRONCHIAL ULTRASOUND Bilateral 03/22/2022   Procedure: VIDEO BRONCHOSCOPY WITH ENDOBRONCHIAL ULTRASOUND;  Surgeon: Josephine Igo, DO;  Location: [facility];  Service: Pulmonary;  Laterality: Bilateral;   Family history: Mother with diabetes, CHF, brother with CAD Social history: Current smoker, down to a few cigarettes a day, historically half  a pack per day, lives in [place] / Pulmonary Flowsheets:   ACT:      No data to display          MMRC:     No data to display          Epworth:      No data to display          Tests:   FENO:  No results found for: "NITRICOXIDE"  PFT:    Latest Ref Rng & Units 04/05/2022   11:32 AM  PFT Results  FVC-Pre L 3.37    FVC-Predicted Pre % 68   FVC-Post L 3.39   FVC-Predicted Post % 68   Pre FEV1/FVC % % 64   Post FEV1/FCV % % 65   FEV1-Pre L 2.16   FEV1-Predicted Pre % 59   FEV1-Post L 2.20   DLCO uncorrected ml/min/mmHg 19.62   DLCO UNC% % 69   DLCO corrected ml/min/mmHg 18.91   DLCO COR %Predicted % 66   DLVA Predicted % 80   TLC L 5.65   TLC % Predicted % 73   RV % Predicted % 77   Personally reviewed and interpreted as moderate fixed obstruction, no bronchodilator response, lung volumes within normal limits, moderately reduced DLCO  WALK:      No data to display          Imaging: Personally reviewed and as per EMR discussion this note CT Super D Chest Wo Contrast  Result Date: 02/13/2023 CLINICAL DATA:  Pulmonary nodule. Bladder cancer with tumor resection. EXAM: CT CHEST WITHOUT CONTRAST TECHNIQUE: Multidetector CT imaging of the chest was performed using thin slice collimation for electromagnetic bronchoscopy planning purposes, without intravenous contrast. RADIATION DOSE REDUCTION: This exam was performed according to the departmental dose-optimization program which includes automated exposure control, adjustment of the mA and/or kV according to patient size and/or use of iterative reconstruction technique. COMPARISON:  07/01/2022, 05/05/2022, 02/23/2022, 01/28/2022 and PET 03/09/2022. FINDINGS: Cardiovascular: Atherosclerotic calcification of the aorta, aortic valve and coronary arteries. Enlarged pulmonic trunk. Heart is at the upper limits of normal in size to mildly enlarged. No pericardial effusion. Mediastinum/Nodes: No pathologically enlarged mediastinal or axillary lymph nodes. Hilar regions are difficult to definitively evaluate without IV contrast. Esophagus is grossly unremarkable. Lungs/Pleura: Centrilobular and paraseptal emphysema. Irregular nodular and cystic mass in the lateral segment right middle lobe measures approximately 2.6 x 3.0 cm (8/97), stable from 01/28/2022. Lesion  was minimally hypermetabolic on 03/10/2022. Posterolateral subpleural left lower lobe nodule measures 10 x 12 mm (8/91), also stable from 01/28/2022. Subsegmental volume loss in both lower lobes with mild cylindrical bronchiectasis. Probable linear scarring in the apical left upper lobe (8/21), stable from 01/28/2022. No pleural fluid. Debris is seen in the airway. Upper Abdomen: Liver margin is irregular. Small low-attenuation lesion in segment 4 of the liver, too small to characterize. Visualized portions of the liver, gallbladder, adrenal glands, kidneys, spleen, pancreas, stomach and bowel are otherwise grossly unremarkable. No upper abdominal adenopathy. Musculoskeletal: Degenerative changes in the spine. IMPRESSION: 1. Irregular cystic and solid mass in the lateral segment right middle lobe, stable from 01/28/2022 and minimally hypermetabolic on 03/09/2022. Findings remain worrisome for adenocarcinoma. 2. Left lower lobe nodule and probable linear scarring in the apical left upper lobe, stable from 01/28/2022. Recommend continued attention on follow-up. 3. Cirrhosis. 4. Aortic atherosclerosis (ICD10-I70.0). Coronary artery calcification. 5. Enlarged pulmonic trunk, indicative of pulmonary arterial hypertension. 6.  Emphysema (ICD10-J43.9). Electronically Signed  By: Leanna Battles M.D.   On: 02/13/2023 09:23    Lab Results: Personally reviewed CBC    Component Value Date/Time   WBC 4.9 10/10/2022 1341   RBC 5.09 10/10/2022 1341   HGB 15.1 10/10/2022 1341   HCT 46.3 10/10/2022 1341   PLT 224 10/10/2022 1341   MCV 91.0 10/10/2022 1341   MCH 29.7 10/10/2022 1341   MCHC 32.6 10/10/2022 1341   RDW 15.1 10/10/2022 1341   LYMPHSABS 2.4 09/18/2020 1008   MONOABS 0.4 09/18/2020 1008   EOSABS 0.0 09/18/2020 1008   BASOSABS 0.0 09/18/2020 1008    BMET    Component Value Date/Time   NA 136 10/10/2022 1341   K 3.8 10/10/2022 1341   CL 104 10/10/2022 1341   CO2 22 10/10/2022 1341   GLUCOSE 95  10/10/2022 1341   BUN 11 10/10/2022 1341   CREATININE 1.08 10/10/2022 1341   CALCIUM 8.9 10/10/2022 1341   GFRNONAA >60 10/10/2022 1341   GFRAA >60 05/03/2016 0224    BNP    Component Value Date/Time   BNP 74.6 05/01/2016 2304    ProBNP No results found for: "PROBNP"  Specialty Problems       Pulmonary Problems   Acute respiratory failure with hypoxia (HCC)   Lung nodule    No Known Allergies  Immunization History  Administered Date(s) Administered   Fluad Quad(high Dose 65+) 02/24/2019   Influenza, High Dose Seasonal PF 04/05/2018   PFIZER(Purple Top)SARS-COV-2 Vaccination 06/29/2019, 07/20/2019   Pneumococcal Conjugate-13 06/08/2017   Pneumococcal Polysaccharide-23 02/21/2013   Tdap 02/21/2013   Zoster Recombinant(Shingrix) 06/16/2018, 03/11/2019   Zoster, Live 10/28/2014, 06/16/2018, 03/11/2019    Past Medical History:  Diagnosis Date   AAA (abdominal aortic aneurysm) without rupture (HCC) 01/01/2021   Cancer (HCC)    bladder   Cirrhosis of liver (HCC)    COPD (chronic obstructive pulmonary disease) (HCC)    Per patient mild   DVT (deep venous thrombosis) (HCC) 05/02/2016   subacute LLE DVT 05/02/16   Hepatitis C    HCV, s/p treatment   HLD (hyperlipidemia)    Hypertension    Lung nodule 02/23/2022   left lung   PE (pulmonary thromboembolism) (HCC) 05/02/2016   bilateral at least submassive PE with right heart strain, s/p Xarelto x 4 years   Pre-diabetes 12/2020   no meds    Tobacco History: Social History   Tobacco Use  Smoking Status Some Days   Current packs/day: 0.25   Average packs/day: 0.3 packs/day for 51.7 years (12.9 ttl pk-yrs)   Types: Cigarettes   Start date: 1973  Smokeless Tobacco Never  Tobacco Comments   Pt states that he smokes less than 5 cigarettes a day. 04/05/22 ALS    Ready to quit: Not Answered Counseling given: Not Answered Tobacco comments: Pt states that he smokes less than 5 cigarettes a day. 04/05/22 ALS       Outpatient Encounter Medications as of 02/13/2023  Medication Sig   amLODipine (NORVASC) 5 MG tablet Take 5 mg by mouth daily.   aspirin EC 81 MG tablet Take 81 mg by mouth daily.   atorvastatin (LIPITOR) 10 MG tablet Take 10 mg by mouth daily.   losartan-hydrochlorothiazide (HYZAAR) 100-12.5 MG per tablet Take 1 tablet by mouth daily.   metoprolol succinate (TOPROL-XL) 25 MG 24 hr tablet Take 25 mg by mouth at bedtime.   Multiple Vitamin (MULTIVITAMIN) tablet Take 1 tablet by mouth daily.   tamsulosin (FLOMAX) 0.4 MG CAPS capsule  Take 0.4 mg by mouth at bedtime.   No facility-administered encounter medications on file as of 02/13/2023.     Review of Systems  Review of Systems  N/a Physical Exam  BP (!) 150/80 (BP Location: Left Arm, Cuff Size: Large)   Pulse (!) 102   Ht 6\' 2"  (1.88 m)   Wt 230 lb 6.4 oz (104.5 kg)   SpO2 96%   BMI 29.58 kg/m   Wt Readings from Last 5 Encounters:  02/13/23 230 lb 6.4 oz (104.5 kg)  10/11/22 237 lb (107.5 kg)  10/10/22 237 lb (107.5 kg)  06/14/22 237 lb 0.6 oz (107.5 kg)  06/08/22 237 lb (107.5 kg)    BMI Readings from Last 5 Encounters:  02/13/23 29.58 kg/m  10/11/22 31.27 kg/m  10/10/22 31.27 kg/m  06/14/22 31.27 kg/m  06/08/22 31.27 kg/m     Physical Exam General: Sitting in chair, no acute distress Eyes: EOMI, no icterus Neck: Supple, no JVP Pulmonary: Clear, normal work of breathing Cardiovascular: Warm, no edema Abdomen: Nondistended, sounds present MSK: No synovitis, no joint effusion Neuro: Normal gait, no weakness Psych: Normal mood, full affect   Assessment & Plan:   Lung nodules: Rounded left lower lobe, right lower lobe as well as left upper lobe fibrotic appearing lesion. LLL not PET AVID, RML is PET avid. S/p robotic navigational bronchoscopy with out evidence of malignancy.  Given PET avidity and size, high suspicion for malignancy overall.  Fortunately, stable on CT scans March 2024, September  2024.  1 year stability. Discussed referral to thoracic surgery versus radiation oncology for empiric radiation to the area.  At this time, he wishes for ongoing surveillance imaging.  He declines referral to cardiothoracic surgery and declines referral to radiation oncology.  He declines repeat biopsy.  Continue surveillance, interval 67-month follow-up CT scan ordered for 07/2023.  Notably he was encouraged to quit smoking prior to possible thoracic surgery evaluation.  COPD: Gold B.  Relatively minimal symptoms. No inhalers. Moderate reduction in FEV1, 59%.  If thoracic surgery evaluation is needed, may benefit for additional testing prior to surgery if indicated.  Tobacco abuse: Smoking assessment and cessation counseling Patient currently smoking: I have advised the patient to quit/stop smoking as soon as possible due to high risk for multiple medical problems.  It will also be very difficult for [place] to manage patient's  respiratory symptoms and status if we continue to expose her lungs to a known irritant.  We do not advise e-cigarettes as a form of stopping smoking. Patient is  willing to quit smoking. I have advised the patient that we can assist and have options of nicotine replacement therapy, provided smoking cessation education today, provided smoking cessation counseling, and provided cessation resources.  Stressed that he would need to stop smoking prior to surgical intervention if needed for possible lung cancer.  Chantix prescribed today.  Follow-up next office visit office visit for assessment of smoking cessation.  I spent 3 minutes in tobacco/smoking cessation counseling.   Return in about 6 months (around 08/13/2023) for f/u Dr. Judeth Horn, after CT scan.   Karren Burly, MD 02/13/2023

## 2023-04-27 DIAGNOSIS — C678 Malignant neoplasm of overlapping sites of bladder: Secondary | ICD-10-CM | POA: Diagnosis not present

## 2023-04-27 DIAGNOSIS — R3912 Poor urinary stream: Secondary | ICD-10-CM | POA: Diagnosis not present

## 2023-05-04 ENCOUNTER — Other Ambulatory Visit: Payer: Self-pay | Admitting: Nurse Practitioner

## 2023-05-04 DIAGNOSIS — K7469 Other cirrhosis of liver: Secondary | ICD-10-CM | POA: Diagnosis not present

## 2023-05-08 ENCOUNTER — Ambulatory Visit
Admission: RE | Admit: 2023-05-08 | Discharge: 2023-05-08 | Disposition: A | Discharge status: Home / Self Care | Payer: Medicare Other | Source: Ambulatory Visit | Attending: Nurse Practitioner

## 2023-05-08 DIAGNOSIS — K828 Other specified diseases of gallbladder: Secondary | ICD-10-CM | POA: Diagnosis not present

## 2023-05-08 DIAGNOSIS — K7469 Other cirrhosis of liver: Secondary | ICD-10-CM

## 2023-05-08 DIAGNOSIS — K760 Fatty (change of) liver, not elsewhere classified: Secondary | ICD-10-CM | POA: Diagnosis not present

## 2023-06-02 IMAGING — US US ABDOMEN LIMITED
1 series · 14 of 25 positions shown · non-contrast
Comparison: 05/08/2020

CLINICAL DATA: Cirrhosis, screening for hepatocellular carcinoma

EXAM:
ULTRASOUND ABDOMEN LIMITED RIGHT UPPER QUADRANT

[Series 1: us abdomen limited · 0.20mm/px · 14 of 40 slices shown]
[im 1/40]
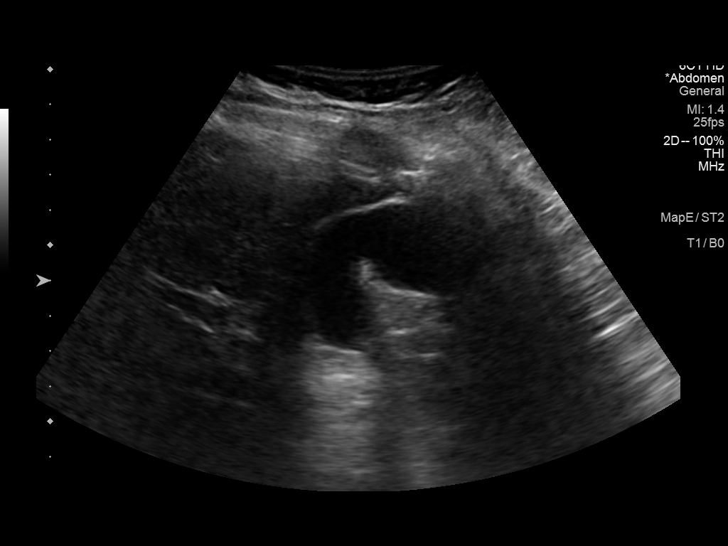
[im 4/40]
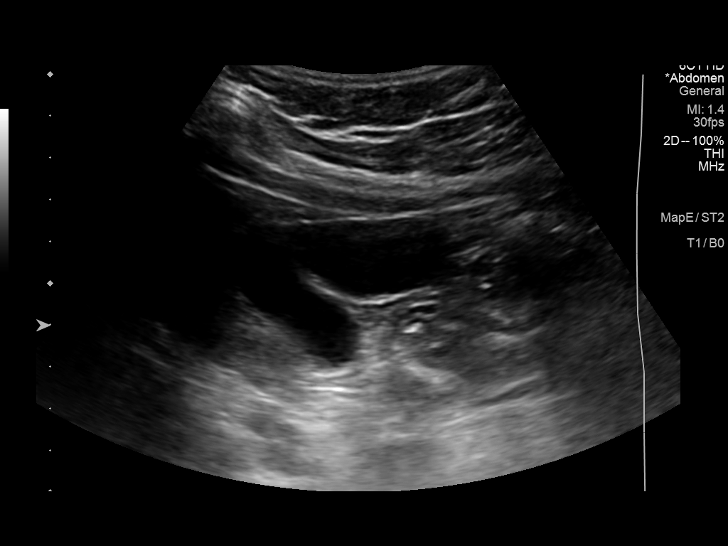
[im 7/40]
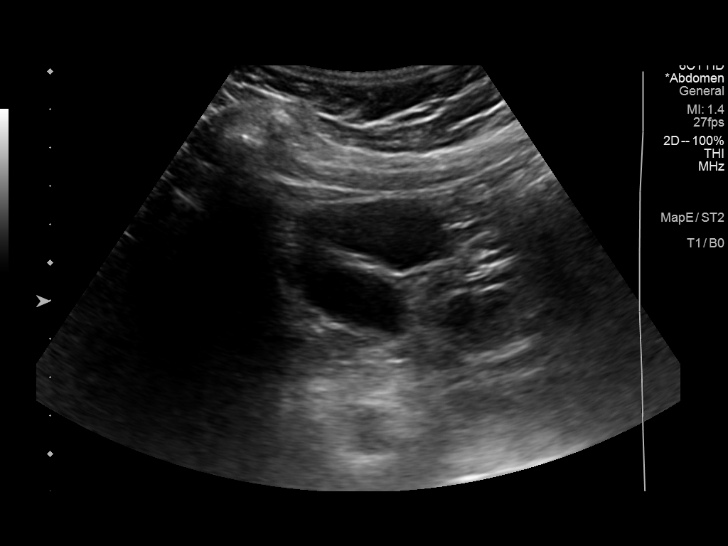
[im 10/40]
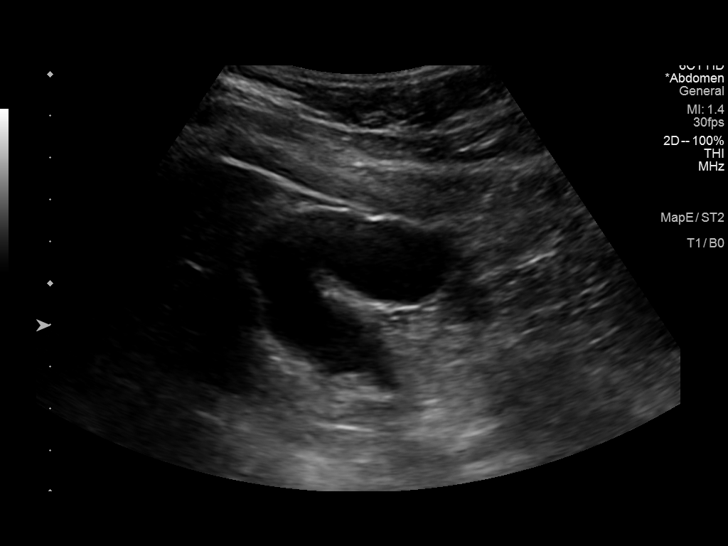
[im 14/40]
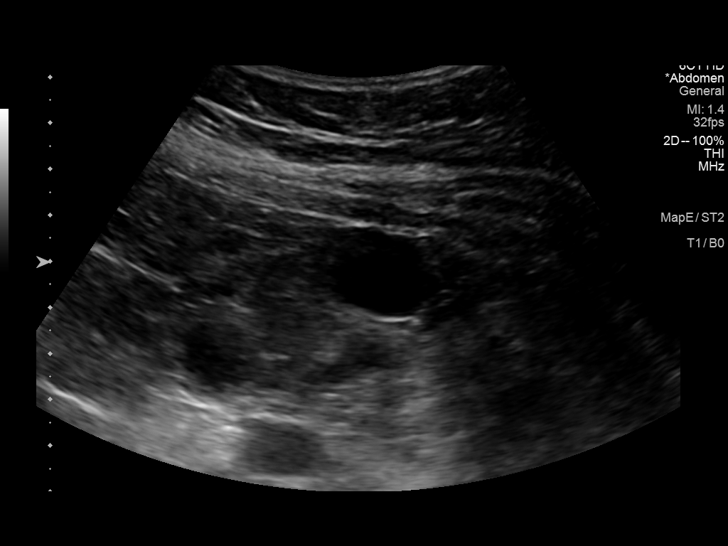
[im 15/40]
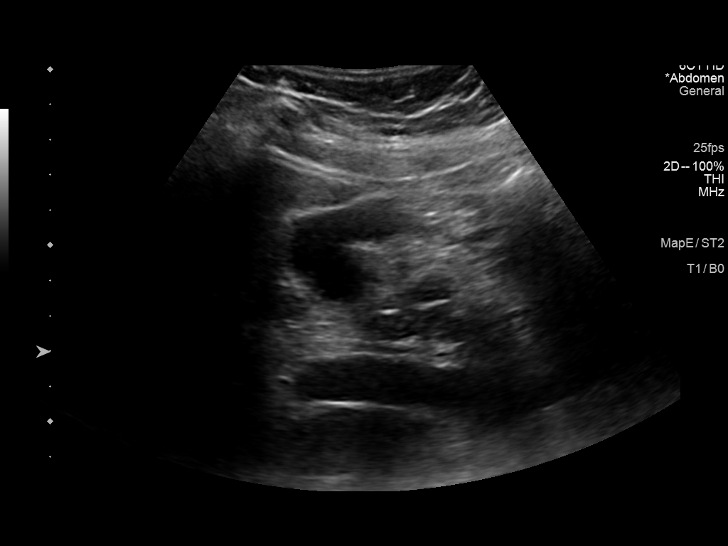
[im 18/40]
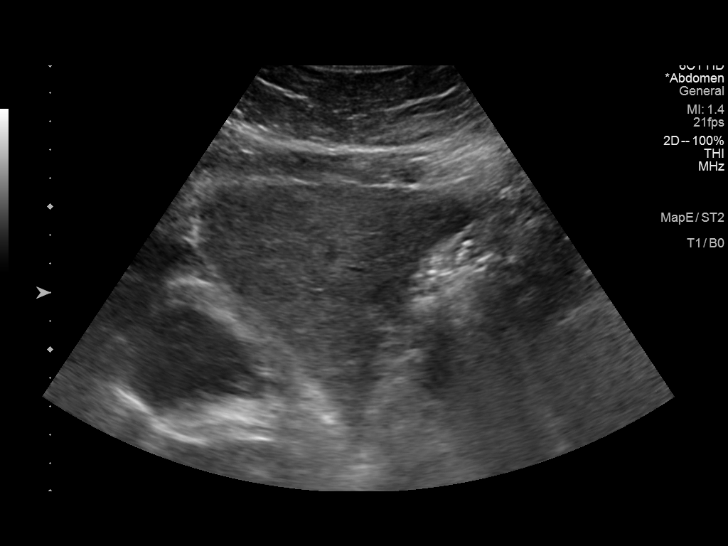
[im 22/40]
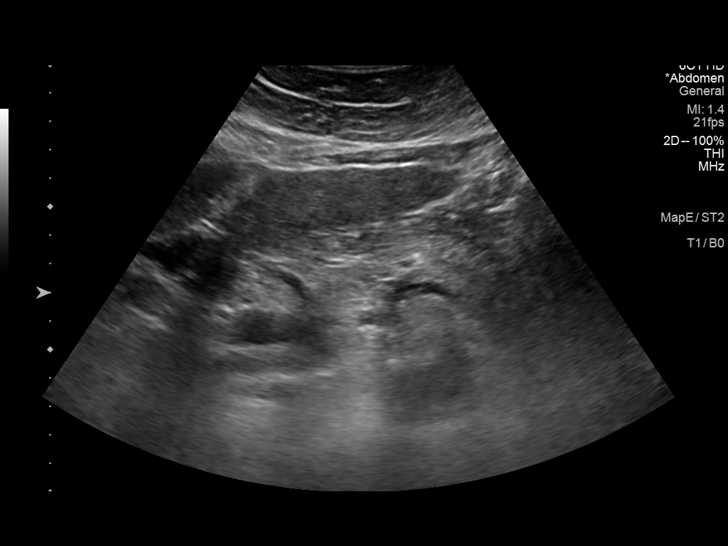
[im 25/40]
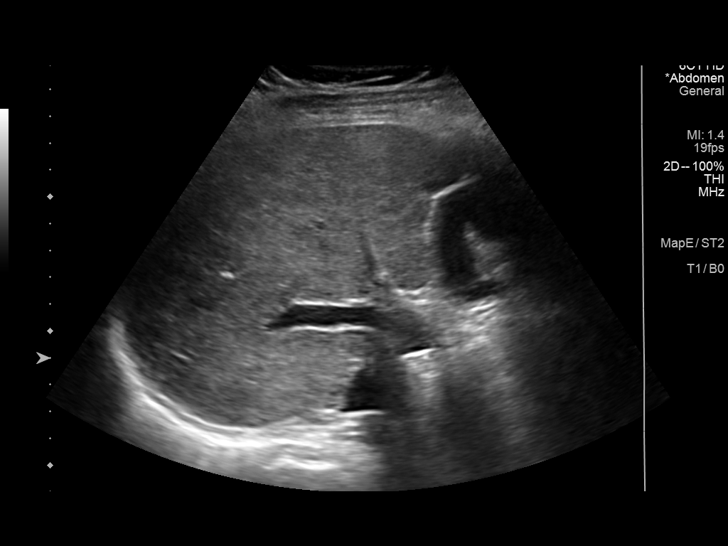
[im 27/40]
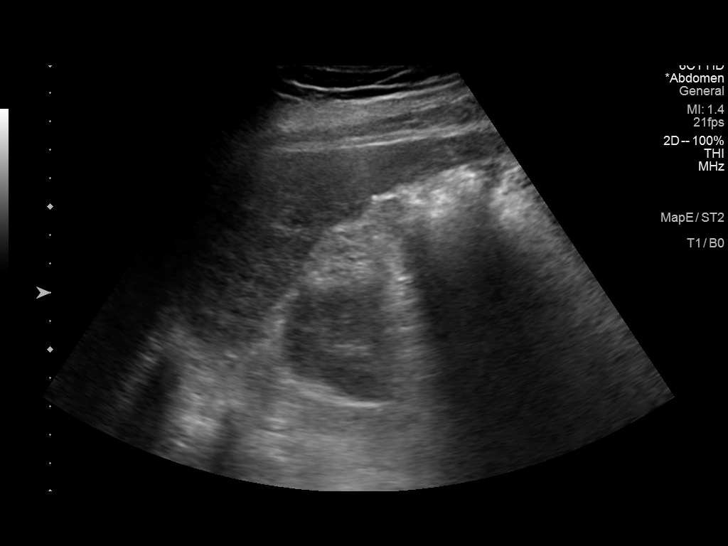
[im 30/40]
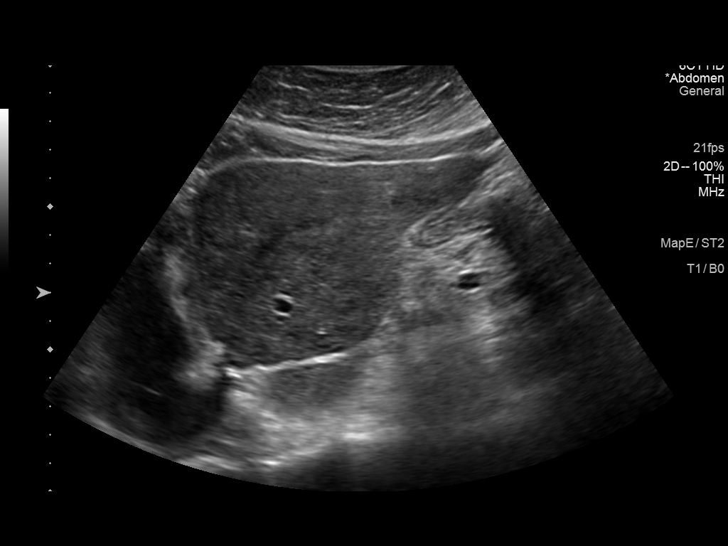
[im 33/40]
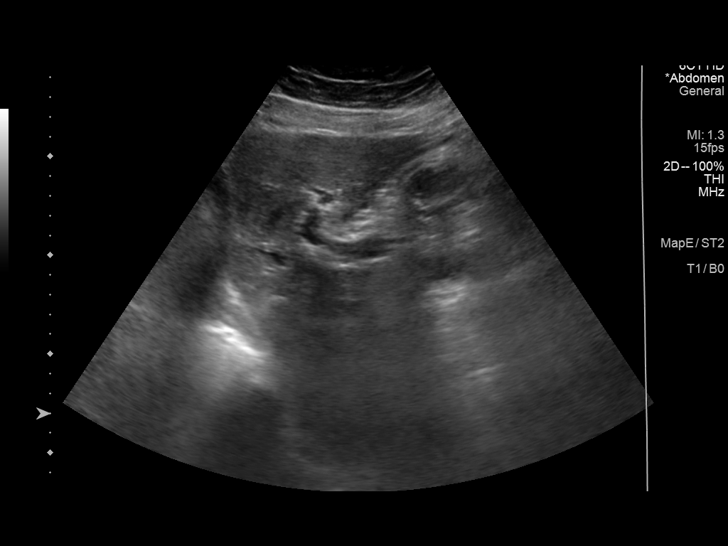
[im 36/40]
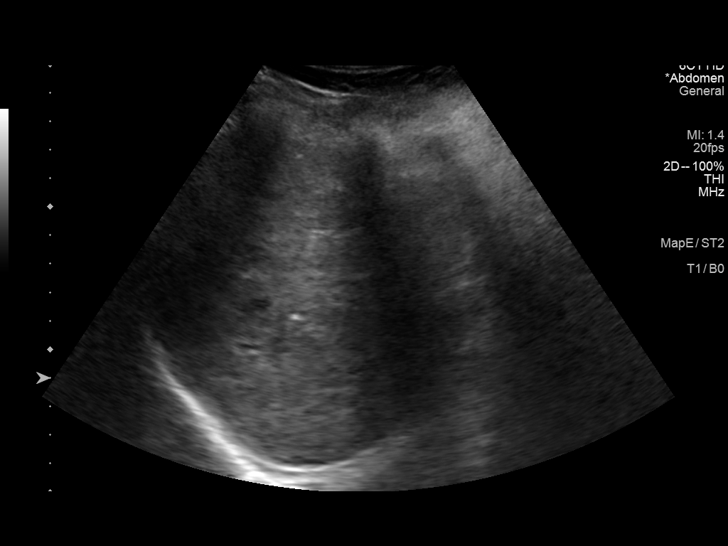
[im 40/40]
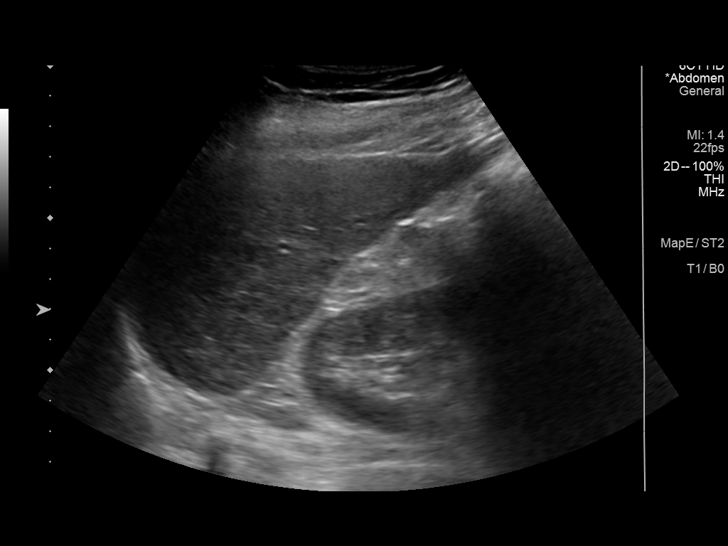

[14 of 25 positions shown; findings below may reference images not displayed]

FINDINGS: Gallbladder:

No gallstones or wall thickening visualized. No sonographic Murphy
sign noted by sonographer.

Common bile duct:

Diameter: 3 mm

Liver:

Heterogeneous increased liver echotexture consistent with history of
cirrhosis. No focal parenchymal abnormalities. No intrahepatic duct
dilation. Portal vein is patent on color Doppler imaging with normal
direction of blood flow towards the liver.

Other: None.
IMPRESSION: 1. Stable cirrhosis.  No focal parenchymal liver abnormality.

## 2023-07-06 DIAGNOSIS — Z23 Encounter for immunization: Secondary | ICD-10-CM | POA: Diagnosis not present

## 2023-07-06 DIAGNOSIS — Z0000 Encounter for general adult medical examination without abnormal findings: Secondary | ICD-10-CM | POA: Diagnosis not present

## 2023-07-06 DIAGNOSIS — I700 Atherosclerosis of aorta: Secondary | ICD-10-CM | POA: Diagnosis not present

## 2023-07-06 DIAGNOSIS — R7303 Prediabetes: Secondary | ICD-10-CM | POA: Diagnosis not present

## 2023-07-06 DIAGNOSIS — R911 Solitary pulmonary nodule: Secondary | ICD-10-CM | POA: Diagnosis not present

## 2023-07-06 DIAGNOSIS — Z8551 Personal history of malignant neoplasm of bladder: Secondary | ICD-10-CM | POA: Diagnosis not present

## 2023-07-06 DIAGNOSIS — J439 Emphysema, unspecified: Secondary | ICD-10-CM | POA: Diagnosis not present

## 2023-07-06 DIAGNOSIS — I10 Essential (primary) hypertension: Secondary | ICD-10-CM | POA: Diagnosis not present

## 2023-07-06 DIAGNOSIS — K7460 Unspecified cirrhosis of liver: Secondary | ICD-10-CM | POA: Diagnosis not present

## 2023-07-06 DIAGNOSIS — Z86711 Personal history of pulmonary embolism: Secondary | ICD-10-CM | POA: Diagnosis not present

## 2023-07-06 DIAGNOSIS — I2510 Atherosclerotic heart disease of native coronary artery without angina pectoris: Secondary | ICD-10-CM | POA: Diagnosis not present

## 2023-07-25 ENCOUNTER — Ambulatory Visit
Admission: RE | Admit: 2023-07-25 | Discharge: 2023-07-25 | Disposition: A | Discharge status: Home / Self Care | Payer: Medicare Other | Source: Ambulatory Visit | Attending: Pulmonary Disease | Admitting: Pulmonary Disease

## 2023-07-25 DIAGNOSIS — R599 Enlarged lymph nodes, unspecified: Secondary | ICD-10-CM | POA: Diagnosis not present

## 2023-07-25 DIAGNOSIS — R911 Solitary pulmonary nodule: Secondary | ICD-10-CM | POA: Diagnosis not present

## 2023-07-25 DIAGNOSIS — I2510 Atherosclerotic heart disease of native coronary artery without angina pectoris: Secondary | ICD-10-CM | POA: Diagnosis not present

## 2023-07-25 DIAGNOSIS — J439 Emphysema, unspecified: Secondary | ICD-10-CM | POA: Diagnosis not present

## 2023-07-26 DIAGNOSIS — C678 Malignant neoplasm of overlapping sites of bladder: Secondary | ICD-10-CM | POA: Diagnosis not present

## 2023-07-26 DIAGNOSIS — R3912 Poor urinary stream: Secondary | ICD-10-CM | POA: Diagnosis not present

## 2023-08-03 ENCOUNTER — Other Ambulatory Visit: Payer: Self-pay | Admitting: Emergency Medicine

## 2023-08-03 ENCOUNTER — Ambulatory Visit (INDEPENDENT_AMBULATORY_CARE_PROVIDER_SITE_OTHER): Payer: Medicare Other | Admitting: Pulmonary Disease

## 2023-08-03 ENCOUNTER — Encounter: Payer: Self-pay | Admitting: Pulmonary Disease

## 2023-08-03 VITALS — BP 173/76 | HR 98 | Ht 74.0 in | Wt 229.0 lb

## 2023-08-03 DIAGNOSIS — J449 Chronic obstructive pulmonary disease, unspecified: Secondary | ICD-10-CM

## 2023-08-03 DIAGNOSIS — J9601 Acute respiratory failure with hypoxia: Secondary | ICD-10-CM | POA: Diagnosis not present

## 2023-08-03 DIAGNOSIS — F17210 Nicotine dependence, cigarettes, uncomplicated: Secondary | ICD-10-CM | POA: Diagnosis not present

## 2023-08-03 DIAGNOSIS — R911 Solitary pulmonary nodule: Secondary | ICD-10-CM

## 2023-08-03 NOTE — Progress Notes (Signed)
 @Patient  ID: Wyatt Berry, male    DOB: 1951/04/24, 73 y.o.   MRN: [id number]  Chief Complaint  Patient presents with   Follow-up    Referring provider: Georgann Housekeeper, MD  HPI:   73 y.o. man whom are seeing for follow up of pulmonary nodule discovered on lung cancer screening CT scan 01/2022.  Overall, doing well.  Denies any dyspnea.  Repeat CT scan at 8-month interval performed earlier this month.  This is stable to slightly enlarged in terms of concerning RUL mass like lesion on my review interpretation. LLL nodule not previously PET avid stable on my review and interpretation.  Radiology interpretation not resulted, performed 10 days prior.  We discussed concern for malignancy given PET avidity on prior PET scan.  It was biopsied and nondiagnostic.  Discussed concern for cancer and offered repeat biopsy.  If any interval change for the worse or grow any but a little bit with sided we would move forward with biopsy, if stable we will continue surveillance.  HPI at initial visit: Patient usual state of health.  After discussion with PCP underwent lung cancer screening.  This was performed 01/2022.  On my review interpretation there is a area of left upper lobe nodularity/scarring, left lower lobe greater than 1 cm rounded solid lesion and additional right middle lobe rounded solid lesion about 1 cm.  This prompted referral.  He gets a little short of breath exertion at times.  Nothing out of the ordinary.  Minimal cough.  Overall feels fine.  Has decreased his cigarette intake since this finding.  Down to 5 cigarettes a day from half a pack a day prior to the CT scan.  We discussed at length the CT findings and concern for possible malignancy.  The 2 distinct nodules to be a bit atypical but possible for synchronous primaries versus 2 distinct pathologies.  Discussed additional imaging via PET scan would be helpful in terms of deciding neck steps and he agrees to this.  PMH: Tobacco  abuse, hypertension Surgical history: Past Surgical History:  Procedure Laterality Date   BRONCHIAL BIOPSY  03/22/2022   Procedure: BRONCHIAL BIOPSIES;  Surgeon: Josephine Igo, DO;  Location: [facility];  Service: Pulmonary;;   BRONCHIAL NEEDLE ASPIRATION BIOPSY  03/22/2022   Procedure: BRONCHIAL NEEDLE ASPIRATION BIOPSIES;  Surgeon: Josephine Igo, DO;  Location: [facility];  Service: Pulmonary;;   COLONOSCOPY     CYSTOSCOPY W/ RETROGRADES Bilateral 06/14/2022   Procedure: CYSTOSCOPY WITH RETROGRADE PYELOGRAM;  Surgeon: [name], MD;  Location: [facility];  Service: Urology;  Laterality: Bilateral;   CYSTOSCOPY W/ RETROGRADES Bilateral 10/11/2022   Procedure: CYSTOSCOPY WITH RETROGRADE PYELOGRAM;  Surgeon: [name], MD;  Location: [facility];  Service: Urology;  Laterality: Bilateral;   TONSILLECTOMY     TRANSURETHRAL RESECTION OF BLADDER TUMOR N/A 06/14/2022   Procedure: TRANSURETHRAL RESECTION OF BLADDER TUMOR (TURBT), BLADDER BIOPSY;  Surgeon: [name], MD;  Location: [facility];  Service: Urology;  Laterality: N/A;  45 MINS   TRANSURETHRAL RESECTION OF BLADDER TUMOR N/A 10/11/2022   Procedure: TRANSURETHRAL RESECTION OF BLADDER TUMOR (TURBT);  Surgeon: [name], MD;  Location: [facility];  Service: Urology;  Laterality: N/A;  45 MINS   VIDEO BRONCHOSCOPY WITH ENDOBRONCHIAL ULTRASOUND Bilateral 03/22/2022   Procedure: VIDEO BRONCHOSCOPY WITH ENDOBRONCHIAL ULTRASOUND;  Surgeon: Josephine Igo, DO;  Location: [facility];  Service: Pulmonary;  Laterality: Bilateral;   Family history: Mother with diabetes, CHF, brother with CAD  Social history: Current smoker, down to a few cigarettes a day, historically half a pack per day, lives in [place] / Pulmonary Flowsheets:   ACT:      No data to display          MMRC:     No data to display          Epworth:      No data to display          Tests:   FENO:  No results found  for: "NITRICOXIDE"  PFT:    Latest Ref Rng & Units 04/05/2022   11:32 AM  PFT Results  FVC-Pre L 3.37   FVC-Predicted Pre % 68   FVC-Post L 3.39   FVC-Predicted Post % 68   Pre FEV1/FVC % % 64   Post FEV1/FCV % % 65   FEV1-Pre L 2.16   FEV1-Predicted Pre % 59   FEV1-Post L 2.20   DLCO uncorrected ml/min/mmHg 19.62   DLCO UNC% % 69   DLCO corrected ml/min/mmHg 18.91   DLCO COR %Predicted % 66   DLVA Predicted % 80   TLC L 5.65   TLC % Predicted % 73   RV % Predicted % 77   Personally reviewed and interpreted as moderate fixed obstruction, no bronchodilator response, lung volumes within normal limits, moderately reduced DLCO  WALK:      No data to display          Imaging: Personally reviewed and as per EMR discussion this note No results found.  Lab Results: Personally reviewed CBC    Component Value Date/Time   WBC 4.9 10/10/2022 1341   RBC 5.09 10/10/2022 1341   HGB 15.1 10/10/2022 1341   HCT 46.3 10/10/2022 1341   PLT 224 10/10/2022 1341   MCV 91.0 10/10/2022 1341   MCH 29.7 10/10/2022 1341   MCHC 32.6 10/10/2022 1341   RDW 15.1 10/10/2022 1341   LYMPHSABS 2.4 09/18/2020 1008   MONOABS 0.4 09/18/2020 1008   EOSABS 0.0 09/18/2020 1008   BASOSABS 0.0 09/18/2020 1008    BMET    Component Value Date/Time   NA 136 10/10/2022 1341   K 3.8 10/10/2022 1341   CL 104 10/10/2022 1341   CO2 22 10/10/2022 1341   GLUCOSE 95 10/10/2022 1341   BUN 11 10/10/2022 1341   CREATININE 1.08 10/10/2022 1341   CALCIUM 8.9 10/10/2022 1341   GFRNONAA >60 10/10/2022 1341   GFRAA >60 05/03/2016 0224    BNP    Component Value Date/Time   BNP 74.6 05/01/2016 2304    ProBNP No results found for: "PROBNP"  Specialty Problems       Pulmonary Problems   Acute respiratory failure with hypoxia (HCC)   Lung nodule    No Known Allergies  Immunization History  Administered Date(s) Administered   Fluad Quad(high Dose 65+) 02/24/2019   Influenza, High Dose  Seasonal PF 04/05/2018   PFIZER(Purple Top)SARS-COV-2 Vaccination 06/29/2019, 07/20/2019   Pneumococcal Conjugate-13 06/08/2017   Pneumococcal Polysaccharide-23 02/21/2013   Tdap 02/21/2013   Zoster Recombinant(Shingrix) 06/16/2018, 03/11/2019   Zoster, Live 10/28/2014, 06/16/2018, 03/11/2019    Past Medical History:  Diagnosis Date   AAA (abdominal aortic aneurysm) without rupture (HCC) 01/01/2021   Cancer (HCC)    bladder   Cirrhosis of liver (HCC)    COPD (chronic obstructive pulmonary disease) (HCC)    Per patient mild   DVT (deep venous thrombosis) (HCC) 05/02/2016   subacute LLE DVT 05/02/16  Hepatitis C    HCV, s/p treatment   HLD (hyperlipidemia)    Hypertension    Lung nodule 02/23/2022   left lung   PE (pulmonary thromboembolism) (HCC) 05/02/2016   bilateral at least submassive PE with right heart strain, s/p Xarelto x 4 years   Pre-diabetes 12/2020   no meds    Tobacco History: Social History   Tobacco Use  Smoking Status Some Days   Current packs/day: 0.25   Average packs/day: 0.3 packs/day for 52.2 years (13.0 ttl pk-yrs)   Types: Cigarettes   Start date: 1973  Smokeless Tobacco Never  Tobacco Comments   Pt states that he smokes less than 5 cigarettes a day. 04/05/22 ALS    Ready to quit: Not Answered Counseling given: Not Answered Tobacco comments: Pt states that he smokes less than 5 cigarettes a day. 04/05/22 ALS      Outpatient Encounter Medications as of 08/03/2023  Medication Sig   amLODipine (NORVASC) 5 MG tablet Take 5 mg by mouth daily.   aspirin EC 81 MG tablet Take 81 mg by mouth daily.   atorvastatin (LIPITOR) 10 MG tablet Take 10 mg by mouth daily.   losartan-hydrochlorothiazide (HYZAAR) 100-12.5 MG per tablet Take 1 tablet by mouth daily.   metoprolol succinate (TOPROL-XL) 25 MG 24 hr tablet Take 25 mg by mouth at bedtime.   Multiple Vitamin (MULTIVITAMIN) tablet Take 1 tablet by mouth daily.   tamsulosin (FLOMAX) 0.4 MG CAPS  capsule Take 0.4 mg by mouth at bedtime.   No facility-administered encounter medications on file as of 08/03/2023.     Review of Systems  Review of Systems  N/a Physical Exam  BP (!) 173/76 (BP Location: Left Arm, Patient Position: Sitting, Cuff Size: Large)   Pulse 98   Ht 6\' 2"  (1.88 m)   Wt 229 lb (103.9 kg)   SpO2 93%   BMI 29.40 kg/m   Wt Readings from Last 5 Encounters:  08/03/23 229 lb (103.9 kg)  02/13/23 230 lb 6.4 oz (104.5 kg)  10/11/22 237 lb (107.5 kg)  10/10/22 237 lb (107.5 kg)  06/14/22 237 lb 0.6 oz (107.5 kg)    BMI Readings from Last 5 Encounters:  08/03/23 29.40 kg/m  02/13/23 29.58 kg/m  10/11/22 31.27 kg/m  10/10/22 31.27 kg/m  06/14/22 31.27 kg/m     Physical Exam General: Sitting in chair, no acute distress Eyes: EOMI, no icterus Neck: Supple, no JVP Pulmonary: Clear, normal work of breathing Cardiovascular: Warm, no edema Abdomen: Nondistended, sounds present MSK: No synovitis, no joint effusion Neuro: Normal gait, no weakness Psych: Normal mood, full affect   Assessment & Plan:   Lung nodules: Rounded left lower lobe, right lower lobe as well as left upper lobe fibrotic appearing lesion. LLL not PET AVID, RML is PET avid. S/p robotic navigational bronchoscopy with out evidence of malignancy.  Given PET avidity and size, high suspicion for malignancy overall.  Fortunately, stable on CT scans March 2024, September 2024.  Stable to slight increase on repeat scan 07/2023, radiology report not back.  If felt stable via radiology we will continue a 25-month follow-up scan, will continue 6 months until 2 years of stability and consider 1 year follow-up thereafter.  If there is concern for interval increase in size similar to my concern, we discussed and agreed to move forward with biopsy.  Will relay information to patient once radiology report is back.  COPD: Gold B.  Relatively minimal symptoms. No inhalers. Moderate reduction in  FEV1, 59%.   If thoracic surgery evaluation is needed, may benefit for additional testing prior to surgery if indicated.   No follow-ups on file.   Karren Burly, MD 08/03/2023

## 2023-08-03 NOTE — Progress Notes (Signed)
 Orders placed to arrange robotic assisted navigational bronchoscopy on 3/18

## 2023-08-03 NOTE — H&P (View-Only) (Signed)
 @Patient  ID: Wyatt Berry, male    DOB: 1951/04/24, 73 y.o.   MRN: [id number]  Chief Complaint  Patient presents with   Follow-up    Referring provider: Georgann Housekeeper, MD  HPI:   73 y.o. man whom are seeing for follow up of pulmonary nodule discovered on lung cancer screening CT scan 01/2022.  Overall, doing well.  Denies any dyspnea.  Repeat CT scan at 8-month interval performed earlier this month.  This is stable to slightly enlarged in terms of concerning RUL mass like lesion on my review interpretation. LLL nodule not previously PET avid stable on my review and interpretation.  Radiology interpretation not resulted, performed 10 days prior.  We discussed concern for malignancy given PET avidity on prior PET scan.  It was biopsied and nondiagnostic.  Discussed concern for cancer and offered repeat biopsy.  If any interval change for the worse or grow any but a little bit with sided we would move forward with biopsy, if stable we will continue surveillance.  HPI at initial visit: Patient usual state of health.  After discussion with PCP underwent lung cancer screening.  This was performed 01/2022.  On my review interpretation there is a area of left upper lobe nodularity/scarring, left lower lobe greater than 1 cm rounded solid lesion and additional right middle lobe rounded solid lesion about 1 cm.  This prompted referral.  He gets a little short of breath exertion at times.  Nothing out of the ordinary.  Minimal cough.  Overall feels fine.  Has decreased his cigarette intake since this finding.  Down to 5 cigarettes a day from half a pack a day prior to the CT scan.  We discussed at length the CT findings and concern for possible malignancy.  The 2 distinct nodules to be a bit atypical but possible for synchronous primaries versus 2 distinct pathologies.  Discussed additional imaging via PET scan would be helpful in terms of deciding neck steps and he agrees to this.  PMH: Tobacco  abuse, hypertension Surgical history: Past Surgical History:  Procedure Laterality Date   BRONCHIAL BIOPSY  03/22/2022   Procedure: BRONCHIAL BIOPSIES;  Surgeon: Josephine Igo, DO;  Location: [facility];  Service: Pulmonary;;   BRONCHIAL NEEDLE ASPIRATION BIOPSY  03/22/2022   Procedure: BRONCHIAL NEEDLE ASPIRATION BIOPSIES;  Surgeon: Josephine Igo, DO;  Location: [facility];  Service: Pulmonary;;   COLONOSCOPY     CYSTOSCOPY W/ RETROGRADES Bilateral 06/14/2022   Procedure: CYSTOSCOPY WITH RETROGRADE PYELOGRAM;  Surgeon: [name], MD;  Location: [facility];  Service: Urology;  Laterality: Bilateral;   CYSTOSCOPY W/ RETROGRADES Bilateral 10/11/2022   Procedure: CYSTOSCOPY WITH RETROGRADE PYELOGRAM;  Surgeon: [name], MD;  Location: [facility];  Service: Urology;  Laterality: Bilateral;   TONSILLECTOMY     TRANSURETHRAL RESECTION OF BLADDER TUMOR N/A 06/14/2022   Procedure: TRANSURETHRAL RESECTION OF BLADDER TUMOR (TURBT), BLADDER BIOPSY;  Surgeon: [name], MD;  Location: [facility];  Service: Urology;  Laterality: N/A;  45 MINS   TRANSURETHRAL RESECTION OF BLADDER TUMOR N/A 10/11/2022   Procedure: TRANSURETHRAL RESECTION OF BLADDER TUMOR (TURBT);  Surgeon: [name], MD;  Location: [facility];  Service: Urology;  Laterality: N/A;  45 MINS   VIDEO BRONCHOSCOPY WITH ENDOBRONCHIAL ULTRASOUND Bilateral 03/22/2022   Procedure: VIDEO BRONCHOSCOPY WITH ENDOBRONCHIAL ULTRASOUND;  Surgeon: Josephine Igo, DO;  Location: [facility];  Service: Pulmonary;  Laterality: Bilateral;   Family history: Mother with diabetes, CHF, brother with CAD  Social history: Current smoker, down to a few cigarettes a day, historically half a pack per day, lives in [place] / Pulmonary Flowsheets:   ACT:      No data to display          MMRC:     No data to display          Epworth:      No data to display          Tests:   FENO:  No results found  for: "NITRICOXIDE"  PFT:    Latest Ref Rng & Units 04/05/2022   11:32 AM  PFT Results  FVC-Pre L 3.37   FVC-Predicted Pre % 68   FVC-Post L 3.39   FVC-Predicted Post % 68   Pre FEV1/FVC % % 64   Post FEV1/FCV % % 65   FEV1-Pre L 2.16   FEV1-Predicted Pre % 59   FEV1-Post L 2.20   DLCO uncorrected ml/min/mmHg 19.62   DLCO UNC% % 69   DLCO corrected ml/min/mmHg 18.91   DLCO COR %Predicted % 66   DLVA Predicted % 80   TLC L 5.65   TLC % Predicted % 73   RV % Predicted % 77   Personally reviewed and interpreted as moderate fixed obstruction, no bronchodilator response, lung volumes within normal limits, moderately reduced DLCO  WALK:      No data to display          Imaging: Personally reviewed and as per EMR discussion this note No results found.  Lab Results: Personally reviewed CBC    Component Value Date/Time   WBC 4.9 10/10/2022 1341   RBC 5.09 10/10/2022 1341   HGB 15.1 10/10/2022 1341   HCT 46.3 10/10/2022 1341   PLT 224 10/10/2022 1341   MCV 91.0 10/10/2022 1341   MCH 29.7 10/10/2022 1341   MCHC 32.6 10/10/2022 1341   RDW 15.1 10/10/2022 1341   LYMPHSABS 2.4 09/18/2020 1008   MONOABS 0.4 09/18/2020 1008   EOSABS 0.0 09/18/2020 1008   BASOSABS 0.0 09/18/2020 1008    BMET    Component Value Date/Time   NA 136 10/10/2022 1341   K 3.8 10/10/2022 1341   CL 104 10/10/2022 1341   CO2 22 10/10/2022 1341   GLUCOSE 95 10/10/2022 1341   BUN 11 10/10/2022 1341   CREATININE 1.08 10/10/2022 1341   CALCIUM 8.9 10/10/2022 1341   GFRNONAA >60 10/10/2022 1341   GFRAA >60 05/03/2016 0224    BNP    Component Value Date/Time   BNP 74.6 05/01/2016 2304    ProBNP No results found for: "PROBNP"  Specialty Problems       Pulmonary Problems   Acute respiratory failure with hypoxia (HCC)   Lung nodule    No Known Allergies  Immunization History  Administered Date(s) Administered   Fluad Quad(high Dose 65+) 02/24/2019   Influenza, High Dose  Seasonal PF 04/05/2018   PFIZER(Purple Top)SARS-COV-2 Vaccination 06/29/2019, 07/20/2019   Pneumococcal Conjugate-13 06/08/2017   Pneumococcal Polysaccharide-23 02/21/2013   Tdap 02/21/2013   Zoster Recombinant(Shingrix) 06/16/2018, 03/11/2019   Zoster, Live 10/28/2014, 06/16/2018, 03/11/2019    Past Medical History:  Diagnosis Date   AAA (abdominal aortic aneurysm) without rupture (HCC) 01/01/2021   Cancer (HCC)    bladder   Cirrhosis of liver (HCC)    COPD (chronic obstructive pulmonary disease) (HCC)    Per patient mild   DVT (deep venous thrombosis) (HCC) 05/02/2016   subacute LLE DVT 05/02/16  Hepatitis C    HCV, s/p treatment   HLD (hyperlipidemia)    Hypertension    Lung nodule 02/23/2022   left lung   PE (pulmonary thromboembolism) (HCC) 05/02/2016   bilateral at least submassive PE with right heart strain, s/p Xarelto x 4 years   Pre-diabetes 12/2020   no meds    Tobacco History: Social History   Tobacco Use  Smoking Status Some Days   Current packs/day: 0.25   Average packs/day: 0.3 packs/day for 52.2 years (13.0 ttl pk-yrs)   Types: Cigarettes   Start date: 1973  Smokeless Tobacco Never  Tobacco Comments   Pt states that he smokes less than 5 cigarettes a day. 04/05/22 ALS    Ready to quit: Not Answered Counseling given: Not Answered Tobacco comments: Pt states that he smokes less than 5 cigarettes a day. 04/05/22 ALS      Outpatient Encounter Medications as of 08/03/2023  Medication Sig   amLODipine (NORVASC) 5 MG tablet Take 5 mg by mouth daily.   aspirin EC 81 MG tablet Take 81 mg by mouth daily.   atorvastatin (LIPITOR) 10 MG tablet Take 10 mg by mouth daily.   losartan-hydrochlorothiazide (HYZAAR) 100-12.5 MG per tablet Take 1 tablet by mouth daily.   metoprolol succinate (TOPROL-XL) 25 MG 24 hr tablet Take 25 mg by mouth at bedtime.   Multiple Vitamin (MULTIVITAMIN) tablet Take 1 tablet by mouth daily.   tamsulosin (FLOMAX) 0.4 MG CAPS  capsule Take 0.4 mg by mouth at bedtime.   No facility-administered encounter medications on file as of 08/03/2023.     Review of Systems  Review of Systems  N/a Physical Exam  BP (!) 173/76 (BP Location: Left Arm, Patient Position: Sitting, Cuff Size: Large)   Pulse 98   Ht 6\' 2"  (1.88 m)   Wt 229 lb (103.9 kg)   SpO2 93%   BMI 29.40 kg/m   Wt Readings from Last 5 Encounters:  08/03/23 229 lb (103.9 kg)  02/13/23 230 lb 6.4 oz (104.5 kg)  10/11/22 237 lb (107.5 kg)  10/10/22 237 lb (107.5 kg)  06/14/22 237 lb 0.6 oz (107.5 kg)    BMI Readings from Last 5 Encounters:  08/03/23 29.40 kg/m  02/13/23 29.58 kg/m  10/11/22 31.27 kg/m  10/10/22 31.27 kg/m  06/14/22 31.27 kg/m     Physical Exam General: Sitting in chair, no acute distress Eyes: EOMI, no icterus Neck: Supple, no JVP Pulmonary: Clear, normal work of breathing Cardiovascular: Warm, no edema Abdomen: Nondistended, sounds present MSK: No synovitis, no joint effusion Neuro: Normal gait, no weakness Psych: Normal mood, full affect   Assessment & Plan:   Lung nodules: Rounded left lower lobe, right lower lobe as well as left upper lobe fibrotic appearing lesion. LLL not PET AVID, RML is PET avid. S/p robotic navigational bronchoscopy with out evidence of malignancy.  Given PET avidity and size, high suspicion for malignancy overall.  Fortunately, stable on CT scans March 2024, September 2024.  Stable to slight increase on repeat scan 07/2023, radiology report not back.  If felt stable via radiology we will continue a 25-month follow-up scan, will continue 6 months until 2 years of stability and consider 1 year follow-up thereafter.  If there is concern for interval increase in size similar to my concern, we discussed and agreed to move forward with biopsy.  Will relay information to patient once radiology report is back.  COPD: Gold B.  Relatively minimal symptoms. No inhalers. Moderate reduction in  FEV1, 59%.   If thoracic surgery evaluation is needed, may benefit for additional testing prior to surgery if indicated.   No follow-ups on file.   Karren Burly, MD 08/03/2023

## 2023-08-04 ENCOUNTER — Encounter: Payer: Self-pay | Admitting: Emergency Medicine

## 2023-08-04 ENCOUNTER — Other Ambulatory Visit: Payer: Self-pay

## 2023-08-04 ENCOUNTER — Encounter (HOSPITAL_COMMUNITY): Payer: Self-pay | Admitting: Emergency Medicine

## 2023-08-04 NOTE — Progress Notes (Signed)
 PCP - Dr Georgann Housekeeper Cardiologist - none Pulmonology - Dr Vilma Meckel  CT Super Chest x-ray - 07/25/23 EKG - DOS-08/08/23 Stress Test - n/a ECHO - 05/02/16 Cardiac Cath - n/a  ICD Pacemaker/Loop - n/a  Sleep Study -  n/a  Diabetes - n/a  Blood Thinner Instructions:  n/a  Aspirin Instructions: Last dose of ASA will be 08/05/23 per patient.  NPO   Anesthesia review: Yes   STOP now taking any Aspirin (unless otherwise instructed by your surgeon), Aleve, Naproxen, Ibuprofen, Motrin, Advil, Goody's, BC's, all herbal medications, fish oil, and all vitamins.   Coronavirus Screening Do you have any of the following symptoms:  Cough yes/no: No Fever (>100.27F)  yes/no: No Runny nose yes/no: No Sore throat yes/no: No Difficulty breathing/shortness of breath  yes/no: No  Have you traveled in the last 14 days and where? yes/no: No  Patient verbalized understanding of instructions that were given via phone.

## 2023-08-07 ENCOUNTER — Encounter (HOSPITAL_COMMUNITY): Payer: Self-pay | Admitting: Emergency Medicine

## 2023-08-08 ENCOUNTER — Ambulatory Visit (HOSPITAL_COMMUNITY)

## 2023-08-08 ENCOUNTER — Ambulatory Visit (HOSPITAL_COMMUNITY): Admitting: Physician Assistant

## 2023-08-08 ENCOUNTER — Ambulatory Visit (HOSPITAL_COMMUNITY)
Admission: RE | Admit: 2023-08-08 | Discharge: 2023-08-08 | Disposition: A | Discharge status: Home / Self Care | Attending: Emergency Medicine | Admitting: Emergency Medicine

## 2023-08-08 ENCOUNTER — Encounter (HOSPITAL_COMMUNITY)
Admission: RE | Disposition: A | Discharge status: Home / Self Care | Payer: Self-pay | Source: Home / Self Care | Attending: Emergency Medicine

## 2023-08-08 ENCOUNTER — Encounter (HOSPITAL_COMMUNITY): Payer: Self-pay | Admitting: Emergency Medicine

## 2023-08-08 ENCOUNTER — Other Ambulatory Visit: Payer: Self-pay

## 2023-08-08 ENCOUNTER — Ambulatory Visit (HOSPITAL_BASED_OUTPATIENT_CLINIC_OR_DEPARTMENT_OTHER): Admitting: Physician Assistant

## 2023-08-08 DIAGNOSIS — R918 Other nonspecific abnormal finding of lung field: Secondary | ICD-10-CM | POA: Diagnosis not present

## 2023-08-08 DIAGNOSIS — J449 Chronic obstructive pulmonary disease, unspecified: Secondary | ICD-10-CM | POA: Insufficient documentation

## 2023-08-08 DIAGNOSIS — Z86711 Personal history of pulmonary embolism: Secondary | ICD-10-CM | POA: Diagnosis not present

## 2023-08-08 DIAGNOSIS — Z86718 Personal history of other venous thrombosis and embolism: Secondary | ICD-10-CM | POA: Insufficient documentation

## 2023-08-08 DIAGNOSIS — I10 Essential (primary) hypertension: Secondary | ICD-10-CM

## 2023-08-08 DIAGNOSIS — Z48813 Encounter for surgical aftercare following surgery on the respiratory system: Secondary | ICD-10-CM | POA: Diagnosis not present

## 2023-08-08 DIAGNOSIS — F17210 Nicotine dependence, cigarettes, uncomplicated: Secondary | ICD-10-CM | POA: Insufficient documentation

## 2023-08-08 DIAGNOSIS — C342 Malignant neoplasm of middle lobe, bronchus or lung: Secondary | ICD-10-CM | POA: Insufficient documentation

## 2023-08-08 DIAGNOSIS — Z79899 Other long term (current) drug therapy: Secondary | ICD-10-CM | POA: Insufficient documentation

## 2023-08-08 DIAGNOSIS — R911 Solitary pulmonary nodule: Secondary | ICD-10-CM | POA: Diagnosis not present

## 2023-08-08 HISTORY — PX: BRONCHIAL BIOPSY: SHX5109

## 2023-08-08 HISTORY — PX: BRONCHIAL NEEDLE ASPIRATION BIOPSY: SHX5106

## 2023-08-08 HISTORY — PX: VIDEO BRONCHOSCOPY WITH ENDOBRONCHIAL NAVIGATION: SHX6175

## 2023-08-08 HISTORY — PX: BRONCHIAL BRUSHINGS: SHX5108

## 2023-08-08 LAB — COMPREHENSIVE METABOLIC PANEL
ALT: 20 U/L (ref 0–44)
AST: 31 U/L (ref 15–41)
Albumin: 3.8 g/dL (ref 3.5–5.0)
Alkaline Phosphatase: 47 U/L (ref 38–126)
Anion gap: 14 (ref 5–15)
BUN: 10 mg/dL (ref 8–23)
CO2: 21 mmol/L — ABNORMAL LOW (ref 22–32)
Calcium: 9.4 mg/dL (ref 8.9–10.3)
Chloride: 102 mmol/L (ref 98–111)
Creatinine, Ser: 1.12 mg/dL (ref 0.61–1.24)
GFR, Estimated: >60 mL/min (ref >60)
Glucose, Bld: 96 mg/dL (ref 70–99)
Potassium: 4.1 mmol/L (ref 3.5–5.1)
Sodium: 137 mmol/L (ref 135–145)
Total Bilirubin: 0.8 mg/dL (ref 0.0–1.2)
Total Protein: 7.1 g/dL (ref 6.5–8.1)

## 2023-08-08 LAB — CBC
HCT: 46.6 % (ref 39.0–52.0)
Hemoglobin: 15.9 g/dL (ref 13.0–17.0)
MCH: 30.6 pg (ref 26.0–34.0)
MCHC: 34.1 g/dL (ref 30.0–36.0)
MCV: 89.8 fL (ref 80.0–100.0)
Platelets: 220 10*3/uL (ref 150–400)
RBC: 5.19 MIL/uL (ref 4.22–5.81)
RDW: 14.6 % (ref 11.5–15.5)
WBC: 4.3 10*3/uL (ref 4.0–10.5)
nRBC: 0 % (ref 0.0–0.2)

## 2023-08-08 SURGERY — VIDEO BRONCHOSCOPY WITH ENDOBRONCHIAL NAVIGATION
Anesthesia: General

## 2023-08-08 MED ORDER — LACTATED RINGERS IV SOLN: INTRAVENOUS | Status: DC

## 2023-08-08 MED ORDER — PROPOFOL 500 MG/50ML IV EMUL
INTRAVENOUS | Status: DC | PRN
  Administered 2023-08-08: 125 ug/kg/min via INTRAVENOUS

## 2023-08-08 MED ORDER — PHENYLEPHRINE 80 MCG/ML (10ML) SYRINGE FOR IV PUSH (FOR BLOOD PRESSURE SUPPORT)
PREFILLED_SYRINGE | INTRAVENOUS | Status: DC | PRN
  Administered 2023-08-08: 80 ug via INTRAVENOUS

## 2023-08-08 MED ORDER — PROPOFOL 10 MG/ML IV BOLUS
INTRAVENOUS | Status: DC | PRN
  Administered 2023-08-08: 200 mg via INTRAVENOUS

## 2023-08-08 MED ORDER — SUGAMMADEX SODIUM 200 MG/2ML IV SOLN
INTRAVENOUS | Status: DC | PRN
  Administered 2023-08-08: 200 mg via INTRAVENOUS

## 2023-08-08 MED ORDER — FENTANYL CITRATE (PF) 100 MCG/2ML IJ SOLN
INTRAMUSCULAR | Status: AC
  Filled 2023-08-08: qty 2

## 2023-08-08 MED ORDER — FENTANYL CITRATE (PF) 250 MCG/5ML IJ SOLN
INTRAMUSCULAR | Status: DC | PRN
  Administered 2023-08-08: 25 ug via INTRAVENOUS
  Administered 2023-08-08: 50 ug via INTRAVENOUS
  Administered 2023-08-08: 25 ug via INTRAVENOUS

## 2023-08-08 MED ORDER — ROCURONIUM BROMIDE 10 MG/ML (PF) SYRINGE
PREFILLED_SYRINGE | INTRAVENOUS | Status: DC | PRN
  Administered 2023-08-08: 50 mg via INTRAVENOUS

## 2023-08-08 MED ORDER — ACETAMINOPHEN 500 MG PO TABS
1000.0000 mg | ORAL_TABLET | Freq: Once | ORAL | Status: AC
  Administered 2023-08-08: 1000 mg via ORAL
  Filled 2023-08-08: qty 2

## 2023-08-08 MED ORDER — LIDOCAINE 2% (20 MG/ML) 5 ML SYRINGE
INTRAMUSCULAR | Status: DC | PRN
Start: 2023-08-08 — End: 2023-08-08
  Administered 2023-08-08: 3 mg via INTRAVENOUS

## 2023-08-08 MED ORDER — ONDANSETRON HCL 4 MG/2ML IJ SOLN
INTRAMUSCULAR | Status: DC | PRN
  Administered 2023-08-08: 4 mg via INTRAVENOUS

## 2023-08-08 MED ORDER — CHLORHEXIDINE GLUCONATE 0.12 % MT SOLN
OROMUCOSAL | Status: AC
  Administered 2023-08-08: 15 mL
  Filled 2023-08-08: qty 15

## 2023-08-08 NOTE — Transfer of Care (Signed)
 Immediate Anesthesia Transfer of Care Note  Patient: CARLIE CORPUS  Procedure(s) Performed: VIDEO BRONCHOSCOPY WITH ENDOBRONCHIAL NAVIGATION BRONCHOSCOPY, WITH NEEDLE ASPIRATION BIOPSY BRONCHOSCOPY, WITH BRUSH BIOPSY BRONCHOSCOPY, WITH BIOPSY  Patient Location: PACU  Anesthesia Type:General  Level of Consciousness: awake, alert , and oriented  Airway & Oxygen Therapy: Patient Spontanous Breathing  Post-op Assessment: Report given to RN and Post -op Vital signs reviewed and stable  Post vital signs: Reviewed and stable  Last Vitals:  Vitals Value Taken Time  BP 138/79 08/08/23 1103  Temp 36.4 C 08/08/23 1103  Pulse 75 08/08/23 1105  Resp 11 08/08/23 1105  SpO2 94 % 08/08/23 1105  Vitals shown include unfiled device data.  Last Pain:  Vitals:   08/08/23 0737  TempSrc:   PainSc: 0-No pain         Complications: No notable events documented.

## 2023-08-08 NOTE — Interval H&P Note (Signed)
 History and Physical Interval Note:  08/08/2023 7:44 AM  Wyatt Berry  has presented today for surgery, with the diagnosis of RIGHT UPPER LOBE MASS.  The various methods of treatment have been discussed with the patient and family. After consideration of risks, benefits and other options for treatment, the patient has consented to  Procedure(s) with comments: BRONCHOSCOPY, WITH BIOPSY USING ELECTROMAGNETIC NAVIGATION (Right) - WITH FLUORO as a surgical intervention.  The patient's history has been reviewed, patient examined, no change in status, stable for surgery.  I have reviewed the patient's chart and labs.  Questions were answered to the patient's satisfaction.     Leslye Peer

## 2023-08-08 NOTE — Discharge Instructions (Addendum)
 Flexible Bronchoscopy, Care After This sheet gives you information about how to care for yourself after your test. Your doctor may also give you more specific instructions. If you have problems or questions, contact your doctor. Follow these instructions at home: Eating and drinking When your numbness is gone and your cough and gag reflexes have come back, you may: Eat only soft foods. Slowly drink liquids. When you get home after the test, go back to your normal diet. Driving Do not drive for 24 hours if you were given a medicine to help you relax (sedative). Do not drive or use heavy machinery while taking prescription pain medicine. General instructions  Take over-the-counter and prescription medicines only as told by your doctor. Return to your normal activities as told. Ask what activities are safe for you. Do not use any products that have nicotine or tobacco in them. This includes cigarettes and e-cigarettes. If you need help quitting, ask your doctor. Keep all follow-up visits as told by your doctor. This is important. It is very important if you had a tissue sample (biopsy) taken. Get help right away if: You have shortness of breath that gets worse. You get light-headed. You feel like you are going to pass out (faint). You have chest pain. You cough up: More than a little blood. More blood than before. Summary Do not eat or drink anything (not even water) for 2 hours after your test, or until your numbing medicine wears off. Do not use cigarettes. Do not use e-cigarettes. Get help right away if you have chest pain.  Please call our office for any questions or concerns.  [phone].  This information is not intended to replace advice given to you by your health care provider. Make sure you discuss any questions you have with your health care provider. Document Released: 03/06/2009 Document Revised: 04/21/2017 Document Reviewed: 05/27/2016 Elsevier Patient Education  2020  [employer].

## 2023-08-08 NOTE — Op Note (Signed)
 Video Bronchoscopy with Robotic Assisted Bronchoscopic Navigation   Date of Operation: 08/08/2023   Pre-op Diagnosis: Right middle lobe nodule  Post-op Diagnosis: Right lower lobe nodule  Surgeon: Levy Pupa  Assistants: None  Anesthesia: General endotracheal anesthesia  Operation: Flexible video fiberoptic bronchoscopy with robotic assistance and biopsies.  Estimated Blood Loss: Minimal  Complications: None  Indications and History: Wyatt Berry is a 73 y.o. male with history of tobacco use, COPD, prior PE/DVT.  He has a slowly enlarging right middle lobe irregular shaped opacity concerning for possible malignancy.  Bronchoscopy in 02/2022 was nondiagnostic.  Recommendation made to repeat his navigational bronchoscopy for biopsies.. The risks, benefits, complications, treatment options and expected outcomes were discussed with the patient.  The possibilities of pneumothorax, pneumonia, reaction to medication, pulmonary aspiration, perforation of a viscus, bleeding, failure to diagnose a condition and creating a complication requiring transfusion or operation were discussed with the patient who freely signed the consent.    Description of Procedure: The patient was seen in the Preoperative Area, was examined and was deemed appropriate to proceed.  The patient was taken to [facility] endoscopy room 3, identified as Wyatt Berry and the procedure verified as Flexible Video Fiberoptic Bronchoscopy.  A Time Out was held and the above information confirmed.   Prior to the date of the procedure a high-resolution CT scan of the chest was performed. Utilizing ION software program a virtual tracheobronchial tree was generated to allow the creation of distinct navigation pathways to the patient's parenchymal abnormalities. After being taken to the operating room general anesthesia was initiated and the patient  was orally intubated. The video fiberoptic bronchoscope was introduced via the endotracheal  tube and a general inspection was performed which showed normal right and left lung anatomy. Aspiration of the bilateral mainstems was completed to remove any remaining secretions. Robotic catheter inserted into patient's endotracheal tube.   Target #1 right middle lobe nodule: The distinct navigation pathways prepared prior to this procedure were then utilized to navigate to patient's lesion identified on CT scan. The robotic catheter was secured into place and the vision probe was withdrawn.  Lesion location was approximated using fluoroscopy.  Local registration and targeting was performed using Cios three-dimensional imaging. Under fluoroscopic guidance transbronchial needle brushings, transbronchial needle biopsies, and transbronchial forceps biopsies were performed to be sent for cytology and pathology.  At the end of the procedure a general airway inspection was performed and there was no evidence of active bleeding. The bronchoscope was removed.  The patient tolerated the procedure well. There was no significant blood loss and there were no obvious complications. A post-procedural chest x-ray is pending.  Samples Target #1: 1. Transbronchial needle brushings from right middle lobe nodule 2. Transbronchial Wang needle biopsies from right middle lobe nodule 3. Transbronchial forceps biopsies from right middle lobe nodule   Plans:  The patient will be discharged from the PACU to home when recovered from anesthesia and after chest x-ray is reviewed. We will review the cytology, pathology and microbiology results with the patient when they become available. Outpatient followup will be with Dr. Judeth Horn.   Levy Pupa, MD, PhD 08/08/2023, 10:45 AM [facility] [phone] or if no answer before 7:00PM call [phone] For any issues after 7:00PM please call eLink [phone]

## 2023-08-08 NOTE — Anesthesia Procedure Notes (Addendum)
 Procedure Name: Intubation Date/Time: 08/08/2023 9:44 AM  Performed by: Hali Marry, CRNAPre-anesthesia Checklist: Patient identified, Emergency Drugs available, Suction available and Patient being monitored Patient Re-evaluated:Patient Re-evaluated prior to induction Oxygen Delivery Method: Circle system utilized Preoxygenation: Pre-oxygenation with 100% oxygen Induction Type: IV induction Ventilation: Mask ventilation without difficulty Laryngoscope Size: Glidescope and 4 Grade View: Grade II Tube type: Oral Laser Tube: Cuffed inflated with minimal occlusive pressure - saline Tube size: 8.5 mm Number of attempts: 2 (DL with Mac 4 grade 3-4 view. 2nd Laryngoscopy wwith Glidescope Go2  S4 blade.) Airway Equipment and Method: Stylet and Oral airway Placement Confirmation: ETT inserted through vocal cords under direct vision, positive ETCO2 and breath sounds checked- equal and bilateral Secured at: 23 cm Tube secured with: Tape Dental Injury: Teeth and Oropharynx as per pre-operative assessment  Comments: Prior to Laryngoscopy patient noted to have multiple loose, decaying and missing teeth top and bottom.

## 2023-08-08 NOTE — Anesthesia Preprocedure Evaluation (Signed)
 Anesthesia Evaluation  Patient identified by MRN, date of birth, ID band Patient awake    Reviewed: Allergy & Precautions, NPO status , Patient's Chart, lab work & pertinent test results  Airway Mallampati: II  TM Distance: >3 FB Neck ROM: Full    Dental   Pulmonary COPD,  COPD inhaler, Current Smoker and Patient abstained from smoking.   Pulmonary exam normal        Cardiovascular hypertension, Pt. on medications + DVT  Normal cardiovascular exam     Neuro/Psych negative neurological ROS     GI/Hepatic negative GI ROS,,,(+) Hepatitis -, C  Endo/Other  negative endocrine ROS    Renal/GU negative Renal ROS     Musculoskeletal   Abdominal   Peds  Hematology negative hematology ROS (+)   Anesthesia Other Findings   Reproductive/Obstetrics                             Anesthesia Physical Anesthesia Plan  ASA: 3  Anesthesia Plan: General   Post-op Pain Management: Tylenol PO (pre-op)*   Induction: Intravenous  PONV Risk Score and Plan: 1 and Dexamethasone, Ondansetron, Propofol infusion, TIVA and Treatment may vary due to age or medical condition  Airway Management Planned: Oral ETT  Additional Equipment:   Intra-op Plan:   Post-operative Plan: Extubation in OR  Informed Consent: I have reviewed the patients History and Physical, chart, labs and discussed the procedure including the risks, benefits and alternatives for the proposed anesthesia with the patient or authorized representative who has indicated his/her understanding and acceptance.     Dental advisory given  Plan Discussed with: CRNA  Anesthesia Plan Comments:        Anesthesia Quick Evaluation

## 2023-08-09 LAB — CYTOLOGY - NON PAP

## 2023-08-09 NOTE — Anesthesia Postprocedure Evaluation (Signed)
 Anesthesia Post Note  Patient: Wyatt Berry  Procedure(s) Performed: VIDEO BRONCHOSCOPY WITH ENDOBRONCHIAL NAVIGATION BRONCHOSCOPY, WITH NEEDLE ASPIRATION BIOPSY BRONCHOSCOPY, WITH BRUSH BIOPSY BRONCHOSCOPY, WITH BIOPSY     Patient location during evaluation: PACU Anesthesia Type: General Level of consciousness: awake and alert Pain management: pain level controlled Vital Signs Assessment: post-procedure vital signs reviewed and stable Respiratory status: spontaneous breathing, nonlabored ventilation, respiratory function stable and patient connected to nasal cannula oxygen Cardiovascular status: blood pressure returned to baseline and stable Postop Assessment: no apparent nausea or vomiting Anesthetic complications: no   No notable events documented.  Last Vitals:  Vitals:   08/08/23 1130 08/08/23 1145  BP: 129/75 134/73  Pulse: 71 66  Resp: 18 14  Temp:  (!) 36.4 C  SpO2: 92% 95%    Last Pain:  Vitals:   08/08/23 1130  TempSrc:   PainSc: 0-No pain   Pain Goal:                   Kennieth Rad

## 2023-08-11 ENCOUNTER — Encounter (HOSPITAL_COMMUNITY): Payer: Self-pay | Admitting: Emergency Medicine

## 2023-08-14 ENCOUNTER — Telehealth: Payer: Self-pay | Admitting: Emergency Medicine

## 2023-08-14 DIAGNOSIS — C3491 Malignant neoplasm of unspecified part of right bronchus or lung: Secondary | ICD-10-CM

## 2023-08-14 NOTE — Telephone Encounter (Signed)
 Discussed results with the patient.  I have made a referral for him to be seen in medical oncology, [facility]

## 2023-08-14 NOTE — Telephone Encounter (Signed)
 Called the patient discussed bronchoscopy results.  No answer.  Left him a voicemail and will try to call him back.  His right lower lobe biopsy showed adenocarcinoma.  Will help arrange next steps in his care once we have an opportunity to discuss.

## 2023-08-14 NOTE — Telephone Encounter (Signed)
 Patient is returning phone call. Patient phone number is [phone].

## 2023-08-14 NOTE — Telephone Encounter (Signed)
 Called patient Breo.  No answer.  Will continue to try him today.

## 2023-08-16 ENCOUNTER — Other Ambulatory Visit: Payer: Self-pay | Admitting: Emergency Medicine

## 2023-08-16 DIAGNOSIS — C3490 Malignant neoplasm of unspecified part of unspecified bronchus or lung: Secondary | ICD-10-CM

## 2023-08-16 NOTE — Progress Notes (Signed)
 MRI brain and PET scan ordered

## 2023-08-17 ENCOUNTER — Other Ambulatory Visit: Payer: Self-pay

## 2023-08-17 NOTE — Progress Notes (Signed)
 The proposed treatment discussed in conference is for discussion purpose only and is not a binding recommendation.  The patients have not been physically examined, or presented with their treatment options.  Therefore, final treatment plans cannot be decided.

## 2023-08-18 ENCOUNTER — Telehealth: Payer: Self-pay | Admitting: Emergency Medicine

## 2023-08-18 NOTE — Progress Notes (Signed)
 [facility] Telephone:([phone]) [phone]   Fax:([phone]) [phone]  CONSULT NOTE  REFERRING PHYSICIAN: Dr. Delton Coombes   REASON FOR CONSULTATION:  Non-small cell lung cancer, adenocarcinoma  HPI Wyatt Berry is a 73 y.o. male with a medical history of hypertension, hepatitis C, prediabetes, DVT, and PE is referred to the clinic for newly diagnosed non-small cell lung cancer, adenocarcinoma.  The patient is followed by pulmonary medicine.  He is part of the low-dose lung cancer screening program.  He had a low-dose lung cancer CT scan on 01/2022 that discovered a pulmonary nodule.  This was previously biopsied and nondiagnostic.  PET scan performed in October 2023 showed that the right middle lobe nodule was hypermetabolic and suspicious for bronchogenic carcinoma.  There was another 11 mm nodule in the left lower lobe that did not have any hypermetabolic activity which was suggestive of benign granuloma.  It was monitored and stable on scans from March 2024 and September 2024 but the scan from 07/2023 showed stable to increased in size in the right middle lobe lesion.  Per radiology from 07/25/23, the complex cyst and solid appearing lesion in the upper aspect of the right middle lobe measuring 3.2 x 3.1 x 3.1 cm and appears more solid than on prior studies which is concerning for neoplasm.  There is also a stable 11 mm left lower lobe pulmonary nodule and stable small scattered mediastinal and hilar nodes and there is no new or progressive disease.  Therefore, he was followed closely and he was seen by pulmonary medicine on 08/03/2023.  The patient underwent bronchoscopy and biopsy by Dr. Delton Coombes on 08/08/2023.  The final pathology [id number]) of the right middle lobe FNA showed adenocarcinoma.  The patient's case was discussed at the multidisciplinary conference on 08/17/2023.  The plan was to obtain PFTs and possible referral to surgery vs radiation.  He is also scheduled for a repeat PET  scan on 08/23/23.   Overall, the patient is feeling "fine" today.  States he cut the grass a few days ago.  He also reports he has a good appetite.  He is working on smoking cessation for which she is currently taking Chantix.  Some days he does not smoke any cigarettes but some days he may smoke 1 to 2 cigarettes.  He denies any fever, chills, night sweats, or unexplained weight loss.  He denies any significant dyspnea on exertion except.  He may cough "some" such as if he is outside.  He denies any hemoptysis or chest pain.  He denies any nausea, vomiting, diarrhea, or constipation.  He denies any headache or visual changes.  Per chart review, it looks like he was previously seen by Dr. Al Pimple who, the most recent 2022 for a history of DVT and PE from 2017. He was on Xarelto for 4.5 years which was discontinued in 2022. He is currently on 81 mg aspirin. Per chart review, it was noted to be unprovoked left lower extremity proximal vein DVT. He had hypercoagulable workup performed. His lupus anticoagulant was rechecked in 08/2020 and they determined that there was no concern for APLS.   The patient's family history consist of a mother and father who had heart disease.  The patient reports that his siblings have diabetes.  He denies any family history of blood clot or malignancy.  The patient used to work in [employer].  He is married.  He does not have any children.  He smoked approximately 40 years averaging 1  pack of cigarettes per day but he is currently trying to quit.  He has a prior history of alcohol use but he was treated for hep C in the past for which he follows closely with a hepatologist.  The patient has not consumed alcohol since being diagnosed with hep C.  The patient denies any current or prior history of street drug use.   HPI  Past Medical History:  Diagnosis Date   AAA (abdominal aortic aneurysm) without rupture (HCC) 01/01/2021   3.7 cm infrarenal AAA on PET 02/2022    Cancer [facility])    bladder   Cirrhosis of liver (HCC)    COPD (chronic obstructive pulmonary disease) (HCC)    Per patient mild   DVT (deep venous thrombosis) (HCC) 05/02/2016   subacute LLE DVT 05/02/16   Hepatitis C    HCV, s/p treatment   HLD (hyperlipidemia)    Hypertension    Lung nodule 02/23/2022   left lung   PE (pulmonary thromboembolism) (HCC) 05/02/2016   bilateral at least submassive PE with right heart strain, s/p Xarelto x 4 years   Pre-diabetes 12/2020   no meds    Past Surgical History:  Procedure Laterality Date   BRONCHIAL BIOPSY  03/22/2022   Procedure: BRONCHIAL BIOPSIES;  Surgeon: Josephine Igo, DO;  Location: [facility];  Service: Pulmonary;;   BRONCHIAL BIOPSY  08/08/2023   Procedure: BRONCHOSCOPY, WITH BIOPSY;  Surgeon: Leslye Peer, MD;  Location: [facility];  Service: Pulmonary;;   BRONCHIAL BRUSHINGS  08/08/2023   Procedure: BRONCHOSCOPY, WITH BRUSH BIOPSY;  Surgeon: Leslye Peer, MD;  Location: [facility];  Service: Pulmonary;;   BRONCHIAL NEEDLE ASPIRATION BIOPSY  03/22/2022   Procedure: BRONCHIAL NEEDLE ASPIRATION BIOPSIES;  Surgeon: Josephine Igo, DO;  Location: [facility];  Service: Pulmonary;;   BRONCHIAL NEEDLE ASPIRATION BIOPSY  08/08/2023   Procedure: BRONCHOSCOPY, WITH NEEDLE ASPIRATION BIOPSY;  Surgeon: Leslye Peer, MD;  Location: [facility];  Service: Pulmonary;;   COLONOSCOPY     CYSTOSCOPY W/ RETROGRADES Bilateral 06/14/2022   Procedure: CYSTOSCOPY WITH RETROGRADE PYELOGRAM;  Surgeon: [name], MD;  Location: [facility];  Service: Urology;  Laterality: Bilateral;   CYSTOSCOPY W/ RETROGRADES Bilateral 10/11/2022   Procedure: CYSTOSCOPY WITH RETROGRADE PYELOGRAM;  Surgeon: [name], MD;  Location: [facility];  Service: Urology;  Laterality: Bilateral;   TONSILLECTOMY     TRANSURETHRAL RESECTION OF BLADDER TUMOR N/A 06/14/2022   Procedure: TRANSURETHRAL RESECTION OF BLADDER TUMOR (TURBT), BLADDER BIOPSY;  Surgeon:  [name], MD;  Location: [facility];  Service: Urology;  Laterality: N/A;  45 MINS   TRANSURETHRAL RESECTION OF BLADDER TUMOR N/A 10/11/2022   Procedure: TRANSURETHRAL RESECTION OF BLADDER TUMOR (TURBT);  Surgeon: [name], MD;  Location: [facility];  Service: Urology;  Laterality: N/A;  45 MINS   VIDEO BRONCHOSCOPY WITH ENDOBRONCHIAL NAVIGATION  08/08/2023   Procedure: VIDEO BRONCHOSCOPY WITH ENDOBRONCHIAL NAVIGATION;  Surgeon: Leslye Peer, MD;  Location: [facility];  Service: Pulmonary;;   VIDEO BRONCHOSCOPY WITH ENDOBRONCHIAL ULTRASOUND Bilateral 03/22/2022   Procedure: VIDEO BRONCHOSCOPY WITH ENDOBRONCHIAL ULTRASOUND;  Surgeon: Josephine Igo, DO;  Location: [facility];  Service: Pulmonary;  Laterality: Bilateral;    Family History  Problem Relation Age of Onset   Diabetes Mother    Congestive Heart Failure Mother    Heart disease Father     Social History Social History   Tobacco Use   Smoking status: Some Days    Current packs/day:  0.25    Average packs/day: 0.3 packs/day for 52.2 years (13.1 ttl pk-yrs)    Types: Cigarettes    Start date: 1973   Smokeless tobacco: Never   Tobacco comments:    Pt states that he smokes less than 5 cigarettes a day. 04/05/22 ALS   Vaping Use   Vaping status: Never Used  Substance Use Topics   Alcohol use: No   Drug use: No    No Known Allergies  Current Outpatient Medications  Medication Sig Dispense Refill   amLODipine (NORVASC) 5 MG tablet Take 5 mg by mouth daily.     aspirin EC 81 MG tablet Take 81 mg by mouth daily.     atorvastatin (LIPITOR) 10 MG tablet Take 10 mg by mouth daily.     losartan-hydrochlorothiazide (HYZAAR) 100-12.5 MG per tablet Take 1 tablet by mouth daily.     metoprolol succinate (TOPROL-XL) 25 MG 24 hr tablet Take 25 mg by mouth at bedtime.     Multiple Vitamin (MULTIVITAMIN) tablet Take 1 tablet by mouth daily.     tamsulosin (FLOMAX) 0.4 MG CAPS capsule Take 0.4 mg by mouth at bedtime.      No current facility-administered medications for this visit.    REVIEW OF SYSTEMS:   Review of Systems  Constitutional: Negative for appetite change, chills, fatigue, fever and unexpected weight change.  HENT: Negative for mouth sores, nosebleeds, sore throat and trouble swallowing.   Eyes: Negative for eye problems and icterus.  Respiratory: Negative for cough, hemoptysis, shortness of breath and wheezing.   Cardiovascular: Negative for chest pain and leg swelling.  Gastrointestinal: Negative for abdominal pain, constipation, diarrhea, nausea and vomiting.  Genitourinary: Negative for bladder incontinence, difficulty urinating, dysuria, frequency and hematuria.   Musculoskeletal: Negative for back pain, gait problem, neck pain and neck stiffness.  Skin: Negative for itching and rash.  Neurological: Negative for dizziness, extremity weakness, gait problem, headaches, light-headedness and seizures.  Hematological: Negative for adenopathy. Does not bruise/bleed easily.  Psychiatric/Behavioral: Negative for confusion, depression and sleep disturbance. The patient is not nervous/anxious.     PHYSICAL EXAMINATION:  There were no vitals taken for this visit.  ECOG PERFORMANCE STATUS: 0  Physical Exam  Constitutional: Oriented to person, place, and time and well-developed, well-nourished, and in no distress.  HENT:  Head: Normocephalic and atraumatic.  Mouth/Throat: Oropharynx is clear and moist. No oropharyngeal exudate.  Eyes: Conjunctivae are normal. Right eye exhibits no discharge. Left eye exhibits no discharge. No scleral icterus.  Neck: Normal range of motion. Neck supple.  Cardiovascular: Normal rate, regular rhythm, normal heart sounds and intact distal pulses.   Pulmonary/Chest: Effort normal and breath sounds normal. No respiratory distress. No wheezes. No rales.  Abdominal: Soft. Bowel sounds are normal. Exhibits no distension and no mass. There is no tenderness.   Musculoskeletal: Normal range of motion. Exhibits no edema.  Lymphadenopathy:    No cervical adenopathy.  Neurological: Alert and oriented to person, place, and time. Exhibits normal muscle tone. Gait normal. Coordination normal.  Skin: Skin is warm and dry. No rash noted. Not diaphoretic. No erythema. No pallor.  Psychiatric: Mood, memory and judgment normal.  Vitals reviewed.  LABORATORY DATA: Lab Results  Component Value Date   WBC 4.3 08/08/2023   HGB 15.9 08/08/2023   HCT 46.6 08/08/2023   MCV 89.8 08/08/2023   PLT 220 08/08/2023      Chemistry      Component Value Date/Time   NA 137 08/08/2023 0745  K 4.1 08/08/2023 0745   CL 102 08/08/2023 0745   CO2 21 (L) 08/08/2023 0745   BUN 10 08/08/2023 0745   CREATININE 1.12 08/08/2023 0745      Component Value Date/Time   CALCIUM 9.4 08/08/2023 0745   ALKPHOS 47 08/08/2023 0745   AST 31 08/08/2023 0745   ALT 20 08/08/2023 0745   BILITOT 0.8 08/08/2023 0745       RADIOGRAPHIC STUDIES: DG Chest Port 1 View Result Date: 08/08/2023 CLINICAL DATA:  Status post bronchoscopy with biopsy. EXAM: PORTABLE CHEST 1 VIEW COMPARISON:  March 22, 2022. FINDINGS: The heart size and mediastinal contours are within normal limits. Stable probable emphysematous disease seen in both upper lobes and bibasilar scarring. No definite acute abnormality seen. No definite pneumothorax or pleural effusion. The visualized skeletal structures are unremarkable. IMPRESSION: Stable chronic findings as noted above. No definite acute abnormality seen. Electronically Signed   By: Lupita Raider M.D.   On: 08/08/2023 13:07   DG C-ARM BRONCHOSCOPY Result Date: 08/08/2023 C-ARM BRONCHOSCOPY: Fluoroscopy was utilized by the requesting physician.  No radiographic interpretation.   CT Super D Chest Wo Contrast Result Date: 08/03/2023 CLINICAL DATA:  Followup pulmonary nodule. History of bladder cancer. Pre EMB and biopsy. * Tracking Code: BO * EXAM: CT CHEST  WITHOUT CONTRAST TECHNIQUE: Multidetector CT imaging of the chest was performed using thin slice collimation for electromagnetic bronchoscopy planning purposes, without intravenous contrast. RADIATION DOSE REDUCTION: This exam was performed according to the departmental dose-optimization program which includes automated exposure control, adjustment of the mA and/or kV according to patient size and/or use of iterative reconstruction technique. COMPARISON:  Chest CT 02/08/2023.  Prior PET-CT 03/07/2022 FINDINGS: Cardiovascular: The heart is normal in size. No pericardial effusion. Stable tortuosity and calcification of the thoracic aorta. Stable advanced three-vessel coronary artery calcifications. Stable incidental lipomatous hypertrophy of the interatrial septum. Mediastinum/Nodes: Small scattered mediastinal and hilar lymph nodes are stable. No new or progressive findings. The esophagus is grossly normal. Lungs/Pleura: Enlarging complex cystic and solid appearing lesion in the upper aspect of the right middle lobe. This measures approximately 3.2 x 3.1 x 3.1 cm and appears more solid than the prior study. Previously measured approximately 3.0 x 2.7 x 2.6 cm. Left lower lobe peripheral pulmonary nodule is stable measuring 11 mm on image 95/5. No new pulmonary lesions or nodules. Stable significant underlying emphysematous lung disease and associated significant basilar pulmonary scarring. No pleural effusions or pleural lesions. Upper Abdomen: No significant upper abdominal findings. Stable hepatic cyst. No adrenal gland lesions. No upper abdominal adenopathy. Stable atherosclerotic calcifications involving the aorta and branch vessels. Musculoskeletal: No chest wall mass, supraclavicular or axillary adenopathy. The thyroid gland appears normal. The bony thorax is intact. No worrisome lytic or sclerotic lesions. IMPRESSION: 1. Enlarging complex cystic and solid appearing lesion in the upper aspect of the right  middle lobe. This measures approximately 3.2 x 3.1 x 3.1 cm and appears more solid than the prior study. Findings are concerning for neoplasm. 2. Stable 11 mm left lower lobe pulmonary nodule. 3. Stable small scattered mediastinal and hilar lymph nodes. No new or progressive findings. 4. Stable significant underlying emphysematous lung disease and associated significant basilar pulmonary scarring. 5. Stable advanced three-vessel coronary artery calcifications. Electronically Signed   By: Rudie Meyer M.D.   On: 08/03/2023 15:38    ASSESSMENT: Is a very pleasant 73 year old African-American male diagnosed with non-small cell lung cancer, adenocarcinoma. The patient presented with a right middle lobe lung  mass.  Pending further staging workup with PET scan which is scheduled for tomorrow on 08/23/23 to assess the stable 11 mm LLL pulmonary nodule and stable scattered mediastinal and hilar lymph nodes. He was diagnosed in March 2025.   The patient's case was discussed at the multidisciplinary conference on 08/17/2023. The patient was seen with Dr. Arbutus Ped who reviewed the plan. The recommendation was to be evaluated by surgery vs radiation if not a candidate for surgery and if no concerns for other sites of disease. We will order PFTs since his last PFTs were from 2023. I have ordered repeat PFTs. He is also scheduled to complete the staging workup with a PET scan tomorrow on 08/23/2023.The patient understands the importance of the PET scan with the staging workup.    I will place the referral to CT surgery. If he is not a candidate for surgery or if the scan shows concern for additional sites of disease, then we would recommend seeing the patient back to discuss other options.   He will continue to work on smoking cessation   Hx of DVT/PE in 2017 -Was on Xarelto for 4.5 years -Was previously seen by Dr. Al Pimple. Prior hypercoag workup negative. -Currently on 81 mg aspirin daily   If the patient is found to  be a surgical candidate and undergo surgery, we will see the patient back following surgery to discuss next steps and close monitoring.   The patient voices understanding of current disease status and treatment options and is in agreement with the current care plan.  All questions were answered. The patient knows to call the clinic with any problems, questions or concerns. We can certainly see the patient much sooner if necessary.  Thank you so much for allowing me to participate in the care of KYRESE GARTMAN. I will continue to follow up the patient with you and assist in his care.   Disclaimer: This note was dictated with voice recognition software. Similar sounding words can inadvertently be transcribed and may not be corrected upon review.   Mignonne Afonso L Quindon Denker August 18, 2023, 1:20 PM  ADDENDUM: Hematology/Oncology Attending:  I had a face-to-face encounter with the patient today.  I reviewed his record, lab, scans and recommended his care plan.  This is a very pleasant 73 years old African-American male with past medical history significant for hypertension, prediabetes, deep venous thrombosis with pulmonary embolism as well as hepatitis C and long history of smoking.  The patient has known pulmonary nodules in the right lung that was seen on CT screening of the lung cancer since September 2023.  He had a nondiagnostic biopsy in the past.  A PET scan at that time showed hypermetabolic activity in the right middle lobe lung nodule that was still suspicious for primary bronchogenic carcinoma.  There was also a 1.1 cm nodule in the left lower lobe that did not exhibit any hypermetabolic activity.  He was followed by close monitoring and repeat CT scan of the chest in March 2025 showed increase in the size of the right middle lobe lung nodule.  It measured 3.2 x 3.1 x 3.1 cm.  There was also a stable 1.1 cm left lower lobe pulmonary nodule in addition to small scattered mediastinal and hilar  lymph nodes.  The patient was seen by Dr. Delton Coombes and had repeat bronchoscopy on August 08, 2023 and the final pathology was consistent with non-small cell lung cancer, adenocarcinoma.  He is here today for evaluation and discussion of  his treatment options.  He is scheduled to have a PET scan tomorrow. I had a lengthy discussion with the patient today about his condition and possible treatment options. We will need a PET scan to rule out any metastatic mediastinal/hilar lymphadenopathy and also to evaluate the left lower lobe nodule again. If there is no hypermetabolic activity in the mediastinal/hilar lymph node or the left lower lobe lung nodule or any evidence of distant metastatic disease, we will refer the patient for surgical evaluation and will order PFTs before his evaluation by surgery If the patient is not a candidate for surgical resection, we will refer him to radiation oncology for possible patient of curative SBRT to the right middle lobe lung nodule. The patient was advised to call immediately if he has any other concerning symptoms in the interval. The total time spent in the appointment was 60 minutes. Disclaimer: This note was dictated with voice recognition software. Similar sounding words can inadvertently be transcribed and may be missed upon review. Lajuana Matte, MD

## 2023-08-18 NOTE — Telephone Encounter (Signed)
 Atc pt x1 LDVM regarding RB's note. Advised pt if he had any concerns to call back. Nothing further needed.

## 2023-08-18 NOTE — Telephone Encounter (Signed)
 Yes he needs the PET as part of the staging workup for his cancer. It will help plan his care I would be ok canceling appt w Dr Judeth Horn since patient already has results.

## 2023-08-18 NOTE — Telephone Encounter (Signed)
 The patient stated that the PET scan is not necessary and was instructed by Dr. Delton Coombes to disregard any other appointments and go directly to the cancer center. The patient would like a call from Dr. Delton Coombes to clear up any issues. I informed the patient that the PET scan was ordered on 08/16/23, while their conversation with Dr. Delton Coombes took place on 08/14/23. The patient is currently scheduled for the PET scan on 08/23/23.Let me know if I should cancel the appointment.

## 2023-08-18 NOTE — Telephone Encounter (Signed)
 Spoke with Wyatt Berry. Pt has appt w/ Cancer center 4/1. He is concerned if he needs to have the PET scan done since he hasn't met with cancer center yet, it has been scheduled for 4/2. He also has an appt scheduled for 4/2 for bronch f/u with MH. RB has already reviewed results otp.   Dr. Delton Coombes please advise the following Cancel or keep appt 4/2? Does pt need to have the PET done? if so pt would like to know why.

## 2023-08-21 ENCOUNTER — Telehealth: Payer: Self-pay

## 2023-08-21 ENCOUNTER — Other Ambulatory Visit: Payer: Self-pay | Admitting: Physician Assistant

## 2023-08-21 DIAGNOSIS — C3490 Malignant neoplasm of unspecified part of unspecified bronchus or lung: Secondary | ICD-10-CM

## 2023-08-21 NOTE — Telephone Encounter (Signed)
 Copied from CRM [id number]. Topic: General - Other >> Aug 18, 2023  2:45 PM Orinda Kenner C wrote: Reason for CRM: Patient  [phone] is returning the office, messages was left for test and patient does not understand, or know where to go and wants to speak with nurse. Per CAL, send a crm. Please advise and call back.  Spoke with Wyatt Berry. Pt had concerned ab PET scan and where it was. I informed pt of PET scheduling info. Pt verbalized understanding & had no other concerns. Nfn

## 2023-08-22 ENCOUNTER — Other Ambulatory Visit

## 2023-08-22 ENCOUNTER — Inpatient Hospital Stay: Attending: Physician Assistant | Admitting: Physician Assistant

## 2023-08-22 ENCOUNTER — Inpatient Hospital Stay

## 2023-08-22 ENCOUNTER — Other Ambulatory Visit: Payer: Self-pay

## 2023-08-22 VITALS — BP 142/76 | HR 81 | Temp 97.9°F | Resp 19 | Ht 74.0 in | Wt 229.9 lb

## 2023-08-22 DIAGNOSIS — Z7982 Long term (current) use of aspirin: Secondary | ICD-10-CM

## 2023-08-22 DIAGNOSIS — Z720 Tobacco use: Secondary | ICD-10-CM | POA: Diagnosis not present

## 2023-08-22 DIAGNOSIS — C3431 Malignant neoplasm of lower lobe, right bronchus or lung: Secondary | ICD-10-CM

## 2023-08-22 DIAGNOSIS — C342 Malignant neoplasm of middle lobe, bronchus or lung: Secondary | ICD-10-CM | POA: Insufficient documentation

## 2023-08-22 DIAGNOSIS — C3490 Malignant neoplasm of unspecified part of unspecified bronchus or lung: Secondary | ICD-10-CM

## 2023-08-22 DIAGNOSIS — Z86718 Personal history of other venous thrombosis and embolism: Secondary | ICD-10-CM | POA: Diagnosis not present

## 2023-08-22 LAB — CBC WITH DIFFERENTIAL (CANCER CENTER ONLY)
Abs Immature Granulocytes: 0.01 10*3/uL (ref 0.00–0.07)
Basophils Absolute: 0 10*3/uL (ref 0.0–0.1)
Basophils Relative: 0 %
Eosinophils Absolute: 0 10*3/uL (ref 0.0–0.5)
Eosinophils Relative: 1 %
HCT: 43.6 % (ref 39.0–52.0)
Hemoglobin: 15.1 g/dL (ref 13.0–17.0)
Immature Granulocytes: 0 %
Lymphocytes Relative: 53 %
Lymphs Abs: 2.5 10*3/uL (ref 0.7–4.0)
MCH: 30.3 pg (ref 26.0–34.0)
MCHC: 34.6 g/dL (ref 30.0–36.0)
MCV: 87.6 fL (ref 80.0–100.0)
Monocytes Absolute: 0.4 10*3/uL (ref 0.1–1.0)
Monocytes Relative: 9 %
Neutro Abs: 1.7 10*3/uL (ref 1.7–7.7)
Neutrophils Relative %: 37 %
Platelet Count: 217 10*3/uL (ref 150–400)
RBC: 4.98 MIL/uL (ref 4.22–5.81)
RDW: 14.3 % (ref 11.5–15.5)
WBC Count: 4.7 10*3/uL (ref 4.0–10.5)
nRBC: 0 % (ref 0.0–0.2)

## 2023-08-22 LAB — CMP (CANCER CENTER ONLY)
ALT: 20 U/L (ref 0–44)
AST: 37 U/L (ref 15–41)
Albumin: 4.4 g/dL (ref 3.5–5.0)
Alkaline Phosphatase: 55 U/L (ref 38–126)
Anion gap: 8 (ref 5–15)
BUN: 11 mg/dL (ref 8–23)
CO2: 26 mmol/L (ref 22–32)
Calcium: 9.5 mg/dL (ref 8.9–10.3)
Chloride: 103 mmol/L (ref 98–111)
Creatinine: 1.21 mg/dL (ref 0.61–1.24)
GFR, Estimated: >60 mL/min (ref >60)
Glucose, Bld: 107 mg/dL — ABNORMAL HIGH (ref 70–99)
Potassium: 4.5 mmol/L (ref 3.5–5.1)
Sodium: 137 mmol/L (ref 135–145)
Total Bilirubin: 0.6 mg/dL (ref 0.0–1.2)
Total Protein: 7.8 g/dL (ref 6.5–8.1)

## 2023-08-23 ENCOUNTER — Ambulatory Visit (HOSPITAL_COMMUNITY)
Admission: RE | Admit: 2023-08-23 | Discharge: 2023-08-23 | Disposition: A | Discharge status: Home / Self Care | Source: Ambulatory Visit | Attending: Emergency Medicine | Admitting: Emergency Medicine

## 2023-08-23 ENCOUNTER — Ambulatory Visit: Admitting: Pulmonary Disease

## 2023-08-23 DIAGNOSIS — C3490 Malignant neoplasm of unspecified part of unspecified bronchus or lung: Secondary | ICD-10-CM | POA: Diagnosis not present

## 2023-08-23 DIAGNOSIS — R911 Solitary pulmonary nodule: Secondary | ICD-10-CM | POA: Diagnosis not present

## 2023-08-23 LAB — GLUCOSE, CAPILLARY: Glucose-Capillary: 100 mg/dL — ABNORMAL HIGH (ref 70–99)

## 2023-08-23 MED ORDER — FLUDEOXYGLUCOSE F - 18 (FDG) INJECTION
11.4500 | Freq: Once | INTRAVENOUS | Status: AC
  Administered 2023-08-23: 11.36 via INTRAVENOUS

## 2023-08-29 ENCOUNTER — Ambulatory Visit (HOSPITAL_COMMUNITY)
Admission: RE | Admit: 2023-08-29 | Discharge: 2023-08-29 | Disposition: A | Discharge status: Home / Self Care | Source: Ambulatory Visit | Attending: Physician Assistant | Admitting: Physician Assistant

## 2023-08-29 DIAGNOSIS — J984 Other disorders of lung: Secondary | ICD-10-CM | POA: Diagnosis not present

## 2023-08-29 DIAGNOSIS — F17210 Nicotine dependence, cigarettes, uncomplicated: Secondary | ICD-10-CM | POA: Diagnosis not present

## 2023-08-29 DIAGNOSIS — Z01811 Encounter for preprocedural respiratory examination: Secondary | ICD-10-CM | POA: Insufficient documentation

## 2023-08-29 DIAGNOSIS — C3490 Malignant neoplasm of unspecified part of unspecified bronchus or lung: Secondary | ICD-10-CM | POA: Insufficient documentation

## 2023-08-29 DIAGNOSIS — Z01818 Encounter for other preprocedural examination: Secondary | ICD-10-CM | POA: Diagnosis present

## 2023-08-29 LAB — PULMONARY FUNCTION TEST
DL/VA % pred: 82 %
DL/VA: 3.27 ml/min/mmHg/L
DLCO cor % pred: 61 %
DLCO cor: 17.1 ml/min/mmHg
DLCO unc % pred: 61 %
DLCO unc: 17.34 ml/min/mmHg
FEF 25-75 Post: 1.1 L/s
FEF 25-75 Pre: 1.08 L/s
FEF2575-%Change-Post: 1 %
FEF2575-%Pred-Post: 41 %
FEF2575-%Pred-Pre: 40 %
FEV1-%Change-Post: 0 %
FEV1-%Pred-Post: 63 %
FEV1-%Pred-Pre: 63 %
FEV1-Post: 2.27 L
FEV1-Pre: 2.26 L
FEV1FVC-%Change-Post: -1 %
FEV1FVC-%Pred-Pre: 85 %
FEV6-%Change-Post: 0 %
FEV6-%Pred-Post: 74 %
FEV6-%Pred-Pre: 74 %
FEV6-Post: 3.43 L
FEV6-Pre: 3.44 L
FEV6FVC-%Change-Post: -1 %
FEV6FVC-%Pred-Post: 98 %
FEV6FVC-%Pred-Pre: 100 %
FVC-%Change-Post: 2 %
FVC-%Pred-Post: 75 %
FVC-%Pred-Pre: 74 %
FVC-Post: 3.7 L
FVC-Pre: 3.62 L
Post FEV1/FVC ratio: 61 %
Post FEV6/FVC ratio: 93 %
Pre FEV1/FVC ratio: 62 %
Pre FEV6/FVC Ratio: 95 %
RV % pred: 87 %
RV: 2.32 L
TLC % pred: 79 %
TLC: 6.04 L

## 2023-08-29 MED ORDER — ALBUTEROL SULFATE (2.5 MG/3ML) 0.083% IN NEBU
2.5000 mg | INHALATION_SOLUTION | Freq: Once | RESPIRATORY_TRACT | Status: AC
  Administered 2023-08-29: 2.5 mg via RESPIRATORY_TRACT

## 2023-08-30 NOTE — Progress Notes (Deleted)
 [address]       [place] [address]             [phone]                    ROHNAN BARTLESON [facility] Medical Record #[id number] Date of Birth: 18-Apr-1951  Referring: Heilingoetter, Cassandr* Primary Care: Georgann Housekeeper, MD Primary Cardiologist: None  Chief Complaint:   No chief complaint on file.   History of Present Illness:    Wyatt Berry 73 y.o. male presents for surgical evaluation of a ***    Smoking Hx: ***      Past Medical History:  Diagnosis Date   AAA (abdominal aortic aneurysm) without rupture (HCC) 01/01/2021   3.7 cm infrarenal AAA on PET 02/2022   Cancer (HCC)    bladder   Cirrhosis of liver (HCC)    COPD (chronic obstructive pulmonary disease) (HCC)    Per patient mild   DVT (deep venous thrombosis) (HCC) 05/02/2016   subacute LLE DVT 05/02/16   Hepatitis C    HCV, s/p treatment   HLD (hyperlipidemia)    Hypertension    Lung nodule 02/23/2022   left lung   PE (pulmonary thromboembolism) (HCC) 05/02/2016   bilateral at least submassive PE with right heart strain, s/p Xarelto x 4 years   Pre-diabetes 12/2020   no meds    Past Surgical History:  Procedure Laterality Date   BRONCHIAL BIOPSY  03/22/2022   Procedure: BRONCHIAL BIOPSIES;  Surgeon: Josephine Igo, DO;  Location: [facility];  Service: Pulmonary;;   BRONCHIAL BIOPSY  08/08/2023   Procedure: BRONCHOSCOPY, WITH BIOPSY;  Surgeon: Leslye Peer, MD;  Location: [facility];  Service: Pulmonary;;   BRONCHIAL BRUSHINGS  08/08/2023   Procedure: BRONCHOSCOPY, WITH BRUSH BIOPSY;  Surgeon: Leslye Peer, MD;  Location: [facility];  Service: Pulmonary;;   BRONCHIAL NEEDLE ASPIRATION BIOPSY  03/22/2022   Procedure: BRONCHIAL NEEDLE ASPIRATION BIOPSIES;  Surgeon: Josephine Igo, DO;  Location: [facility];  Service: Pulmonary;;   BRONCHIAL NEEDLE ASPIRATION BIOPSY  08/08/2023   Procedure: BRONCHOSCOPY, WITH NEEDLE ASPIRATION BIOPSY;  Surgeon: Leslye Peer, MD;  Location: [facility];  Service: Pulmonary;;   COLONOSCOPY     CYSTOSCOPY W/ RETROGRADES Bilateral 06/14/2022   Procedure: CYSTOSCOPY WITH RETROGRADE PYELOGRAM;  Surgeon: [name], MD;  Location: [facility];  Service: Urology;  Laterality: Bilateral;   CYSTOSCOPY W/ RETROGRADES Bilateral 10/11/2022   Procedure: CYSTOSCOPY WITH RETROGRADE PYELOGRAM;  Surgeon: [name], MD;  Location: [facility];  Service: Urology;  Laterality: Bilateral;   TONSILLECTOMY     TRANSURETHRAL RESECTION OF BLADDER TUMOR N/A 06/14/2022   Procedure: TRANSURETHRAL RESECTION OF BLADDER TUMOR (TURBT), BLADDER BIOPSY;  Surgeon: [name], MD;  Location: [facility];  Service: Urology;  Laterality: N/A;  45 MINS   TRANSURETHRAL RESECTION OF BLADDER TUMOR N/A 10/11/2022   Procedure: TRANSURETHRAL RESECTION OF BLADDER TUMOR (TURBT);  Surgeon: [name], MD;  Location: [facility];  Service: Urology;  Laterality: N/A;  45 MINS   VIDEO BRONCHOSCOPY WITH ENDOBRONCHIAL NAVIGATION  08/08/2023   Procedure: VIDEO BRONCHOSCOPY WITH ENDOBRONCHIAL NAVIGATION;  Surgeon: Leslye Peer, MD;  [facility]: [facility];  Service: Pulmonary;;   VIDEO BRONCHOSCOPY WITH ENDOBRONCHIAL ULTRASOUND Bilateral 03/22/2022   Procedure: VIDEO BRONCHOSCOPY WITH ENDOBRONCHIAL ULTRASOUND;  Surgeon: Josephine Igo, DO;  Location: [facility];  Service: Pulmonary;  Laterality: Bilateral;    Family History  Problem Relation Age of Onset   Diabetes Mother    Congestive Heart Failure Mother    Heart disease Father      Social History   Tobacco Use  Smoking Status Some Days   Current packs/day: 0.25   Average packs/day: 0.3 packs/day for 52.3 years (13.1 ttl pk-yrs)   Types: Cigarettes   Start date: 1973  Smokeless Tobacco Never  Tobacco Comments   Pt states that he smokes less than 5 cigarettes a day. 04/05/22 ALS     Social History   Substance and Sexual Activity  Alcohol Use No     No Known  Allergies  Current Outpatient Medications  Medication Sig Dispense Refill   amLODipine (NORVASC) 5 MG tablet Take 5 mg by mouth daily.     aspirin EC 81 MG tablet Take 81 mg by mouth daily.     atorvastatin (LIPITOR) 10 MG tablet Take 10 mg by mouth daily.     losartan-hydrochlorothiazide (HYZAAR) 100-12.5 MG per tablet Take 1 tablet by mouth daily.     metoprolol succinate (TOPROL-XL) 25 MG 24 hr tablet Take 25 mg by mouth at bedtime.     Multiple Vitamin (MULTIVITAMIN) tablet Take 1 tablet by mouth daily.     tamsulosin (FLOMAX) 0.4 MG CAPS capsule Take 0.4 mg by mouth at bedtime.     varenicline (CHANTIX) 1 MG tablet Take by mouth.     No current facility-administered medications for this visit.    ROS   PHYSICAL EXAMINATION: There were no vitals taken for this visit. Physical Exam       I have independently reviewed the above radiology studies  and reviewed the findings with the patient.   Recent Lab Findings: Lab Results  Component Value Date   WBC 4.7 08/22/2023   HGB 15.1 08/22/2023   HCT 43.6 08/22/2023   PLT 217 08/22/2023   GLUCOSE 107 (H) 08/22/2023   ALT 20 08/22/2023   AST 37 08/22/2023   NA 137 08/22/2023   K 4.5 08/22/2023   CL 103 08/22/2023   CREATININE 1.21 08/22/2023   BUN 11 08/22/2023   CO2 26 08/22/2023   INR 1.08 05/03/2016   HGBA1C 5.7 (H) 10/10/2022    Diagnostic Studies & Laboratory data:     Recent Radiology Findings:   [facility] Chest Port 1 View Result Date: 08/08/2023 CLINICAL DATA:  Status post bronchoscopy with biopsy. EXAM: PORTABLE CHEST 1 VIEW COMPARISON:  March 22, 2022. FINDINGS: The heart size and mediastinal contours are within normal limits. Stable probable emphysematous disease seen in both upper lobes and bibasilar scarring. No definite acute abnormality seen. No definite pneumothorax or pleural effusion. The visualized skeletal structures are unremarkable. IMPRESSION: Stable chronic findings as noted above. No definite acute  abnormality seen. Electronically Signed   By: Lupita Raider M.D.   On: 08/08/2023 13:07   DG C-ARM BRONCHOSCOPY Result Date: 08/08/2023 C-ARM BRONCHOSCOPY: Fluoroscopy was utilized by the requesting physician.  No radiographic interpretation.     PFTs:  - FVC: ***% - FEV1: ***% -DLCO: ***%  FINAL MICROSCOPIC DIAGNOSIS:  A. LUNG, RML TARGET 1, FINE NEEDLE ASPIRATION  BIOPSIES:  - Adenocarcinoma   B. LUNG, RML TARGET 1, BRUSHING:  - Adenocarcinoma    Assessment / Plan:   73 yo male with 3.2cm RML adenocarcinoma.  Will need MRI brain.  PET results pending.  No evidence for distant disease on my read.  Good PFTs.  RMLectomy if not distant disease.  I  spent {CHL ONC TIME VISIT - VWUJW:[id number]} with  the patient face to face in counseling and coordination of care.    Corliss Skains 08/30/2023 9:31 AM

## 2023-09-01 ENCOUNTER — Encounter: Admitting: Thoracic Surgery (Cardiothoracic Vascular Surgery)

## 2023-09-11 ENCOUNTER — Other Ambulatory Visit: Payer: Self-pay | Admitting: Thoracic Surgery (Cardiothoracic Vascular Surgery)

## 2023-09-11 ENCOUNTER — Institutional Professional Consult (permissible substitution) (INDEPENDENT_AMBULATORY_CARE_PROVIDER_SITE_OTHER): Admitting: Thoracic Surgery (Cardiothoracic Vascular Surgery)

## 2023-09-11 ENCOUNTER — Telehealth: Payer: Self-pay | Admitting: Emergency Medicine

## 2023-09-11 ENCOUNTER — Encounter: Payer: Self-pay | Admitting: *Deleted

## 2023-09-11 ENCOUNTER — Other Ambulatory Visit: Payer: Self-pay | Admitting: *Deleted

## 2023-09-11 VITALS — BP 148/79 | HR 92 | Resp 20 | Ht 74.0 in | Wt 229.0 lb

## 2023-09-11 DIAGNOSIS — R911 Solitary pulmonary nodule: Secondary | ICD-10-CM

## 2023-09-11 NOTE — H&P (View-Only) (Signed)
 [address]       [place] [address]             [phone]                    JAIZEN ROBIN [facility] Medical Record #[id number] Date of Birth: [birth year]  Referring: Heilingoetter, Cassandr* Primary Care: [name], MD Primary Cardiologist: None  Chief Complaint:    Chief Complaint  Patient presents with   Lung Lesion    Chest CT 07/25/23/ Bronch 08/08/23/ PET scan 08/23/23/ PFT's 08/29/23    History of Present Illness:    Wyatt Berry 73 y.o. male presents for surgical evaluation of a biopsy-proven right middle lobe non-small cell lung cancer.  He does have a history of smoking, but has cut down to 1 to 2 cigarettes/day.  He denies any chest pain or shortness of breath.        Past Medical History:  Diagnosis Date   AAA (abdominal aortic aneurysm) without rupture (HCC) 01/01/2021   3.7 cm infrarenal AAA on PET 02/2022   Cancer [facility])    bladder   Cirrhosis of liver (HCC)    COPD (chronic obstructive pulmonary disease) (HCC)    Per patient mild   DVT (deep venous thrombosis) (HCC) 05/02/2016   subacute LLE DVT 05/02/16   Hepatitis C    HCV, s/p treatment   HLD (hyperlipidemia)    Hypertension    Lung nodule 02/23/2022   left lung   PE (pulmonary thromboembolism) (HCC) 05/02/2016   bilateral at least submassive PE with right heart strain, s/p Xarelto  x 4 years   Pre-diabetes 12/2020   no meds    Past Surgical History:  Procedure Laterality Date   BRONCHIAL BIOPSY  03/22/2022   Procedure: BRONCHIAL BIOPSIES;  Surgeon: Prudy Brownie, DO;  [facility]: [facility];  Service: Pulmonary;;   BRONCHIAL BIOPSY  08/08/2023   Procedure: BRONCHOSCOPY, WITH BIOPSY;  Surgeon: Denson Flake, MD;  Location: [facility];  Service: Pulmonary;;   BRONCHIAL BRUSHINGS  08/08/2023   Procedure: BRONCHOSCOPY, WITH BRUSH BIOPSY;  Surgeon: Denson Flake, MD;  Location: [facility];  Service: Pulmonary;;   BRONCHIAL NEEDLE ASPIRATION BIOPSY   03/22/2022   Procedure: BRONCHIAL NEEDLE ASPIRATION BIOPSIES;  Surgeon: Prudy Brownie, DO;  [facility]: [facility];  Service: Pulmonary;;   BRONCHIAL NEEDLE ASPIRATION BIOPSY  08/08/2023   Procedure: BRONCHOSCOPY, WITH NEEDLE ASPIRATION BIOPSY;  Surgeon: Denson Flake, MD;  [facility]: [facility];  Service: Pulmonary;;   COLONOSCOPY     CYSTOSCOPY W/ RETROGRADES Bilateral 06/14/2022   Procedure: CYSTOSCOPY WITH RETROGRADE PYELOGRAM;  Surgeon: Sherlyn Ditto, MD;  Location: [facility];  Service: Urology;  Laterality: Bilateral;   CYSTOSCOPY W/ RETROGRADES Bilateral 10/11/2022   Procedure: CYSTOSCOPY WITH RETROGRADE PYELOGRAM;  Surgeon: Sherlyn Ditto, MD;  Location: [facility];  Service: Urology;  Laterality: Bilateral;   TONSILLECTOMY     TRANSURETHRAL RESECTION OF BLADDER TUMOR N/A 06/14/2022   Procedure: TRANSURETHRAL RESECTION OF BLADDER TUMOR (TURBT), BLADDER BIOPSY;  Surgeon: Sherlyn Ditto, MD;  Location: [facility];  Service: Urology;  Laterality: N/A;  45 MINS   TRANSURETHRAL RESECTION OF BLADDER TUMOR N/A 10/11/2022   Procedure: TRANSURETHRAL RESECTION OF BLADDER TUMOR (TURBT);  Surgeon: Sherlyn Ditto, MD;  Location: [facility];  Service: Urology;  Laterality: N/A;  45 MINS   VIDEO BRONCHOSCOPY WITH ENDOBRONCHIAL NAVIGATION  08/08/2023   Procedure: VIDEO BRONCHOSCOPY WITH ENDOBRONCHIAL NAVIGATION;  Surgeon: Denson Flake, MD;  Location: [facility];  Service: Pulmonary;;   VIDEO BRONCHOSCOPY WITH ENDOBRONCHIAL ULTRASOUND Bilateral 03/22/2022   Procedure: VIDEO BRONCHOSCOPY WITH ENDOBRONCHIAL ULTRASOUND;  Surgeon: Prudy Brownie, DO;  [facility]: [facility];  Service: Pulmonary;  Laterality: Bilateral;    Family History  Problem Relation Age of Onset   Diabetes Mother    Congestive Heart Failure Mother    Heart disease Father      Social History   Tobacco Use  Smoking Status Some Days   Current packs/day: 0.25   Average packs/day: 0.3 packs/day for 52.3 years (13.1 ttl  pk-yrs)   Types: Cigarettes   Start date: 1973  Smokeless Tobacco Never  Tobacco Comments   Pt states that he smokes less than 5 cigarettes a day. 04/05/22 ALS     Social History   Substance and Sexual Activity  Alcohol Use No     No Known Allergies  Current Outpatient Medications  Medication Sig Dispense Refill   amLODipine  (NORVASC ) 5 MG tablet Take 5 mg by mouth daily.     aspirin EC 81 MG tablet Take 81 mg by mouth daily.     atorvastatin (LIPITOR) 10 MG tablet Take 10 mg by mouth daily.     losartan -hydrochlorothiazide  (HYZAAR) 100-12.5 MG per tablet Take 1 tablet by mouth daily.     metoprolol succinate (TOPROL-XL) 25 MG 24 hr tablet Take 25 mg by mouth at bedtime.     Multiple Vitamin (MULTIVITAMIN) tablet Take 1 tablet by mouth daily.     tamsulosin  (FLOMAX ) 0.4 MG CAPS capsule Take 0.4 mg by mouth at bedtime.     varenicline  (CHANTIX ) 1 MG tablet Take by mouth.     No current facility-administered medications for this visit.    Review of Systems  Constitutional:  Negative for malaise/fatigue and weight loss.  Respiratory:  Negative for cough and shortness of breath.   Cardiovascular:  Negative for chest pain.  Neurological:  Negative for dizziness.     PHYSICAL EXAMINATION: BP (!) 148/79   Pulse 92   Resp 20   Ht 6\' 2"  (1.88 m)   Wt 229 lb (103.9 kg)   SpO2 95% Comment: RA  BMI 29.40 kg/m  Physical Exam Constitutional:      General: He is not in acute distress.    Appearance: He is normal weight. He is not ill-appearing.  HENT:     Head: Normocephalic and atraumatic.  Eyes:     Extraocular Movements: Extraocular movements intact.  Cardiovascular:     Rate and Rhythm: Normal rate.  Pulmonary:     Effort: Pulmonary effort is normal. No respiratory distress.  Abdominal:     General: Abdomen is flat. There is no distension.  Musculoskeletal:        General: Normal range of motion.     Cervical back: Normal range of motion.  Skin:    General: Skin  is warm and dry.  Neurological:     General: No focal deficit present.     Mental Status: He is alert and oriented to person, place, and time.          I have independently reviewed the above radiology studies  and reviewed the findings with the patient.   Recent Lab Findings: Lab Results  Component Value Date   WBC 4.7 08/22/2023   HGB 15.1 08/22/2023   HCT 43.6 08/22/2023   PLT 217 08/22/2023   GLUCOSE 107 (H) 08/22/2023   ALT 20 08/22/2023  AST 37 08/22/2023   NA 137 08/22/2023   K 4.5 08/22/2023   CL 103 08/22/2023   CREATININE 1.21 08/22/2023   BUN 11 08/22/2023   CO2 26 08/22/2023   INR 1.08 05/03/2016   HGBA1C 5.7 (H) 10/10/2022    Diagnostic Studies & Laboratory data:     Recent Radiology Findings:   NM PET Image Initial (PI) Skull Base To Thigh Result Date: 08/31/2023 CLINICAL DATA:  Non-small-cell lung cancer staging EXAM: NUCLEAR MEDICINE PET SKULL BASE TO THIGH TECHNIQUE: 11.36 mCi F-18 FDG was injected intravenously. Full-ring PET imaging was performed from the skull base to thigh after the radiotracer. CT data was obtained and used for attenuation correction and anatomic localization. Fasting blood glucose: 100 mg/dl COMPARISON:  CT 96/08/5407. Older CT scans as well. PET-CT 03/09/2022. FINDINGS: Mediastinal blood pool activity: SUV max 3.0 Liver activity: SUV max 3.5 NECK: No abnormal uptake seen in the neck along limit changes of submandibular, posterior triangle or internal jugular region. Some physiologic pharyngeal uptake identified and muscular uptake along the muscles of mastication on the left. There is symmetric uptake along the visualized portions of the intracranial compartment. Incidental CT findings: Visualized portions of the paranasal sinuses and mastoid air cells are clear. Grossly the parotid glands, submandibular glands and thyroid  gland is unremarkable. Grossly no specific abnormal nodal enlargement identified on the noncontrast CT scan in the  neck. Vascular calcifications are identified. CHEST: The ill-defined nodular focus identified of a complex lesion in the middle lobe superior laterally has some uptake with maximum SUV 4.7. On the recent CT scan this measured 3.2 x 3.1 x 3.1 cm in 3 planes. On the old PET-CT area measured 2.8 x 1.7 cm in the axial plane with maximum SUV value of 3.2. Today area measures 3.2 by 2.9 cm on series 4, image 90. There is also a nodule in the left lower lobe on CT image 86 measuring 13 mm. In 2023 this measured 11 mm and does not show any abnormal radiotracer uptake. No specific abnormal uptake above mediastinal blood pool in the axillary region, hilum or mediastinum. Incidental CT findings: Scattered vascular calcifications identified along the thoracic aorta. Coronary artery calcifications are seen. Tiny pericardial effusion. Slightly patulous thoracic esophagus. ABDOMEN/PELVIS: There is physiologic distribution radiotracer along the parenchymal organs, bowel and renal collecting systems. Incidental CT findings: Nonspecific moderate perinephric stranding identified. No obstructing renal or ureteral stones. Enlarged prostate with mass effect along the base of the bladder. No abnormal uptake along the prostate. Bowel has a normal course and caliber with scattered stool the colon. Small hepatic cystic focus is identified. Spleen, adrenal glands and pancreas are grossly preserved. There is fusiform infrarenal abdominal aortic aneurysm identified measuring up to 4.1 by 3.8 cm today. Previously when measured in same fashion 3.9 by 3.7 cm in October 2023. SKELETON: No specific abnormal uptake along the visualized skeleton. Scattered degenerative changes seen throughout the spine and pelvis. IMPRESSION: Mild abnormal radiotracer uptake corresponding to the complex lesion in the middle lobe. This has been slowly increasing in size as well. Low-grade malignancy is possible. Recommend further evaluation. Small left lower lobe lung  nodule is similar in size going back to 2023 and does not show abnormal uptake. Recommend continued surveillance. Minimal increase in size of the 4.1 cm in for abdominal aortic aneurysm. Recommend follow-up CT or MR as appropriate in 12 months and referral to or continued care with vascular specialist. (Ref.: J Vasc Surg. 2018; 67:2-77 and J Am Coll Radiol  2013;10(10):789-794.) Electronically Signed   By: Adrianna Horde M.D.   On: 08/31/2023 12:07     PFTs:  - FVC: 74% - FEV1: 63% -DLCO: 61%   FINAL MICROSCOPIC DIAGNOSIS:  A. LUNG, RML TARGET 1, FINE NEEDLE ASPIRATION  BIOPSIES:  - Adenocarcinoma   B. LUNG, RML TARGET 1, BRUSHING:  - Adenocarcinoma    Assessment / Plan:   73 yo male with 3.2cm RML adenocarcinoma.  PET/CT does not show any distant disease.  His pulmonary functions are adequate.  We discussed the risks and benefits of a right robotic assisted thoracoscopy with right middle lobectomy.  He will completely stop smoking today.  He is agreeable to proceed.      I  spent 40 minutes with  the patient face to face in counseling and coordination of care.    Hilarie Lovely 09/11/2023 1:41 PM

## 2023-09-11 NOTE — Progress Notes (Signed)
 [address]       [place] [address]             [phone]                    [facility] Medical Record #[id number] Date of Birth: [birth year]  Referring: Heilingoetter, Cassandr* Primary Care: [name], MD Primary Cardiologist: None  Chief Complaint:    Chief Complaint  Patient presents with   Lung Lesion    Chest CT 07/25/23/ Bronch 08/08/23/ PET scan 08/23/23/ PFT's 08/29/23    History of Present Illness:    Wyatt Berry 73 y.o. male presents for surgical evaluation of a biopsy-proven right middle lobe non-small cell lung cancer.  He does have a history of smoking, but has cut down to 1 to 2 cigarettes/day.  He denies any chest pain or shortness of breath.        Past Medical History:  Diagnosis Date   AAA (abdominal aortic aneurysm) without rupture (HCC) 01/01/2021   3.7 cm infrarenal AAA on PET 02/2022   Cancer [facility])    bladder   Cirrhosis of liver (HCC)    COPD (chronic obstructive pulmonary disease) (HCC)    Per patient mild   DVT (deep venous thrombosis) (HCC) 05/02/2016   subacute LLE DVT 05/02/16   Hepatitis C    HCV, s/p treatment   HLD (hyperlipidemia)    Hypertension    Lung nodule 02/23/2022   left lung   PE (pulmonary thromboembolism) (HCC) 05/02/2016   bilateral at least submassive PE with right heart strain, s/p Xarelto  x 4 years   Pre-diabetes 12/2020   no meds    Past Surgical History:  Procedure Laterality Date   BRONCHIAL BIOPSY  03/22/2022   Procedure: BRONCHIAL BIOPSIES;  Surgeon: Prudy Brownie, DO;  Location: [facility] ENDOSCOPY;  Service: Pulmonary;;   BRONCHIAL BIOPSY  08/08/2023   Procedure: BRONCHOSCOPY, WITH BIOPSY;  Surgeon: Denson Flake, MD;  Location: [facility];  Service: Pulmonary;;   BRONCHIAL BRUSHINGS  08/08/2023   Procedure: BRONCHOSCOPY, WITH BRUSH BIOPSY;  Surgeon: Denson Flake, MD;  Location: [facility];  Service: Pulmonary;;   BRONCHIAL NEEDLE ASPIRATION BIOPSY   03/22/2022   Procedure: BRONCHIAL NEEDLE ASPIRATION BIOPSIES;  Surgeon: Prudy Brownie, DO;  Location: [facility];  Service: Pulmonary;;   BRONCHIAL NEEDLE ASPIRATION BIOPSY  08/08/2023   Procedure: BRONCHOSCOPY, WITH NEEDLE ASPIRATION BIOPSY;  Surgeon: Denson Flake, MD;  Location: [facility];  Service: Pulmonary;;   COLONOSCOPY     CYSTOSCOPY W/ RETROGRADES Bilateral 06/14/2022   Procedure: CYSTOSCOPY WITH RETROGRADE PYELOGRAM;  Surgeon: Sherlyn Ditto, MD;  Location: [facility];  Service: Urology;  Laterality: Bilateral;   CYSTOSCOPY W/ RETROGRADES Bilateral 10/11/2022   Procedure: CYSTOSCOPY WITH RETROGRADE PYELOGRAM;  Surgeon: Sherlyn Ditto, MD;  Location: [facility];  Service: Urology;  Laterality: Bilateral;   TONSILLECTOMY     TRANSURETHRAL RESECTION OF BLADDER TUMOR N/A 06/14/2022   Procedure: TRANSURETHRAL RESECTION OF BLADDER TUMOR (TURBT), BLADDER BIOPSY;  Surgeon: Sherlyn Ditto, MD;  Location: [facility];  Service: Urology;  Laterality: N/A;  45 MINS   TRANSURETHRAL RESECTION OF BLADDER TUMOR N/A 10/11/2022   Procedure: TRANSURETHRAL RESECTION OF BLADDER TUMOR (TURBT);  Surgeon: Sherlyn Ditto, MD;  Location: [facility];  Service: Urology;  Laterality: N/A;  45 MINS   VIDEO BRONCHOSCOPY WITH ENDOBRONCHIAL NAVIGATION  08/08/2023   Procedure: VIDEO BRONCHOSCOPY WITH ENDOBRONCHIAL NAVIGATION;  Surgeon: Denson Flake, MD;  Location: [facility] ENDOSCOPY;  Service: Pulmonary;;   VIDEO BRONCHOSCOPY WITH ENDOBRONCHIAL ULTRASOUND Bilateral 03/22/2022   Procedure: VIDEO BRONCHOSCOPY WITH ENDOBRONCHIAL ULTRASOUND;  Surgeon: Prudy Brownie, DO;  Location: [facility];  Service: Pulmonary;  Laterality: Bilateral;    Family History  Problem Relation Age of Onset   Diabetes Mother    Congestive Heart Failure Mother    Heart disease Father      Social History   Tobacco Use  Smoking Status Some Days   Current packs/day: 0.25   Average packs/day: 0.3 packs/day for 52.3 years (13.1 ttl  pk-yrs)   Types: Cigarettes   Start date: 1973  Smokeless Tobacco Never  Tobacco Comments   Pt states that he smokes less than 5 cigarettes a day. 04/05/22 ALS     Social History   Substance and Sexual Activity  Alcohol Use No     No Known Allergies  Current Outpatient Medications  Medication Sig Dispense Refill   amLODipine  (NORVASC ) 5 MG tablet Take 5 mg by mouth daily.     aspirin EC 81 MG tablet Take 81 mg by mouth daily.     atorvastatin (LIPITOR) 10 MG tablet Take 10 mg by mouth daily.     losartan -hydrochlorothiazide  (HYZAAR) 100-12.5 MG per tablet Take 1 tablet by mouth daily.     metoprolol succinate (TOPROL-XL) 25 MG 24 hr tablet Take 25 mg by mouth at bedtime.     Multiple Vitamin (MULTIVITAMIN) tablet Take 1 tablet by mouth daily.     tamsulosin  (FLOMAX ) 0.4 MG CAPS capsule Take 0.4 mg by mouth at bedtime.     varenicline  (CHANTIX ) 1 MG tablet Take by mouth.     No current facility-administered medications for this visit.    Review of Systems  Constitutional:  Negative for malaise/fatigue and weight loss.  Respiratory:  Negative for cough and shortness of breath.   Cardiovascular:  Negative for chest pain.  Neurological:  Negative for dizziness.     PHYSICAL EXAMINATION: BP (!) 148/79   Pulse 92   Resp 20   Ht 6\' 2"  (1.88 m)   Wt 229 lb (103.9 kg)   SpO2 95% Comment: RA  BMI 29.40 kg/m  Physical Exam Constitutional:      General: He is not in acute distress.    Appearance: He is normal weight. He is not ill-appearing.  HENT:     Head: Normocephalic and atraumatic.  Eyes:     Extraocular Movements: Extraocular movements intact.  Cardiovascular:     Rate and Rhythm: Normal rate.  Pulmonary:     Effort: Pulmonary effort is normal. No respiratory distress.  Abdominal:     General: Abdomen is flat. There is no distension.  Musculoskeletal:        General: Normal range of motion.     Cervical back: Normal range of motion.  Skin:    General: Skin  is warm and dry.  Neurological:     General: No focal deficit present.     Mental Status: He is alert and oriented to person, place, and time.          I have independently reviewed the above radiology studies  and reviewed the findings with the patient.   Recent Lab Findings: Lab Results  Component Value Date   WBC 4.7 08/22/2023   HGB 15.1 08/22/2023   HCT 43.6 08/22/2023   PLT 217 08/22/2023   GLUCOSE 107 (H) 08/22/2023   ALT 20 08/22/2023  AST 37 08/22/2023   NA 137 08/22/2023   K 4.5 08/22/2023   CL 103 08/22/2023   CREATININE 1.21 08/22/2023   BUN 11 08/22/2023   CO2 26 08/22/2023   INR 1.08 05/03/2016   HGBA1C 5.7 (H) 10/10/2022    Diagnostic Studies & Laboratory data:     Recent Radiology Findings:   NM PET Image Initial (PI) Skull Base To Thigh Result Date: 08/31/2023 CLINICAL DATA:  Non-small-cell lung cancer staging EXAM: NUCLEAR MEDICINE PET SKULL BASE TO THIGH TECHNIQUE: 11.36 mCi F-18 FDG was injected intravenously. Full-ring PET imaging was performed from the skull base to thigh after the radiotracer. CT data was obtained and used for attenuation correction and anatomic localization. Fasting blood glucose: 100 mg/dl COMPARISON:  CT 96/08/5407. Older CT scans as well. PET-CT 03/09/2022. FINDINGS: Mediastinal blood pool activity: SUV max 3.0 Liver activity: SUV max 3.5 NECK: No abnormal uptake seen in the neck along limit changes of submandibular, posterior triangle or internal jugular region. Some physiologic pharyngeal uptake identified and muscular uptake along the muscles of mastication on the left. There is symmetric uptake along the visualized portions of the intracranial compartment. Incidental CT findings: Visualized portions of the paranasal sinuses and mastoid air cells are clear. Grossly the parotid glands, submandibular glands and thyroid  gland is unremarkable. Grossly no specific abnormal nodal enlargement identified on the noncontrast CT scan in the  neck. Vascular calcifications are identified. CHEST: The ill-defined nodular focus identified of a complex lesion in the middle lobe superior laterally has some uptake with maximum SUV 4.7. On the recent CT scan this measured 3.2 x 3.1 x 3.1 cm in 3 planes. On the old PET-CT area measured 2.8 x 1.7 cm in the axial plane with maximum SUV value of 3.2. Today area measures 3.2 by 2.9 cm on series 4, image 90. There is also a nodule in the left lower lobe on CT image 86 measuring 13 mm. In 2023 this measured 11 mm and does not show any abnormal radiotracer uptake. No specific abnormal uptake above mediastinal blood pool in the axillary region, hilum or mediastinum. Incidental CT findings: Scattered vascular calcifications identified along the thoracic aorta. Coronary artery calcifications are seen. Tiny pericardial effusion. Slightly patulous thoracic esophagus. ABDOMEN/PELVIS: There is physiologic distribution radiotracer along the parenchymal organs, bowel and renal collecting systems. Incidental CT findings: Nonspecific moderate perinephric stranding identified. No obstructing renal or ureteral stones. Enlarged prostate with mass effect along the base of the bladder. No abnormal uptake along the prostate. Bowel has a normal course and caliber with scattered stool the colon. Small hepatic cystic focus is identified. Spleen, adrenal glands and pancreas are grossly preserved. There is fusiform infrarenal abdominal aortic aneurysm identified measuring up to 4.1 by 3.8 cm today. Previously when measured in same fashion 3.9 by 3.7 cm in October 2023. SKELETON: No specific abnormal uptake along the visualized skeleton. Scattered degenerative changes seen throughout the spine and pelvis. IMPRESSION: Mild abnormal radiotracer uptake corresponding to the complex lesion in the middle lobe. This has been slowly increasing in size as well. Low-grade malignancy is possible. Recommend further evaluation. Small left lower lobe lung  nodule is similar in size going back to 2023 and does not show abnormal uptake. Recommend continued surveillance. Minimal increase in size of the 4.1 cm in for abdominal aortic aneurysm. Recommend follow-up CT or MR as appropriate in 12 months and referral to or continued care with vascular specialist. (Ref.: J Vasc Surg. 2018; 67:2-77 and J Am Coll Radiol  2013;10(10):789-794.) Electronically Signed   By: Adrianna Horde M.D.   On: 08/31/2023 12:07     PFTs:  - FVC: 74% - FEV1: 63% -DLCO: 61%   FINAL MICROSCOPIC DIAGNOSIS:  A. LUNG, RML TARGET 1, FINE NEEDLE ASPIRATION  BIOPSIES:  - Adenocarcinoma   B. LUNG, RML TARGET 1, BRUSHING:  - Adenocarcinoma    Assessment / Plan:   73 yo male with 3.2cm RML adenocarcinoma.  PET/CT does not show any distant disease.  His pulmonary functions are adequate.  We discussed the risks and benefits of a right robotic assisted thoracoscopy with right middle lobectomy.  He will completely stop smoking today.  He is agreeable to proceed.      I  spent 40 minutes with  the patient face to face in counseling and coordination of care.    Hilarie Lovely 09/11/2023 1:41 PM

## 2023-09-19 ENCOUNTER — Telehealth (HOSPITAL_COMMUNITY): Payer: Self-pay

## 2023-09-19 ENCOUNTER — Telehealth: Payer: Self-pay | Admitting: *Deleted

## 2023-09-19 NOTE — Telephone Encounter (Signed)
 Patient called stating he just wanted to check to see if he would make his other appt the same day. [facility]

## 2023-09-19 NOTE — Telephone Encounter (Signed)
 Left detailed instructions for MPI study.

## 2023-09-20 ENCOUNTER — Other Ambulatory Visit: Payer: Self-pay | Admitting: Thoracic Surgery (Cardiothoracic Vascular Surgery)

## 2023-09-20 DIAGNOSIS — R911 Solitary pulmonary nodule: Secondary | ICD-10-CM

## 2023-09-20 NOTE — Progress Notes (Signed)
 Surgical Instructions    Your procedure is scheduled on Sep 25, 2023.  Report to [facility] Main Entrance "A" at 9:00 A.M., then check in with the Admitting office.  Call this number if you have problems the morning of surgery:  [phone]  If you have any questions prior to your surgery date call [phone]: Open Monday-Friday 8am-4pm If you experience any cold or flu symptoms such as cough, fever, chills, shortness of breath, etc. between now and your scheduled surgery, please notify us  at the above number.     Remember:  Do not eat or drink after midnight the night before your surgery      Take these medicines the morning of surgery with A SIP OF WATER   amLODipine  (NORVASC )  atorvastatin (LIPITOR)   Aspirin follow instructions given to you by your surgeon  As of today, STOP taking any Aspirin (unless otherwise instructed by your surgeon) Aleve, Naproxen, Ibuprofen, Motrin, Advil, Goody's, BC's, all herbal medications, fish oil, and all vitamins.                     Do NOT Smoke (Tobacco/Vaping) for 24 hours prior to your procedure.  If you use a CPAP at night, you may bring your mask/headgear for your overnight stay.   Contacts, glasses, piercing's, hearing aid's, dentures or partials may not be worn into surgery, please bring cases for these belongings.    For patients admitted to the hospital, discharge time will be determined by your treatment team.   Patients discharged the day of surgery will not be allowed to drive home, and someone needs to stay with them for 24 hours.  SURGICAL WAITING ROOM VISITATION Patients having surgery or a procedure may have no more than 2 support people in the waiting area - these visitors may rotate.   Children under the age of 70 must have an adult with them who is not the patient. If the patient needs to stay at the hospital during part of their recovery, the visitor guidelines for inpatient rooms apply. Pre-op nurse will coordinate an  appropriate time for 1 support person to accompany patient in pre-op.  This support person may not rotate.   Please refer to the [facility] website for the visitor guidelines for Inpatients (after your surgery is over and you are in a regular room).    Special instructions:   [place]- Preparing For Surgery  Before surgery, you can play an important role. Because skin is not sterile, your skin needs to be as free of germs as possible. You can reduce the number of germs on your skin by washing with CHG (chlorahexidine gluconate) Soap before surgery.  CHG is an antiseptic cleaner which kills germs and bonds with the skin to continue killing germs even after washing.    Oral Hygiene is also important to reduce your risk of infection.  Remember - BRUSH YOUR TEETH THE MORNING OF SURGERY WITH YOUR REGULAR TOOTHPASTE  Please do not use if you have an allergy to CHG or antibacterial soaps. If your skin becomes reddened/irritated stop using the CHG.  Do not shave (including legs and underarms) for at least 48 hours prior to first CHG shower. It is OK to shave your face.  Please follow these instructions carefully.   Shower the NIGHT BEFORE SURGERY and the MORNING OF SURGERY  If you chose to wash your hair, wash your hair first as usual with your normal shampoo.  After you shampoo, rinse your hair  and body thoroughly to remove the shampoo.  Use CHG Soap as you would any other liquid soap. You can apply CHG directly to the skin and wash gently with a scrungie or a clean washcloth.   Apply the CHG Soap to your body ONLY FROM THE NECK DOWN.  Do not use on open wounds or open sores. Avoid contact with your eyes, ears, mouth and genitals (private parts). Wash Face and genitals (private parts)  with your normal soap.   Wash thoroughly, paying special attention to the area where your surgery will be performed.  Thoroughly rinse your body with warm water  from the neck down.  DO NOT shower/wash with  your normal soap after using and rinsing off the CHG Soap.  Pat yourself dry with a CLEAN TOWEL.  Wear CLEAN PAJAMAS to bed the night before surgery  Place CLEAN SHEETS on your bed the night before your surgery  DO NOT SLEEP WITH PETS.   Day of Surgery: Take a shower with CHG soap. Do not wear jewelry or makeup Do not wear lotions, powders, perfumes/colognes, or deodorant. Do not shave 48 hours prior to surgery.  Men may shave face and neck. Do not bring valuables to the hospital.  [facility] is not responsible for any belongings or valuables. Do not wear nail polish, gel polish, artificial nails, or any other type of covering on natural nails (fingers and toes) If you have artificial nails or gel coating that need to be removed by a nail salon, please have this removed prior to surgery. Artificial nails or gel coating may interfere with anesthesia's ability to adequately monitor your vital signs. Wear Clean/Comfortable clothing the morning of surgery Remember to brush your teeth WITH YOUR REGULAR TOOTHPASTE.   Please read over the following fact sheets that you were given.    If you received a COVID test during your pre-op visit  it is requested that you wear a mask when out in public, stay away from anyone that may not be feeling well and notify your surgeon if you develop symptoms. If you have been in contact with anyone that has tested positive in the last 10 days please notify you surgeon.

## 2023-09-21 ENCOUNTER — Ambulatory Visit (HOSPITAL_COMMUNITY)

## 2023-09-21 ENCOUNTER — Ambulatory Visit (HOSPITAL_COMMUNITY)
Admission: RE | Admit: 2023-09-21 | Discharge: 2023-09-21 | Disposition: A | Discharge status: Home / Self Care | Source: Ambulatory Visit | Attending: Thoracic Surgery (Cardiothoracic Vascular Surgery) | Admitting: Thoracic Surgery (Cardiothoracic Vascular Surgery)

## 2023-09-21 ENCOUNTER — Other Ambulatory Visit: Payer: Self-pay

## 2023-09-21 ENCOUNTER — Encounter (HOSPITAL_COMMUNITY)
Admission: RE | Admit: 2023-09-21 | Discharge: 2023-09-21 | Disposition: A | Discharge status: Home / Self Care | Source: Ambulatory Visit | Attending: Thoracic Surgery (Cardiothoracic Vascular Surgery) | Admitting: Thoracic Surgery (Cardiothoracic Vascular Surgery)

## 2023-09-21 ENCOUNTER — Encounter (HOSPITAL_COMMUNITY): Payer: Self-pay

## 2023-09-21 VITALS — BP 156/88 | HR 74 | Temp 97.7°F | Resp 17 | Ht 73.0 in | Wt 227.2 lb

## 2023-09-21 DIAGNOSIS — I10 Essential (primary) hypertension: Secondary | ICD-10-CM | POA: Insufficient documentation

## 2023-09-21 DIAGNOSIS — Z86718 Personal history of other venous thrombosis and embolism: Secondary | ICD-10-CM | POA: Insufficient documentation

## 2023-09-21 DIAGNOSIS — Z86711 Personal history of pulmonary embolism: Secondary | ICD-10-CM | POA: Insufficient documentation

## 2023-09-21 DIAGNOSIS — R911 Solitary pulmonary nodule: Secondary | ICD-10-CM

## 2023-09-21 DIAGNOSIS — I491 Atrial premature depolarization: Secondary | ICD-10-CM | POA: Insufficient documentation

## 2023-09-21 DIAGNOSIS — E7800 Pure hypercholesterolemia, unspecified: Secondary | ICD-10-CM | POA: Insufficient documentation

## 2023-09-21 DIAGNOSIS — I2510 Atherosclerotic heart disease of native coronary artery without angina pectoris: Secondary | ICD-10-CM | POA: Insufficient documentation

## 2023-09-21 DIAGNOSIS — J449 Chronic obstructive pulmonary disease, unspecified: Secondary | ICD-10-CM | POA: Insufficient documentation

## 2023-09-21 DIAGNOSIS — K7460 Unspecified cirrhosis of liver: Secondary | ICD-10-CM | POA: Diagnosis not present

## 2023-09-21 DIAGNOSIS — C679 Malignant neoplasm of bladder, unspecified: Secondary | ICD-10-CM | POA: Insufficient documentation

## 2023-09-21 DIAGNOSIS — I714 Abdominal aortic aneurysm, without rupture, unspecified: Secondary | ICD-10-CM | POA: Insufficient documentation

## 2023-09-21 DIAGNOSIS — R918 Other nonspecific abnormal finding of lung field: Secondary | ICD-10-CM | POA: Diagnosis not present

## 2023-09-21 DIAGNOSIS — F17200 Nicotine dependence, unspecified, uncomplicated: Secondary | ICD-10-CM | POA: Diagnosis not present

## 2023-09-21 DIAGNOSIS — Z01818 Encounter for other preprocedural examination: Secondary | ICD-10-CM | POA: Insufficient documentation

## 2023-09-21 DIAGNOSIS — I700 Atherosclerosis of aorta: Secondary | ICD-10-CM | POA: Diagnosis not present

## 2023-09-21 DIAGNOSIS — Z01810 Encounter for preprocedural cardiovascular examination: Secondary | ICD-10-CM | POA: Diagnosis not present

## 2023-09-21 LAB — CBC
HCT: 45.2 % (ref 39.0–52.0)
Hemoglobin: 15.3 g/dL (ref 13.0–17.0)
MCH: 30.2 pg (ref 26.0–34.0)
MCHC: 33.8 g/dL (ref 30.0–36.0)
MCV: 89.3 fL (ref 80.0–100.0)
Platelets: 224 10*3/uL (ref 150–400)
RBC: 5.06 MIL/uL (ref 4.22–5.81)
RDW: 14.6 % (ref 11.5–15.5)
WBC: 4.7 10*3/uL (ref 4.0–10.5)
nRBC: 0 % (ref 0.0–0.2)

## 2023-09-21 LAB — COMPREHENSIVE METABOLIC PANEL WITH GFR
ALT: 21 U/L (ref 0–44)
AST: 28 U/L (ref 15–41)
Albumin: 3.9 g/dL (ref 3.5–5.0)
Alkaline Phosphatase: 58 U/L (ref 38–126)
Anion gap: 10 (ref 5–15)
BUN: 10 mg/dL (ref 8–23)
CO2: 23 mmol/L (ref 22–32)
Calcium: 9.4 mg/dL (ref 8.9–10.3)
Chloride: 103 mmol/L (ref 98–111)
Creatinine, Ser: 1.1 mg/dL (ref 0.61–1.24)
GFR, Estimated: >60 mL/min (ref >60)
Glucose, Bld: 121 mg/dL — ABNORMAL HIGH (ref 70–99)
Potassium: 3.8 mmol/L (ref 3.5–5.1)
Sodium: 136 mmol/L (ref 135–145)
Total Bilirubin: 0.8 mg/dL (ref 0.0–1.2)
Total Protein: 7.8 g/dL (ref 6.5–8.1)

## 2023-09-21 LAB — URINALYSIS, ROUTINE W REFLEX MICROSCOPIC
Bilirubin Urine: NEGATIVE
Glucose, UA: NEGATIVE mg/dL
Hgb urine dipstick: NEGATIVE
Ketones, ur: NEGATIVE mg/dL
Leukocytes,Ua: NEGATIVE
Nitrite: NEGATIVE
Protein, ur: NEGATIVE mg/dL
Specific Gravity, Urine: 1.006 (ref 1.005–1.030)
pH: 6 (ref 5.0–8.0)

## 2023-09-21 LAB — SURGICAL PCR SCREEN
MRSA, PCR: NEGATIVE
Staphylococcus aureus: NEGATIVE

## 2023-09-21 LAB — PROTIME-INR
INR: 1 (ref 0.8–1.2)
Prothrombin Time: 13.6 s (ref 11.4–15.2)

## 2023-09-21 LAB — APTT: aPTT: 29 s (ref 24–36)

## 2023-09-21 MED ORDER — TECHNETIUM TC 99M TETROFOSMIN IV KIT
24.9000 | PACK | Freq: Once | INTRAVENOUS | Status: AC | PRN
  Administered 2023-09-21: 24.9 via INTRAVENOUS

## 2023-09-21 MED ORDER — TECHNETIUM TC 99M TETROFOSMIN IV KIT
8.2000 | PACK | Freq: Once | INTRAVENOUS | Status: AC | PRN
Start: 2023-09-21 — End: 2023-09-21
  Administered 2023-09-21: 8.2 via INTRAVENOUS

## 2023-09-21 MED ORDER — REGADENOSON 0.4 MG/5ML IV SOLN
INTRAVENOUS | Status: AC
  Filled 2023-09-21: qty 5

## 2023-09-21 MED ORDER — REGADENOSON 0.4 MG/5ML IV SOLN
0.4000 mg | Freq: Once | INTRAVENOUS | Status: AC
  Administered 2023-09-21: 0.4 mg via INTRAVENOUS

## 2023-09-21 NOTE — Progress Notes (Signed)
 Surgical Instructions    Your procedure is scheduled on Sep 25, 2023.  Report to [facility] Main Entrance "A" at 9:00 A.M., then check in with the Admitting office.  Call this number if you have problems the morning of surgery:  [phone]  If you have any questions prior to your surgery date call [phone]: Open Monday-Friday 8am-4pm If you experience any cold or flu symptoms such as cough, fever, chills, shortness of breath, etc. between now and your scheduled surgery, please notify us  at the above number.     Remember:  Do not eat or drink after midnight the night before your surgery      Take these medicines the morning of surgery with A SIP OF WATER   amLODipine  (NORVASC )  atorvastatin (LIPITOR)   Aspirin follow instructions given to you by your surgeon. If no instructions were given you must call the office.   ONE WEEK PRIOR TO SURGERY,STOP taking any Aleve, Naproxen, Ibuprofen, Motrin, Advil, Goody's, BC's, all herbal medications, fish oil, and all vitamins.                     Do NOT Smoke (Tobacco/Vaping) for 24 hours prior to your procedure.  If you use a CPAP at night, you may bring your mask/headgear for your overnight stay.   Contacts, glasses, piercing's, hearing aid's, dentures or partials may not be worn into surgery, please bring cases for these belongings.    For patients admitted to the hospital, discharge time will be determined by your treatment team.   Patients discharged the day of surgery will not be allowed to drive home, and someone needs to stay with them for 24 hours.  SURGICAL WAITING ROOM VISITATION Patients having surgery or a procedure may have no more than 2 support people in the waiting area - these visitors may rotate.   Children under the age of 15 must have an adult with them who is not the patient. If the patient needs to stay at the hospital during part of their recovery, the visitor guidelines for inpatient rooms apply. Pre-op nurse  will coordinate an appropriate time for 1 support person to accompany patient in pre-op.  This support person may not rotate.   Please refer to the [facility] website for the visitor guidelines for Inpatients (after your surgery is over and you are in a regular room).      Pre-operative CHG Bathing Instructions   You can play a key role in reducing the risk of infection after surgery. Your skin needs to be as free of germs as possible. You can reduce the number of germs on your skin by washing with CHG (chlorhexidine  gluconate) soap before surgery. CHG is an antiseptic soap that kills germs and continues to kill germs even after washing.   DO NOT use if you have an allergy to chlorhexidine /CHG or antibacterial soaps. If your skin becomes reddened or irritated, stop using the CHG and notify one of our RNs at [phone].              TAKE A SHOWER THE NIGHT BEFORE SURGERY AND THE DAY OF SURGERY    Please keep in mind the following:  DO NOT shave, including legs and underarms, 48 hours prior to surgery.   You may shave your face before/day of surgery.  Place clean sheets on your bed the night before surgery Use a clean washcloth (not used since being washed) for each shower. DO NOT sleep with pet's night before  surgery.  CHG Shower Instructions:  Wash your face and private area with normal soap. If you choose to wash your hair, wash first with your normal shampoo.  After you use shampoo/soap, rinse your hair and body thoroughly to remove shampoo/soap residue.  Turn the water  OFF and apply half the bottle of CHG soap to a CLEAN washcloth.  Apply CHG soap ONLY FROM YOUR NECK DOWN TO YOUR TOES (washing for 3-5 minutes)  DO NOT use CHG soap on face, private areas, open wounds, or sores.  Pay special attention to the area where your surgery is being performed.  If you are having back surgery, having someone wash your back for you may be helpful. Wait 2 minutes after CHG soap is applied, then  you may rinse off the CHG soap.  Pat dry with a clean towel  Put on clean pajamas    Additional instructions for the day of surgery: DO NOT APPLY any lotions, deodorants, cologne, or perfumes.   Do not wear jewelry or makeup Do not wear nail polish, gel polish, artificial nails, or any other type of covering on natural nails (fingers and toes) Do not bring valuables to the hospital. [facility] is not responsible for valuables/personal belongings. Put on clean/comfortable clothes.  Please brush your teeth.  Ask your nurse before applying any prescription medications to the skin.    Special instructions:   [place]- Preparing For Surgery  Before surgery, you can play an important role. Because skin is not sterile, your skin needs to be as free of germs as possible. You can reduce the number of germs on your skin by washing with CHG (chlorahexidine gluconate) Soap before surgery.  CHG is an antiseptic cleaner which kills germs and bonds with the skin to continue killing germs even after washing.    Oral Hygiene is also important to reduce your risk of infection.  Remember - BRUSH YOUR TEETH THE MORNING OF SURGERY WITH YOUR REGULAR TOOTHPASTE  Please do not use if you have an allergy to CHG or antibacterial soaps. If your skin becomes reddened/irritated stop using the CHG.  Do not shave (including legs and underarms) for at least 48 hours prior to first CHG shower. It is OK to shave your face.  Please follow these instructions carefully.   Shower the NIGHT BEFORE SURGERY and the MORNING OF SURGERY  If you chose to wash your hair, wash your hair first as usual with your normal shampoo.  After you shampoo, rinse your hair and body thoroughly to remove the shampoo.  Use CHG Soap as you would any other liquid soap. You can apply CHG directly to the skin and wash gently with a scrungie or a clean washcloth.   Apply the CHG Soap to your body ONLY FROM THE NECK DOWN.  Do not use on open  wounds or open sores. Avoid contact with your eyes, ears, mouth and genitals (private parts). Wash Face and genitals (private parts)  with your normal soap.   Wash thoroughly, paying special attention to the area where your surgery will be performed.  Thoroughly rinse your body with warm water  from the neck down.  DO NOT shower/wash with your normal soap after using and rinsing off the CHG Soap.  Pat yourself dry with a CLEAN TOWEL.  Wear CLEAN PAJAMAS to bed the night before surgery  Place CLEAN SHEETS on your bed the night before your surgery  DO NOT SLEEP WITH PETS.   Day of Surgery: Take a  shower with CHG soap. Do not wear jewelry or makeup Do not wear lotions, powders, perfumes/colognes, or deodorant. Do not shave 48 hours prior to surgery.  Men may shave face and neck. Do not bring valuables to the hospital.  [facility] is not responsible for any belongings or valuables. Do not wear nail polish, gel polish, artificial nails, or any other type of covering on natural nails (fingers and toes) If you have artificial nails or gel coating that need to be removed by a nail salon, please have this removed prior to surgery. Artificial nails or gel coating may interfere with anesthesia's ability to adequately monitor your vital signs. Wear Clean/Comfortable clothing the morning of surgery Remember to brush your teeth WITH YOUR REGULAR TOOTHPASTE.   Please read over the following fact sheets that you were given.    If you received a COVID test during your pre-op visit  it is requested that you wear a mask when out in public, stay away from anyone that may not be feeling well and notify your surgeon if you develop symptoms. If you have been in contact with anyone that has tested positive in the last 10 days please notify you surgeon.

## 2023-09-21 NOTE — Progress Notes (Signed)
 PCP - Dorethea Ganong Cardiologist - denies Pulmonologist - Emilio Harder  PPM/ICD - denies Device Orders -  Rep Notified -   Chest x-ray - 09/21/23 EKG - 09/21/23 Stress Test - 09/21/23 ECHO - 05/02/16 Cardiac Cath - denies  Sleep Study - denies CPAP -   Fasting Blood Sugar - na Checks Blood Sugar _____ times a day  Last dose of GLP1 agonist-  na GLP1 instructions:   Blood Thinner Instructions: na Aspirin Instructions:hold DOS  ERAS Protcol -no PRE-SURGERY Ensure or G2-   COVID TEST- na   Anesthesia review: yes- hx AAA,PE,COPD  Patient denies shortness of breath, fever, cough and chest pain at PAT appointment   All instructions explained to the patient, with a verbal understanding of the material. Patient agrees to go over the instructions while at home for a better understanding.  The opportunity to ask questions was provided.

## 2023-09-22 LAB — MYOCARDIAL PERFUSION IMAGING
Nuc Stress EF: 67 %
Peak HR: 90 {beats}/min
Rest HR: 61 {beats}/min
Rest Nuclear Isotope Dose: 8.2 mCi
ST Depression (mm): 0 mm
Stress Nuclear Isotope Dose: 24.9 mCi
TID: 0.98

## 2023-09-22 NOTE — Progress Notes (Signed)
 Anesthesia Chart Review:  74 yo male with pertinent hx including DVT/PE 2017, HCV s/p treatment, current smoker with mild COPD, Cirrhosis, AAA (4.1 cm by PET scan 08/2023), bladder cancer.   PET scan performed in October 2023 showed known right middle lobe nodule was hypermetabolic and suspicious for bronchogenic carcinoma.  There was another 11 mm nodule in the left lower lobe that did not have any hypermetabolic activity which was suggestive of benign granuloma.  It was monitored and stable on scans from March 2024 and September 2024 but the scan from 07/2023 showed stable to increased in size in the right middle lobe lesion. Recent bronchoscopy 08/08/23 showed adenocarcinoma.   Seen by Dr. Deloise Ferries 09/11/23. Robotic assisted right middle lobectomy recommended. Dr. Deloise Ferries also ordered nuclear stress test which was done 09/21/23 and show normal perfusion, low risk, EF 67%.  Preop labs reviewed, WNL.  EKG 09/21/23: Sinus rhythm with PACs. Rate 68.  FVC-%Pred-Pre % 74  FEV1-%Pred-Pre % 63  FEV1FVC-%Pred-Pre % 85  TLC % pred % 79  RV % pred % 87  DLCO unc % pred % 61   CT Super D Chest 07/25/23: IMPRESSION: 1. Enlarging complex cystic and solid appearing lesion in the upper aspect of the right middle lobe. This measures approximately 3.2 x 3.1 x 3.1 cm and appears more solid than the prior study. Findings are concerning for neoplasm. 2. Stable 11 mm left lower lobe pulmonary nodule. 3. Stable small scattered mediastinal and hilar lymph nodes. No new or progressive findings. 4. Stable significant underlying emphysematous lung disease and associated significant basilar pulmonary scarring. 5. Stable advanced three-vessel coronary artery calcifications.  Nuclear stress 09/21/23:   The study is normal. The study is low risk.   No ST deviation was noted.   LV perfusion is normal. There is no evidence of ischemia. There is no evidence of infarction.   Left ventricular function is normal. Nuclear  stress EF: 67%. The left ventricular ejection fraction is hyperdynamic (>65%). End diastolic cavity size is normal. End systolic cavity size is normal. No evidence of transient ischemic dilation (TID) noted.   Coronary calcium was present on the attenuation correction CT images. Severe coronary calcifications were present. Coronary calcifications were present in the left anterior descending artery, left circumflex artery and right coronary artery distribution(s).   Prior study not available for comparison.       Edilia Gordon [facility] Short Stay Center/Anesthesiology Phone [phone] 09/22/2023 7:40 AM

## 2023-09-22 NOTE — Anesthesia Preprocedure Evaluation (Signed)
 Anesthesia Evaluation  Patient identified by MRN, date of birth, ID band Patient awake    Reviewed: Allergy & Precautions, H&P , NPO status , Patient's Chart, lab work & pertinent test results  Airway Mallampati: II  TM Distance: >3 FB Neck ROM: Full    Dental no notable dental hx.    Pulmonary COPD, Current Smoker and Patient abstained from smoking., PE Pulmonary nodule of right middle lobe   Pulmonary exam normal breath sounds clear to auscultation       Cardiovascular hypertension, Normal cardiovascular exam Rhythm:Regular Rate:Normal  TTE:  - Procedure narrative: Transthoracic echocardiography. Image    quality was suboptimal. The study was technically difficult, as a    result of restricted patient mobility.  - Left ventricle: The cavity size was normal. Wall thickness was    increased in a pattern of moderate LVH. Systolic function was    vigorous. The estimated ejection fraction was in the range of 65%    to 70%. Incoordinate septal motion. The study is not technically    sufficient to allow evaluation of LV diastolic function.  - Mitral valve: Mildly thickened leaflets . There was trivial    regurgitation.  - Left atrium: The atrium was mildly dilated.  - Right ventricle: Appears dilated and hypokinetic, with a    hyperdynamic apex (McConnell&'s sign), suggestive of RV strain.  - Right atrium: The atrium was mildly dilated.  - Tricuspid valve: There was moderate regurgitation.  - Pulmonary arteries: PA peak pressure: 57 mm Hg (S).  - Inferior vena cava: The vessel was normal in size. The    respirophasic diameter changes were in the normal range (>= 50%),    consistent with normal central venous pressure.  - Pericardium, extracardiac: There was no pericardial effusion.   Impressions:   - Technically difficult study, LVEF 65-70%, moderate LVH,    incoordinate septal motion, mild biatrial enlargement, the RV is     not well-visualized, however, it appears hypokinetic and dilated    in the subcostal images with sparing of the apex suggestive of    right heart strain, there is moderate TR, RVSP 57 mmHg, normal    IVC.      3.7 cm infrarenal AAA on PET   Neuro/Psych neg Seizures negative neurological ROS  negative psych ROS   GI/Hepatic negative GI ROS,,,(+) Cirrhosis       , Hepatitis -, C  Endo/Other  negative endocrine ROS    Renal/GU negative Renal ROS  negative genitourinary   Musculoskeletal negative musculoskeletal ROS (+)    Abdominal   Peds negative pediatric ROS (+)  Hematology negative hematology ROS (+)   Anesthesia Other Findings   Reproductive/Obstetrics negative OB ROS                              Anesthesia Physical Anesthesia Plan  ASA: 3  Anesthesia Plan: General   Post-op Pain Management:    Induction: Intravenous  PONV Risk Score and Plan: 2 and Ondansetron , Dexamethasone  and Treatment may vary due to age or medical condition  Airway Management Planned: Double Lumen EBT  Additional Equipment: Arterial line  Intra-op Plan:   Post-operative Plan: Extubation in OR  Informed Consent: I have reviewed the patients History and Physical, chart, labs and discussed the procedure including the risks, benefits and alternatives for the proposed anesthesia with the patient or authorized representative who has indicated his/her understanding and acceptance.  Dental advisory given  Plan Discussed with: CRNA  Anesthesia Plan Comments: (PAT note by Rudy Costain, PA-C:  73 yo male with pertinent hx including DVT/PE 2017, HCV s/p treatment, current smoker with mild COPD, Cirrhosis, AAA (4.1 cm by PET scan 08/2023), bladder cancer.   PET scan performed in October 2023 showed known right middle lobe nodule was hypermetabolic and suspicious for bronchogenic carcinoma.  There was another 11 mm nodule in the left lower lobe that did not  have any hypermetabolic activity which was suggestive of benign granuloma.  It was monitored and stable on scans from March 2024 and September 2024 but the scan from 07/2023 showed stable to increased in size in the right middle lobe lesion. Recent bronchoscopy 08/08/23 showed adenocarcinoma.   Seen by Dr. Deloise Ferries 09/11/23. Robotic assisted right middle lobectomy recommended. Dr. Deloise Ferries also ordered nuclear stress test which was done 09/21/23 and show normal perfusion, low risk, EF 67%.  Preop labs reviewed, WNL.  EKG 09/21/23: Sinus rhythm with PACs. Rate 68.  FVC-%Pred-Pre % 74 FEV1-%Pred-Pre % 63 FEV1FVC-%Pred-Pre % 85 TLC % pred % 79 RV % pred % 87 DLCO unc % pred % 61  CT Super D Chest 07/25/23: IMPRESSION: 1. Enlarging complex cystic and solid appearing lesion in the upper aspect of the right middle lobe. This measures approximately 3.2 x 3.1 x 3.1 cm and appears more solid than the prior study. Findings are concerning for neoplasm. 2. Stable 11 mm left lower lobe pulmonary nodule. 3. Stable small scattered mediastinal and hilar lymph nodes. No new or progressive findings. 4. Stable significant underlying emphysematous lung disease and associated significant basilar pulmonary scarring. 5. Stable advanced three-vessel coronary artery calcifications.  Nuclear stress 09/21/23:   The study is normal. The study is low risk.   No ST deviation was noted.   LV perfusion is normal. There is no evidence of ischemia. There is no evidence of infarction.   Left ventricular function is normal. Nuclear stress EF: 67%. The left ventricular ejection fraction is hyperdynamic (>65%). End diastolic cavity size is normal. End systolic cavity size is normal. No evidence of transient ischemic dilation (TID) noted.   Coronary calcium was present on the attenuation correction CT images. Severe coronary calcifications were present. Coronary calcifications were present in the left anterior descending  artery, left circumflex artery and right coronary artery distribution(s).   Prior study not available for comparison.    )         Anesthesia Quick Evaluation

## 2023-09-25 ENCOUNTER — Inpatient Hospital Stay (HOSPITAL_COMMUNITY): Admitting: Physician Assistant

## 2023-09-25 ENCOUNTER — Inpatient Hospital Stay (HOSPITAL_COMMUNITY)
Admission: RE | Admit: 2023-09-25 | Discharge: 2023-10-05 | DRG: 164 | Disposition: A | Discharge status: Home / Self Care | Source: Ambulatory Visit | Attending: Thoracic Surgery (Cardiothoracic Vascular Surgery) | Admitting: Thoracic Surgery (Cardiothoracic Vascular Surgery)

## 2023-09-25 ENCOUNTER — Other Ambulatory Visit: Payer: Self-pay

## 2023-09-25 ENCOUNTER — Inpatient Hospital Stay (HOSPITAL_COMMUNITY): Admitting: Certified Registered Nurse Anesthetist

## 2023-09-25 ENCOUNTER — Encounter (HOSPITAL_COMMUNITY): Payer: Self-pay | Admitting: Thoracic Surgery (Cardiothoracic Vascular Surgery)

## 2023-09-25 ENCOUNTER — Encounter (HOSPITAL_COMMUNITY)
Admission: RE | Disposition: A | Discharge status: Home / Self Care | Payer: Self-pay | Source: Ambulatory Visit | Attending: Thoracic Surgery (Cardiothoracic Vascular Surgery)

## 2023-09-25 ENCOUNTER — Inpatient Hospital Stay (HOSPITAL_COMMUNITY)

## 2023-09-25 DIAGNOSIS — F17210 Nicotine dependence, cigarettes, uncomplicated: Secondary | ICD-10-CM | POA: Diagnosis not present

## 2023-09-25 DIAGNOSIS — R7303 Prediabetes: Secondary | ICD-10-CM | POA: Diagnosis not present

## 2023-09-25 DIAGNOSIS — E785 Hyperlipidemia, unspecified: Secondary | ICD-10-CM | POA: Diagnosis not present

## 2023-09-25 DIAGNOSIS — C342 Malignant neoplasm of middle lobe, bronchus or lung: Secondary | ICD-10-CM | POA: Diagnosis not present

## 2023-09-25 DIAGNOSIS — I10 Essential (primary) hypertension: Secondary | ICD-10-CM | POA: Diagnosis not present

## 2023-09-25 DIAGNOSIS — R911 Solitary pulmonary nodule: Secondary | ICD-10-CM

## 2023-09-25 DIAGNOSIS — Z8249 Family history of ischemic heart disease and other diseases of the circulatory system: Secondary | ICD-10-CM

## 2023-09-25 DIAGNOSIS — Z86718 Personal history of other venous thrombosis and embolism: Secondary | ICD-10-CM | POA: Diagnosis not present

## 2023-09-25 DIAGNOSIS — Z4682 Encounter for fitting and adjustment of non-vascular catheter: Secondary | ICD-10-CM | POA: Diagnosis not present

## 2023-09-25 DIAGNOSIS — Z48813 Encounter for surgical aftercare following surgery on the respiratory system: Secondary | ICD-10-CM | POA: Diagnosis not present

## 2023-09-25 DIAGNOSIS — Z604 Social exclusion and rejection: Secondary | ICD-10-CM | POA: Diagnosis present

## 2023-09-25 DIAGNOSIS — Z86711 Personal history of pulmonary embolism: Secondary | ICD-10-CM | POA: Diagnosis not present

## 2023-09-25 DIAGNOSIS — Z79899 Other long term (current) drug therapy: Secondary | ICD-10-CM | POA: Diagnosis not present

## 2023-09-25 DIAGNOSIS — Z8551 Personal history of malignant neoplasm of bladder: Secondary | ICD-10-CM | POA: Diagnosis not present

## 2023-09-25 DIAGNOSIS — J939 Pneumothorax, unspecified: Secondary | ICD-10-CM | POA: Diagnosis not present

## 2023-09-25 DIAGNOSIS — R490 Dysphonia: Secondary | ICD-10-CM | POA: Diagnosis not present

## 2023-09-25 DIAGNOSIS — J449 Chronic obstructive pulmonary disease, unspecified: Secondary | ICD-10-CM | POA: Diagnosis present

## 2023-09-25 DIAGNOSIS — J984 Other disorders of lung: Secondary | ICD-10-CM | POA: Diagnosis not present

## 2023-09-25 DIAGNOSIS — Z902 Acquired absence of lung [part of]: Secondary | ICD-10-CM

## 2023-09-25 DIAGNOSIS — Z7982 Long term (current) use of aspirin: Secondary | ICD-10-CM

## 2023-09-25 DIAGNOSIS — R918 Other nonspecific abnormal finding of lung field: Secondary | ICD-10-CM | POA: Diagnosis not present

## 2023-09-25 DIAGNOSIS — J982 Interstitial emphysema: Secondary | ICD-10-CM | POA: Diagnosis present

## 2023-09-25 DIAGNOSIS — J439 Emphysema, unspecified: Secondary | ICD-10-CM | POA: Diagnosis not present

## 2023-09-25 DIAGNOSIS — J9382 Other air leak: Secondary | ICD-10-CM | POA: Diagnosis not present

## 2023-09-25 DIAGNOSIS — J9381 Chronic pneumothorax: Secondary | ICD-10-CM | POA: Diagnosis not present

## 2023-09-25 DIAGNOSIS — K7460 Unspecified cirrhosis of liver: Secondary | ICD-10-CM | POA: Diagnosis present

## 2023-09-25 DIAGNOSIS — Z833 Family history of diabetes mellitus: Secondary | ICD-10-CM | POA: Diagnosis not present

## 2023-09-25 DIAGNOSIS — N400 Enlarged prostate without lower urinary tract symptoms: Secondary | ICD-10-CM | POA: Diagnosis present

## 2023-09-25 DIAGNOSIS — T797XXA Traumatic subcutaneous emphysema, initial encounter: Secondary | ICD-10-CM | POA: Diagnosis not present

## 2023-09-25 DIAGNOSIS — I700 Atherosclerosis of aorta: Secondary | ICD-10-CM | POA: Diagnosis not present

## 2023-09-25 DIAGNOSIS — J9811 Atelectasis: Secondary | ICD-10-CM | POA: Diagnosis not present

## 2023-09-25 HISTORY — PX: LOBECTOMY, LUNG, ROBOT-ASSISTED, USING VATS: SHX7607

## 2023-09-25 HISTORY — PX: INTERCOSTAL NERVE BLOCK: SHX5021

## 2023-09-25 LAB — PREPARE RBC (CROSSMATCH)

## 2023-09-25 LAB — ABO/RH: ABO/RH(D): O POS

## 2023-09-25 SURGERY — LOBECTOMY, LUNG, ROBOT-ASSISTED, USING VATS
Anesthesia: General | Site: Chest | Laterality: Right

## 2023-09-25 MED ORDER — LIDOCAINE 2% (20 MG/ML) 5 ML SYRINGE
INTRAMUSCULAR | Status: AC
  Filled 2023-09-25: qty 5

## 2023-09-25 MED ORDER — DROPERIDOL 2.5 MG/ML IJ SOLN: 0.6250 mg | Freq: Once | INTRAMUSCULAR | Status: DC | PRN

## 2023-09-25 MED ORDER — OXYCODONE HCL 5 MG PO TABS
5.0000 mg | ORAL_TABLET | ORAL | Status: DC | PRN
  Administered 2023-10-02 – 2023-10-04 (×3): 5 mg via ORAL
  Filled 2023-09-25 (×3): qty 1

## 2023-09-25 MED ORDER — PHENYLEPHRINE 80 MCG/ML (10ML) SYRINGE FOR IV PUSH (FOR BLOOD PRESSURE SUPPORT)
PREFILLED_SYRINGE | INTRAVENOUS | Status: DC | PRN
  Administered 2023-09-25: 40 ug via INTRAVENOUS
  Administered 2023-09-25: 80 ug via INTRAVENOUS
  Administered 2023-09-25: 120 ug via INTRAVENOUS
  Administered 2023-09-25: 80 ug via INTRAVENOUS

## 2023-09-25 MED ORDER — SODIUM CHLORIDE (PF) 0.9 % IJ SOLN
INTRAMUSCULAR | Status: AC
  Filled 2023-09-25: qty 50

## 2023-09-25 MED ORDER — ACETAMINOPHEN 500 MG PO TABS
1000.0000 mg | ORAL_TABLET | Freq: Four times a day (QID) | ORAL | Status: AC
  Administered 2023-09-25 – 2023-09-30 (×16): 1000 mg via ORAL
  Filled 2023-09-25 (×17): qty 2

## 2023-09-25 MED ORDER — OXYCODONE HCL 5 MG PO TABS: 5.0000 mg | ORAL_TABLET | Freq: Once | ORAL | Status: DC | PRN

## 2023-09-25 MED ORDER — GABAPENTIN 300 MG PO CAPS
300.0000 mg | ORAL_CAPSULE | Freq: Two times a day (BID) | ORAL | Status: DC
  Administered 2023-09-28 – 2023-10-05 (×14): 300 mg via ORAL
  Filled 2023-09-25 (×14): qty 1

## 2023-09-25 MED ORDER — ALBUTEROL SULFATE (2.5 MG/3ML) 0.083% IN NEBU: 2.5000 mg | INHALATION_SOLUTION | RESPIRATORY_TRACT | Status: DC

## 2023-09-25 MED ORDER — DEXAMETHASONE SODIUM PHOSPHATE 10 MG/ML IJ SOLN
INTRAMUSCULAR | Status: DC | PRN
  Administered 2023-09-25: 10 mg via INTRAVENOUS

## 2023-09-25 MED ORDER — CHLORHEXIDINE GLUCONATE 0.12 % MT SOLN
OROMUCOSAL | Status: AC
  Administered 2023-09-25: 15 mL via OROMUCOSAL
  Filled 2023-09-25: qty 15

## 2023-09-25 MED ORDER — MORPHINE SULFATE (PF) 2 MG/ML IV SOLN: 2.0000 mg | INTRAVENOUS | Status: DC | PRN

## 2023-09-25 MED ORDER — METOPROLOL SUCCINATE ER 25 MG PO TB24
25.0000 mg | ORAL_TABLET | Freq: Every day | ORAL | Status: DC
  Administered 2023-09-26 – 2023-10-05 (×10): 25 mg via ORAL
  Filled 2023-09-25 (×10): qty 1

## 2023-09-25 MED ORDER — CEFAZOLIN SODIUM-DEXTROSE 2-4 GM/100ML-% IV SOLN
2.0000 g | Freq: Three times a day (TID) | INTRAVENOUS | Status: AC
  Administered 2023-09-25 – 2023-09-26 (×2): 2 g via INTRAVENOUS
  Filled 2023-09-25 (×2): qty 100

## 2023-09-25 MED ORDER — PANTOPRAZOLE SODIUM 40 MG PO TBEC
40.0000 mg | DELAYED_RELEASE_TABLET | Freq: Every day | ORAL | Status: DC
  Administered 2023-09-26 – 2023-10-05 (×10): 40 mg via ORAL
  Filled 2023-09-25 (×10): qty 1

## 2023-09-25 MED ORDER — PHENYLEPHRINE HCL-NACL 20-0.9 MG/250ML-% IV SOLN
INTRAVENOUS | Status: DC | PRN
  Administered 2023-09-25: 20 ug/min via INTRAVENOUS

## 2023-09-25 MED ORDER — BUPIVACAINE LIPOSOME 1.3 % IJ SUSP
INTRAMUSCULAR | Status: AC
  Filled 2023-09-25: qty 20

## 2023-09-25 MED ORDER — ONDANSETRON HCL 4 MG/2ML IJ SOLN
INTRAMUSCULAR | Status: DC | PRN
  Administered 2023-09-25: 4 mg via INTRAVENOUS

## 2023-09-25 MED ORDER — SENNOSIDES-DOCUSATE SODIUM 8.6-50 MG PO TABS
1.0000 | ORAL_TABLET | Freq: Every day | ORAL | Status: DC
  Administered 2023-09-26 – 2023-10-04 (×5): 1 via ORAL
  Filled 2023-09-25 (×7): qty 1

## 2023-09-25 MED ORDER — CEFAZOLIN SODIUM-DEXTROSE 2-4 GM/100ML-% IV SOLN
2.0000 g | INTRAVENOUS | Status: AC
  Administered 2023-09-25: 2 g via INTRAVENOUS
  Filled 2023-09-25: qty 100

## 2023-09-25 MED ORDER — ALBUTEROL SULFATE (2.5 MG/3ML) 0.083% IN NEBU
2.5000 mg | INHALATION_SOLUTION | RESPIRATORY_TRACT | Status: DC
Start: 2023-09-25 — End: 2023-09-26
  Administered 2023-09-26: 2.5 mg via RESPIRATORY_TRACT
  Filled 2023-09-25: qty 3

## 2023-09-25 MED ORDER — ALBUMIN HUMAN 5 % IV SOLN: INTRAVENOUS | Status: DC | PRN

## 2023-09-25 MED ORDER — FENTANYL CITRATE (PF) 250 MCG/5ML IJ SOLN
INTRAMUSCULAR | Status: AC
  Filled 2023-09-25: qty 5

## 2023-09-25 MED ORDER — OXYCODONE HCL 5 MG/5ML PO SOLN: 5.0000 mg | Freq: Once | ORAL | Status: DC | PRN

## 2023-09-25 MED ORDER — GABAPENTIN 300 MG PO CAPS
300.0000 mg | ORAL_CAPSULE | Freq: Every day | ORAL | Status: AC
Start: 2023-09-25 — End: 2023-09-28
  Administered 2023-09-25 – 2023-09-27 (×3): 300 mg via ORAL
  Filled 2023-09-25 (×3): qty 1

## 2023-09-25 MED ORDER — LACTATED RINGERS IV SOLN: INTRAVENOUS | Status: DC

## 2023-09-25 MED ORDER — MIDAZOLAM HCL 2 MG/2ML IJ SOLN
INTRAMUSCULAR | Status: DC | PRN
  Administered 2023-09-25: 1 mg via INTRAVENOUS

## 2023-09-25 MED ORDER — MIDAZOLAM HCL 2 MG/2ML IJ SOLN
INTRAMUSCULAR | Status: AC
  Filled 2023-09-25: qty 2

## 2023-09-25 MED ORDER — PHENYLEPHRINE 80 MCG/ML (10ML) SYRINGE FOR IV PUSH (FOR BLOOD PRESSURE SUPPORT)
PREFILLED_SYRINGE | INTRAVENOUS | Status: AC
  Filled 2023-09-25: qty 20

## 2023-09-25 MED ORDER — 0.9 % SODIUM CHLORIDE (POUR BTL) OPTIME
TOPICAL | Status: DC | PRN
  Administered 2023-09-25: 1000 mL

## 2023-09-25 MED ORDER — LUNG SURGERY BOOK
Freq: Once | Status: AC
  Filled 2023-09-25: qty 1

## 2023-09-25 MED ORDER — LOSARTAN POTASSIUM 50 MG PO TABS: 100.0000 mg | ORAL_TABLET | Freq: Every day | ORAL | Status: DC

## 2023-09-25 MED ORDER — LACTATED RINGERS IV SOLN: INTRAVENOUS | Status: DC | PRN

## 2023-09-25 MED ORDER — ACETAMINOPHEN 160 MG/5ML PO SOLN: 1000.0000 mg | Freq: Four times a day (QID) | ORAL | Status: AC

## 2023-09-25 MED ORDER — ENOXAPARIN SODIUM 40 MG/0.4ML IJ SOSY
40.0000 mg | PREFILLED_SYRINGE | Freq: Every day | INTRAMUSCULAR | Status: DC
  Administered 2023-09-26 – 2023-10-05 (×9): 40 mg via SUBCUTANEOUS
  Filled 2023-09-25 (×10): qty 0.4

## 2023-09-25 MED ORDER — SUGAMMADEX SODIUM 200 MG/2ML IV SOLN
INTRAVENOUS | Status: DC | PRN
  Administered 2023-09-25: 200 mg via INTRAVENOUS

## 2023-09-25 MED ORDER — BUPIVACAINE HCL (PF) 0.5 % IJ SOLN
INTRAMUSCULAR | Status: AC
  Filled 2023-09-25: qty 30

## 2023-09-25 MED ORDER — HEMOSTATIC AGENTS (NO CHARGE) OPTIME
TOPICAL | Status: DC | PRN
Start: 2023-09-25 — End: 2023-09-25
  Administered 2023-09-25: 1 via TOPICAL

## 2023-09-25 MED ORDER — ATORVASTATIN CALCIUM 10 MG PO TABS
10.0000 mg | ORAL_TABLET | Freq: Every day | ORAL | Status: DC
  Administered 2023-09-26 – 2023-10-05 (×10): 10 mg via ORAL
  Filled 2023-09-25 (×10): qty 1

## 2023-09-25 MED ORDER — CHLORHEXIDINE GLUCONATE 0.12 % MT SOLN: 15.0000 mL | OROMUCOSAL | Status: AC

## 2023-09-25 MED ORDER — ONDANSETRON HCL 4 MG/2ML IJ SOLN
INTRAMUSCULAR | Status: AC
Start: 2023-09-25 — End: ?
  Filled 2023-09-25: qty 2

## 2023-09-25 MED ORDER — EPHEDRINE 5 MG/ML INJ
INTRAVENOUS | Status: AC
  Filled 2023-09-25: qty 10

## 2023-09-25 MED ORDER — KETAMINE HCL 50 MG/5ML IJ SOSY
PREFILLED_SYRINGE | INTRAMUSCULAR | Status: AC
  Filled 2023-09-25: qty 5

## 2023-09-25 MED ORDER — PROPOFOL 10 MG/ML IV BOLUS
INTRAVENOUS | Status: AC
  Filled 2023-09-25: qty 20

## 2023-09-25 MED ORDER — AMLODIPINE BESYLATE 5 MG PO TABS: 5.0000 mg | ORAL_TABLET | Freq: Every day | ORAL | Status: DC

## 2023-09-25 MED ORDER — VARENICLINE TARTRATE 1 MG PO TABS
1.0000 mg | ORAL_TABLET | Freq: Two times a day (BID) | ORAL | Status: DC
  Administered 2023-10-04: 1 mg via ORAL
  Filled 2023-09-25 (×21): qty 1

## 2023-09-25 MED ORDER — ROCURONIUM BROMIDE 10 MG/ML (PF) SYRINGE
PREFILLED_SYRINGE | INTRAVENOUS | Status: DC | PRN
  Administered 2023-09-25 (×2): 20 mg via INTRAVENOUS
  Administered 2023-09-25: 100 mg via INTRAVENOUS

## 2023-09-25 MED ORDER — ASPIRIN 81 MG PO TBEC
81.0000 mg | DELAYED_RELEASE_TABLET | Freq: Every day | ORAL | Status: DC
  Administered 2023-09-26 – 2023-10-05 (×9): 81 mg via ORAL
  Filled 2023-09-25 (×10): qty 1

## 2023-09-25 MED ORDER — FENTANYL CITRATE (PF) 250 MCG/5ML IJ SOLN
INTRAMUSCULAR | Status: DC | PRN
  Administered 2023-09-25: 100 ug via INTRAVENOUS
  Administered 2023-09-25 (×2): 50 ug via INTRAVENOUS

## 2023-09-25 MED ORDER — AMLODIPINE BESYLATE 5 MG PO TABS
5.0000 mg | ORAL_TABLET | Freq: Every day | ORAL | Status: DC
  Administered 2023-09-25 – 2023-10-04 (×10): 5 mg via ORAL
  Filled 2023-09-25 (×10): qty 1

## 2023-09-25 MED ORDER — LOSARTAN POTASSIUM-HCTZ 100-12.5 MG PO TABS: 1.0000 | ORAL_TABLET | Freq: Every day | ORAL | Status: DC

## 2023-09-25 MED ORDER — SODIUM CHLORIDE FLUSH 0.9 % IV SOLN
INTRAVENOUS | Status: DC | PRN
  Administered 2023-09-25: 100 mL

## 2023-09-25 MED ORDER — ONDANSETRON HCL 4 MG/2ML IJ SOLN: 4.0000 mg | Freq: Four times a day (QID) | INTRAMUSCULAR | Status: DC | PRN

## 2023-09-25 MED ORDER — TAMSULOSIN HCL 0.4 MG PO CAPS
0.4000 mg | ORAL_CAPSULE | Freq: Every day | ORAL | Status: DC
  Administered 2023-09-25 – 2023-10-04 (×10): 0.4 mg via ORAL
  Filled 2023-09-25 (×10): qty 1

## 2023-09-25 MED ORDER — PROPOFOL 10 MG/ML IV BOLUS
INTRAVENOUS | Status: DC | PRN
  Administered 2023-09-25: 170 mg via INTRAVENOUS
  Administered 2023-09-25: 30 mg via INTRAVENOUS

## 2023-09-25 MED ORDER — ROCURONIUM BROMIDE 10 MG/ML (PF) SYRINGE
PREFILLED_SYRINGE | INTRAVENOUS | Status: AC
  Filled 2023-09-25: qty 10

## 2023-09-25 MED ORDER — ACETAMINOPHEN 10 MG/ML IV SOLN: 1000.0000 mg | Freq: Once | INTRAVENOUS | Status: DC | PRN

## 2023-09-25 MED ORDER — KETAMINE HCL 50 MG/5ML IJ SOSY
PREFILLED_SYRINGE | INTRAMUSCULAR | Status: DC | PRN
  Administered 2023-09-25: 20 mg via INTRAVENOUS
  Administered 2023-09-25: 10 mg via INTRAVENOUS

## 2023-09-25 MED ORDER — SUCCINYLCHOLINE CHLORIDE 200 MG/10ML IV SOSY
PREFILLED_SYRINGE | INTRAVENOUS | Status: AC
  Filled 2023-09-25: qty 10

## 2023-09-25 MED ORDER — FENTANYL CITRATE (PF) 100 MCG/2ML IJ SOLN: 25.0000 ug | INTRAMUSCULAR | Status: DC | PRN

## 2023-09-25 MED ORDER — DEXAMETHASONE SODIUM PHOSPHATE 10 MG/ML IJ SOLN
INTRAMUSCULAR | Status: AC
  Filled 2023-09-25: qty 1

## 2023-09-25 MED ORDER — LIDOCAINE 2% (20 MG/ML) 5 ML SYRINGE
INTRAMUSCULAR | Status: DC | PRN
  Administered 2023-09-25: 100 mg via INTRAVENOUS

## 2023-09-25 MED ORDER — BISACODYL 5 MG PO TBEC
10.0000 mg | DELAYED_RELEASE_TABLET | Freq: Every day | ORAL | Status: DC
  Administered 2023-09-25 – 2023-10-04 (×5): 10 mg via ORAL
  Filled 2023-09-25 (×10): qty 2

## 2023-09-25 MED ORDER — SODIUM CHLORIDE 0.9% IV SOLUTION: Freq: Once | INTRAVENOUS | Status: DC

## 2023-09-25 MED ORDER — HYDROCHLOROTHIAZIDE 12.5 MG PO TABS
12.5000 mg | ORAL_TABLET | Freq: Every day | ORAL | Status: DC
  Filled 2023-09-25: qty 1

## 2023-09-25 SURGICAL SUPPLY — 62 items
BLADE SURG 11 STRL SS (BLADE) ×1 IMPLANT
CANISTER SUCT 3000ML PPV (MISCELLANEOUS) ×2 IMPLANT
CANNULA REDUCER 12-8 DVNC XI (CANNULA) ×2 IMPLANT
CATH ROBINSON RED A/P 14FR (CATHETERS) IMPLANT
CATH THORACIC 28FR (CATHETERS) ×1 IMPLANT
CHLORAPREP W/TINT 26 (MISCELLANEOUS) ×1 IMPLANT
CLIP TI MEDIUM 6 (CLIP) IMPLANT
CNTNR URN SCR LID CUP LEK RST (MISCELLANEOUS) ×5 IMPLANT
CONN ST 1/4X3/8 BEN (MISCELLANEOUS) IMPLANT
DEFOGGER SCOPE WARM SEASHARP (MISCELLANEOUS) ×1 IMPLANT
DERMABOND ADVANCED .7 DNX12 (GAUZE/BANDAGES/DRESSINGS) ×1 IMPLANT
DRAIN CHANNEL 28F RND 3/8 FF (WOUND CARE) IMPLANT
DRAPE ARM DVNC X/XI (DISPOSABLE) ×4 IMPLANT
DRAPE COLUMN DVNC XI (DISPOSABLE) ×1 IMPLANT
DRAPE CV SPLIT W-CLR ANES SCRN (DRAPES) ×1 IMPLANT
DRAPE SURG ORHT 6 SPLT 77X108 (DRAPES) ×1 IMPLANT
ELECTRODE REM PT RTRN 9FT ADLT (ELECTROSURGICAL) ×1 IMPLANT
FORCEPS BPLR LNG DVNC XI (INSTRUMENTS) IMPLANT
FORCEPS CADIERE DVNC XI (FORCEP) IMPLANT
GAUZE KITTNER 4X8 (MISCELLANEOUS) ×1 IMPLANT
GAUZE SPONGE 4X4 12PLY STRL (GAUZE/BANDAGES/DRESSINGS) ×1 IMPLANT
GLOVE BIO SURGEON STRL SZ7.5 (GLOVE) ×2 IMPLANT
GLOVE SURG POLYISO LF SZ8 (GLOVE) ×1 IMPLANT
GOWN STRL REUS W/ TWL LRG LVL3 (GOWN DISPOSABLE) ×2 IMPLANT
GOWN STRL REUS W/ TWL XL LVL3 (GOWN DISPOSABLE) ×2 IMPLANT
GOWN STRL REUS W/TWL 2XL LVL3 (GOWN DISPOSABLE) ×1 IMPLANT
GRASPER TIP-UP FEN DVNC XI (INSTRUMENTS) IMPLANT
HEMOSTAT SURGICEL 2X14 (HEMOSTASIS) ×1 IMPLANT
IRRIGATION STRYKERFLOW (MISCELLANEOUS) IMPLANT
KIT BASIN OR (CUSTOM PROCEDURE TRAY) ×1 IMPLANT
KIT TURNOVER KIT B (KITS) ×1 IMPLANT
NDL 22X1.5 STRL (OR ONLY) (MISCELLANEOUS) ×1 IMPLANT
NEEDLE 22X1.5 STRL (OR ONLY) (MISCELLANEOUS) ×1 IMPLANT
NS IRRIG 1000ML POUR BTL (IV SOLUTION) ×3 IMPLANT
PACK CHEST (CUSTOM PROCEDURE TRAY) ×1 IMPLANT
PAD ARMBOARD POSITIONER FOAM (MISCELLANEOUS) ×5 IMPLANT
RELOAD STAPLE 45 2.0 GRY DVNC (STAPLE) IMPLANT
RELOAD STAPLE 45 2.5 WHT DVNC (STAPLE) IMPLANT
RELOAD STAPLE 45 3.5 BLU DVNC (STAPLE) IMPLANT
RELOAD STAPLER 2.5X45 WHT DVNC (STAPLE) ×3 IMPLANT
RELOAD STAPLER 3.5X45 BLU DVNC (STAPLE) ×6 IMPLANT
SEAL UNIV 5-12 XI (MISCELLANEOUS) ×4 IMPLANT
SET TRI-LUMEN FLTR TB AIRSEAL (TUBING) ×1 IMPLANT
SOLUTION ELECTROSURG ANTI STCK (MISCELLANEOUS) IMPLANT
STAPLE RELOAD 45 2.0 GRAY DVNC (STAPLE) ×1 IMPLANT
STAPLER 45 SUREFORM CVD DVNC (STAPLE) IMPLANT
STOPCOCK 4 WAY LG BORE MALE ST (IV SETS) ×1 IMPLANT
SUT SILK 1 MH (SUTURE) ×1 IMPLANT
SUT SILK 2 0 SH (SUTURE) IMPLANT
SUT VIC AB 2-0 CT1 TAPERPNT 27 (SUTURE) ×1 IMPLANT
SUT VIC AB 3-0 SH 27X BRD (SUTURE) ×2 IMPLANT
SUT VICRYL 0 TIES 12 18 (SUTURE) ×1 IMPLANT
SUT VICRYL 0 UR6 27IN ABS (SUTURE) ×2 IMPLANT
SYR 10ML LL (SYRINGE) ×1 IMPLANT
SYR 20ML LL LF (SYRINGE) ×1 IMPLANT
SYR 50ML LL SCALE MARK (SYRINGE) ×1 IMPLANT
SYSTEM RETRIEVAL ANCHOR 12 (MISCELLANEOUS) IMPLANT
SYSTEM SAHARA CHEST DRAIN ATS (WOUND CARE) ×1 IMPLANT
TAPE CLOTH 4X10 WHT NS (GAUZE/BANDAGES/DRESSINGS) ×1 IMPLANT
TOWEL GREEN STERILE (TOWEL DISPOSABLE) ×1 IMPLANT
TRAY FOLEY MTR SLVR 16FR STAT (SET/KITS/TRAYS/PACK) ×1 IMPLANT
TUBING EXTENTION W/L.L. (IV SETS) ×1 IMPLANT

## 2023-09-25 NOTE — Plan of Care (Signed)
  Problem: Education: Goal: Knowledge of General Education information will improve Description: Including pain rating scale, medication(s)/side effects and non-pharmacologic comfort measures Outcome: Progressing   Problem: Clinical Measurements: Goal: Will remain free from infection Outcome: Progressing   Problem: Nutrition: Goal: Adequate nutrition will be maintained Outcome: Progressing   

## 2023-09-25 NOTE — Anesthesia Procedure Notes (Signed)
 Arterial Line Insertion Start/End5/09/2023 10:00 AM, 09/25/2023 10:14 AM Performed by: Alphia Jasmine, CRNA, CRNA  Patient location: Pre-op. Preanesthetic checklist: patient identified, IV checked, site marked, risks and benefits discussed, surgical consent, monitors and equipment checked, pre-op evaluation, timeout performed and anesthesia consent Lidocaine  1% used for infiltration Left, radial was placed Catheter size: 20 G Hand hygiene performed  and maximum sterile barriers used  Allen's test indicative of satisfactory collateral circulation Attempts: 1 Procedure performed without using ultrasound guided technique. Following insertion, dressing applied and Biopatch. Post procedure assessment: normal  Patient tolerated the procedure well with no immediate complications.

## 2023-09-25 NOTE — Anesthesia Postprocedure Evaluation (Signed)
 Anesthesia Post Note  Patient: HAMP KALKOWSKI  Procedure(s) Performed: LOBECTOMY, LUNG, ROBOT-ASSISTED, USING VATS (Right: Chest) BLOCK, NERVE, INTERCOSTAL (Right: Chest)     Patient location during evaluation: PACU Anesthesia Type: General Level of consciousness: awake and alert Pain management: pain level controlled Vital Signs Assessment: post-procedure vital signs reviewed and stable Respiratory status: spontaneous breathing, nonlabored ventilation, respiratory function stable and patient connected to nasal cannula oxygen Cardiovascular status: blood pressure returned to baseline and stable Postop Assessment: no apparent nausea or vomiting Anesthetic complications: no   No notable events documented.  Last Vitals:  Vitals:   09/25/23 1545 09/25/23 1600  BP: (!) 150/86 (!) 147/76  Pulse: 74 75  Resp: 17 18  Temp:    SpO2: 92% 94%    Last Pain:  Vitals:   09/25/23 1600  TempSrc:   PainSc: 0-No pain                 Lethaniel Rave

## 2023-09-25 NOTE — Anesthesia Procedure Notes (Signed)
 Procedure Name: Intubation Date/Time: 09/25/2023 11:31 AM  Performed by: Bubba Carbo, CRNAPre-anesthesia Checklist: Patient identified, Emergency Drugs available, Suction available and Patient being monitored Patient Re-evaluated:Patient Re-evaluated prior to induction Oxygen Delivery Method: Circle system utilized Preoxygenation: Pre-oxygenation with 100% oxygen Induction Type: IV induction Ventilation: Mask ventilation without difficulty Laryngoscope Size: Glidescope and 4 Grade View: Grade II Tube type: Oral Endobronchial tube: Left, EBT position confirmed by auscultation and EBT position confirmed by fiberoptic bronchoscope and 39 Fr Number of attempts: 1 Airway Equipment and Method: Stylet and Oral airway Placement Confirmation: ETT inserted through vocal cords under direct vision, positive ETCO2 and breath sounds checked- equal and bilateral Secured at: 30 cm Tube secured with: Tape Dental Injury: Teeth and Oropharynx as per pre-operative assessment  Comments: Elective glidescope. Hx of glidescope intubation.  Intubation by SRNA. Treen confirmed placement with bronchoscope.

## 2023-09-25 NOTE — Transfer of Care (Signed)
 Immediate Anesthesia Transfer of Care Note  Patient: Wyatt Berry  Procedure(s) Performed: LOBECTOMY, LUNG, ROBOT-ASSISTED, USING VATS (Right: Chest) BLOCK, NERVE, INTERCOSTAL (Right: Chest)  Patient Location: PACU  Anesthesia Type:General  Level of Consciousness: awake and drowsy  Airway & Oxygen Therapy: Patient Spontanous Breathing and Patient connected to face mask oxygen  Post-op Assessment: Report given to RN, Post -op Vital signs reviewed and stable, and Patient moving all extremities X 4  Post vital signs: Reviewed and stable  Last Vitals:  Vitals Value Taken Time  BP    Temp    Pulse 70 09/25/23 1536  Resp 19 09/25/23 1536  SpO2 95 % 09/25/23 1536  Vitals shown include unfiled device data.  Last Pain:  Vitals:   09/25/23 0918  TempSrc:   PainSc: 0-No pain      Patients Stated Pain Goal: 0 (09/25/23 1610)  Complications: No notable events documented.

## 2023-09-25 NOTE — Op Note (Signed)
      [address]       [place] [address]             [phone]        09/25/2023  Patient:  Wyatt Berry Pre-Op Dx: Right middle lobe NSCLC   Post-op Dx:  same Procedure: - Robotic assisted right video thoracoscopy - lysis of adhesion, complicating the case by 25% - right lobectomy - Mediastinal lymph node sampling - Intercostal nerve block  Surgeon and Role:      * Brighton Pilley, Marinell Siad, MD - Primary  Assistant: Wilburt Hands, PA-C  An experienced assistant was required given the complexity of this surgery and the standard of surgical care. The assistant was needed for exposure, dissection, suctioning, retraction of delicate tissues and sutures, instrument exchange and for overall help during this procedure.    Anesthesia  general EBL:  150 ml Blood Administration: none Specimen:  right middle lobe and mediastinal nodes  Drains: 28 F argyle chest tube in right chest Counts: correct   Indications: 74 yo male with 3.2cm RML adenocarcinoma.  PET/CT does not show any distant disease.  His pulmonary functions are adequate.  We discussed the risks and benefits of a right robotic assisted thoracoscopy with right middle lobectomy.  He will completely stop smoking today.  He is agreeable to proceed.      Findings: Dense adhesion, and firm lymph nodes in the hilum  Operative Technique: After the risks, benefits and alternatives were thoroughly discussed, the patient was brought to the operative theatre.  Anesthesia was induced, and the patient was then placed in a lateral decubitus position and was prepped and draped in normal sterile fashion.  An appropriate surgical pause was performed, and pre-operative antibiotics were dosed accordingly.  We began by placing our 4 robotic ports in the the 7th intercostal space targeting the hilum of the lung.  A 12mm assistant port was placed in the 9th intercostal space in the anterior axillary line.  The robot was then  docked and all instruments were passed under direct visualization.    The lung was then retracted superiorly, and the inferior pulmonary ligament was divided.  The hilum was mobilized anteriorly and posteriorly.  We identified the middle lobe pulmonary vein, and after careful isolation, it was divided with a vascular stapler.  We next moved to the pulmonary artery.  The artery was then divided with a vascular load stapler.  The bronchus to the middle lobe was then isolated.  After a test clamp, with good ventilation of the remaining lung, the bronchus was then divided.  The fissure was completed, and the specimen was passed into an endocatch bag.  It was removed from the anterior access site.    Lymph nodes were then sampled at hilum and mediastinum.  The chest was irrigated, and an air leak test was performed.  An intercostal nerve block was performed under direct visualization.  A 28 F chest tube was then placed, and we watch the remaining lobes re-expand.  The skin and soft tissue were closed with absorbable suture    The patient tolerated the procedure without any immediate complications, and was transferred to the PACU in stable condition.  [name]

## 2023-09-25 NOTE — Hospital Course (Addendum)
 History of Present Illness:  Wyatt Berry is a 73 yo male with known history of Nicotine Abuse, Hepatitis C, Hyponatremia, H/O of pulmonary embolism, H/O DVT, and lung nodule.  He underwent bronchoscopy with biopsy which proved the lesion to be non-small cell carcinoma.  Further workup showed no evidence of metastasis on PET CT scan..  Brain MRI is pending.  He was referred to Dr. Deloise Ferries for surgical evaluation.  It was recommended the patient undergo Robotic Assisted Right Middle Lobectomy. The risks and benefits of the procedure were explained to the patient and he was agreeable to proceed.  Hospital Course:  Calen Anglade presented to [facility] on 09/25/2023.  He was taken to the operating room and underwent Robotic Assisted Right Video Thoracoscopy with right middle lobectomy and intercostal nerve block.  He tolerated the procedure without difficulty, was extubated and taken to the PACU in stable condition and was later transferred to [facility].  Vital signs and respiratory status remained stable.  He had minimal chest tube drainage that tapered off as expected however first 48 hours.  He had a persistent air leak that was also expected given the degree of adhesions encountered at the time of surgery.  Pain control is adequate with oral analgesics.  Mr. Ros was mobilized.  Diet was advanced and well-tolerated.  On 09/29/2023, the airleak had subsided.  We clamped the chest tube for about 3 and half hours and obtained a follow-up chest x-ray showing no evidence of pneumothorax.  When the clamp was removed, there was no airleak.  The chest tube was removed and an occlusive dressing was applied.  Mr. Winkle felt well with no shortness of breath but over the course of the next 24 hours he was noted to have increase in subcutaneous air in his right chest soft tissue standing up into the neck.  By this time, he had developed a small pneumothorax.  Since he was relatively asymptomatic, we elected  to observe for another 24 hours.  By postop day 6, he was having some dysphonia and had an increase in palpable subcu air.  The right sided pneumothorax was still small but had expanded from the previous day.  We were requested image guided right pleural tube placement by interventional radiology.

## 2023-09-25 NOTE — Interval H&P Note (Signed)
 History and Physical Interval Note:  09/25/2023 10:42 AM  Wyatt Berry  has presented today for surgery, with the diagnosis of LUNG CANCER.  The various methods of treatment have been discussed with the patient and family. After consideration of risks, benefits and other options for treatment, the patient has consented to  Procedure(s) with comments: LOBECTOMY, LUNG, ROBOT-ASSISTED, USING VATS (Right) - RIGHT MIDDLE LOBECTOMY as a surgical intervention.  The patient's history has been reviewed, patient examined, no change in status, stable for surgery.  I have reviewed the patient's chart and labs.  Questions were answered to the patient's satisfaction.     Juwana Thoreson Ala Alice

## 2023-09-26 ENCOUNTER — Encounter (HOSPITAL_COMMUNITY): Payer: Self-pay | Admitting: Thoracic Surgery (Cardiothoracic Vascular Surgery)

## 2023-09-26 ENCOUNTER — Inpatient Hospital Stay (HOSPITAL_COMMUNITY)

## 2023-09-26 LAB — BASIC METABOLIC PANEL WITH GFR
Anion gap: 7 (ref 5–15)
BUN: 14 mg/dL (ref 8–23)
CO2: 24 mmol/L (ref 22–32)
Calcium: 8.7 mg/dL — ABNORMAL LOW (ref 8.9–10.3)
Chloride: 105 mmol/L (ref 98–111)
Creatinine, Ser: 1.16 mg/dL (ref 0.61–1.24)
GFR, Estimated: >60 mL/min (ref >60)
Glucose, Bld: 128 mg/dL — ABNORMAL HIGH (ref 70–99)
Potassium: 4.3 mmol/L (ref 3.5–5.1)
Sodium: 136 mmol/L (ref 135–145)

## 2023-09-26 LAB — CBC
HCT: 40.1 % (ref 39.0–52.0)
Hemoglobin: 13.3 g/dL (ref 13.0–17.0)
MCH: 29.6 pg (ref 26.0–34.0)
MCHC: 33.2 g/dL (ref 30.0–36.0)
MCV: 89.1 fL (ref 80.0–100.0)
Platelets: 216 10*3/uL (ref 150–400)
RBC: 4.5 MIL/uL (ref 4.22–5.81)
RDW: 14.7 % (ref 11.5–15.5)
WBC: 8.9 10*3/uL (ref 4.0–10.5)
nRBC: 0 % (ref 0.0–0.2)

## 2023-09-26 MED ORDER — ALBUTEROL SULFATE (2.5 MG/3ML) 0.083% IN NEBU
2.5000 mg | INHALATION_SOLUTION | RESPIRATORY_TRACT | Status: DC | PRN
  Filled 2023-09-26: qty 3

## 2023-09-26 NOTE — TOC Initial Note (Signed)
 Transition of Care [facility]) - Initial/Assessment Note    Patient Details  Name: Wyatt Berry MRN: [id number] Date of Birth: April 23, 1951  Transition of [facility]) CM/SW Contact:    Juliane Och, LCSW Phone Number: 09/26/2023, 2:58 PM  Clinical Narrative:                  2:58 PM CSW introduced self and role to patient at bedside. Patient stated that he resides with spouse who could provide transportation and home support if needed upon discharge. Patient denied SNF/HH/DME history. Patient confirmed insurance and PCP. CSW followed up on SDOH needs (social connections) and offered resources. Patient declined CSW offer.  Expected Discharge Plan: Home/Self Care Barriers to Discharge: Continued Medical Work up   Patient Goals and CMS Choice Patient states their goals for this hospitalization and ongoing recovery are:: to return gome          Expected Discharge Plan and Services       Living arrangements for the past 2 months: Single Family Home                                      Prior Living Arrangements/Services Living arrangements for the past 2 months: Single Family Home Lives with:: Spouse Patient language and need for interpreter reviewed:: Yes Do you feel safe going back to the place where you live?: Yes        Care giver support system in place?: Yes (comment)   Criminal Activity/Legal Involvement Pertinent to Current Situation/Hospitalization: No - Comment as needed  Activities of Daily Living   ADL Screening (condition at time of admission) Independently performs ADLs?: Yes (appropriate for developmental age) Is the patient deaf or have difficulty hearing?: No Does the patient have difficulty seeing, even when wearing glasses/contacts?: No Does the patient have difficulty concentrating, remembering, or making decisions?: No  Permission Sought/Granted Permission sought to share information with : Family Supports Permission granted to share  information with : No (Contact information on chart)  Share Information with NAME: Flavius Witmer     Permission granted to share info w Relationship: Spouse  Permission granted to share info w Contact Information: [phone]  Emotional Assessment Appearance:: Appears stated age Attitude/Demeanor/Rapport: Engaged Affect (typically observed): Accepting, Adaptable, Stable, Appropriate, Calm Orientation: : Oriented to Situation, Oriented to Self, Oriented to  Time, Oriented to Place Alcohol / Substance Use: Not Applicable Psych Involvement: No (comment)  Admission diagnosis:  Malignant neoplasm of middle lobe of right lung (HCC) [C34.2] Malignant neoplasm of middle lobe of lung (HCC) [C34.2] S/P lobectomy of lung [Z90.2] Patient Active Problem List   Diagnosis Date Noted   Malignant neoplasm of middle lobe of lung (HCC) 09/25/2023   S/P lobectomy of lung 09/25/2023   Lung cancer (HCC) 08/22/2023   Lung nodule 02/24/2022   History of pulmonary embolism 09/24/2020   History of deep vein thrombosis (DVT) of lower extremity 09/24/2020   Hepatitis C 05/02/2016   Essential hypertension 05/02/2016   Hyponatremia 05/02/2016   Acute respiratory failure with hypoxia (HCC) 05/02/2016   Syncope and collapse    PCP:  [name], MD Pharmacy:   [facility] [phone] - [place], [place] - [address] AT [facility] OF [address] & [address] [place] [address] Phone: [phone] Fax: [phone]     Social Drivers of Health (SDOH) Social History: SDOH Screenings  Food Insecurity: No Food Insecurity (09/25/2023)  Housing: Low Risk  (09/25/2023)  Transportation Needs: No Transportation Needs (09/25/2023)  Utilities: Not At Risk (09/25/2023)  Social Connections: Socially Isolated (09/25/2023)  Tobacco Use: High Risk (09/25/2023)   SDOH Interventions: Social Connections Interventions: Patient Declined   Readmission Risk Interventions     No data to  display

## 2023-09-26 NOTE — Progress Notes (Signed)
 Mobility Specialist Progress Note;   09/26/23 0944  Mobility  Activity Ambulated with assistance in hallway  Level of Assistance Standby assist, set-up cues, supervision of patient - no hands on  Assistive Device Front wheel walker  Distance Ambulated (ft) 300 ft  Activity Response Tolerated well  Mobility Referral Yes  Mobility visit 1 Mobility  Mobility Specialist Start Time (ACUTE ONLY) 0944  Mobility Specialist Stop Time (ACUTE ONLY) 0959  Mobility Specialist Time Calculation (min) (ACUTE ONLY) 15 min   Pt agreeable to mobility. Required no physical assistance during ambulation, SV. Attempted ambulation on RA, however SPO2 desat to 86% requiring 3LO2 to stay [facility]. No c/o when asked. Pt returned back to bed with all needs met, alarm on. RN notified.   Janit Meline Mobility Specialist Please contact via SecureChat or [employer] [phone]

## 2023-09-26 NOTE — Plan of Care (Signed)

## 2023-09-26 NOTE — Progress Notes (Addendum)
 1 Day Post-Op Procedure(s) (LRB): LOBECTOMY, LUNG, ROBOT-ASSISTED, USING VATS (Right) BLOCK, NERVE, INTERCOSTAL (Right) Subjective: Sitting up eating breakfast. Pain controlled, no shortness of breath. Using the IS and pulling 750cc.  Objective: Vital signs in last 24 hours: Temp:  [97.4 F (36.3 C)-98.9 F (37.2 C)] (P) 98.3 F (36.8 C) (05/06 0753) Pulse Rate:  [69-85] (P) 77 (05/06 0753) Cardiac Rhythm: Normal sinus rhythm (05/05 1900) Resp:  [15-20] 18 (05/06 0753) BP: (122-150)/(69-87) 123/72 (05/06 0420) SpO2:  [92 %-98 %] 96 % (05/06 0737) Arterial Line BP: (176-177)/(68-69) 176/68 (05/05 1545) Weight:  [103.9 kg] 103.9 kg (05/05 0908)     Intake/Output from previous day: 05/05 0701 - 05/06 0700 In: 2096.9 [P.Wyatt.:120; I.V.:1600; IV Piggyback:376.9] Out: 1400 [Urine:1220; Blood:50; Chest Tube:130] Intake/Output this shift: No intake/output data recorded.  General appearance: alert, cooperative, and no distress Neurologic: intact Heart: RRR, NSR on monitor Lungs: normal work of breathing, adequate O2 sats on 2Lnc O2. Has active air leak, minimal CT drainage.  Abdomen: soft, no tenderness Wound: has some bloody drainage around the chest tube insertion site. Port sites are dry   Lab Results: Recent Labs    09/26/23 0254  WBC 8.9  HGB 13.3  HCT 40.1  PLT 216   BMET:  Recent Labs    09/26/23 0254  NA 136  K 4.3  CL 105  CO2 24  GLUCOSE 128*  BUN 14  CREATININE 1.16  CALCIUM 8.7*    PT/INR: No results for input(s): "LABPROT", "INR" in the last 72 hours. ABG No results found for: "PHART", "HCO3", "TCO2", "ACIDBASEDEF", "O2SAT" CBG (last 3)  No results for input(s): "GLUCAP" in the last 72 hours.  Assessment/Plan: S/P Procedure(s) (LRB): LOBECTOMY, LUNG, ROBOT-ASSISTED, USING VATS (Right) BLOCK, NERVE, INTERCOSTAL (Right)  -POD1 right middle lobectomy.  Active air leak, will leave CT to water  seal. CXR without PTX but has right lateral subQ air.  Change dressing for bloody drainage.   -History of HTN-BP;'s 120's -130's. Resume his amlodipine  and Toprol today and hold off on Losartan  for now.   -Tobacco dependence- working on smoking cessation. Resume Chantix .  -H/Wyatt BPH- resume Flomax .  -DVT PPX- enoxaparin .      LOS: 1 day    Wyatt G. Roddenberry, PA-C 09/26/2023  IS, ambulation Continue chest tube management Wyatt Berry Wyatt Berry

## 2023-09-27 ENCOUNTER — Inpatient Hospital Stay (HOSPITAL_COMMUNITY)

## 2023-09-27 LAB — COMPREHENSIVE METABOLIC PANEL WITH GFR
ALT: 14 U/L (ref 0–44)
AST: 33 U/L (ref 15–41)
Albumin: 3.5 g/dL (ref 3.5–5.0)
Alkaline Phosphatase: 44 U/L (ref 38–126)
Anion gap: 8 (ref 5–15)
BUN: 12 mg/dL (ref 8–23)
CO2: 25 mmol/L (ref 22–32)
Calcium: 8.5 mg/dL — ABNORMAL LOW (ref 8.9–10.3)
Chloride: 106 mmol/L (ref 98–111)
Creatinine, Ser: 1.02 mg/dL (ref 0.61–1.24)
GFR, Estimated: >60 mL/min (ref >60)
Glucose, Bld: 106 mg/dL — ABNORMAL HIGH (ref 70–99)
Potassium: 4.3 mmol/L (ref 3.5–5.1)
Sodium: 139 mmol/L (ref 135–145)
Total Bilirubin: 0.8 mg/dL (ref 0.0–1.2)
Total Protein: 6.8 g/dL (ref 6.5–8.1)

## 2023-09-27 LAB — CBC
HCT: 41.2 % (ref 39.0–52.0)
Hemoglobin: 13.6 g/dL (ref 13.0–17.0)
MCH: 29.8 pg (ref 26.0–34.0)
MCHC: 33 g/dL (ref 30.0–36.0)
MCV: 90.2 fL (ref 80.0–100.0)
Platelets: 177 10*3/uL (ref 150–400)
RBC: 4.57 MIL/uL (ref 4.22–5.81)
RDW: 14.7 % (ref 11.5–15.5)
WBC: 8.5 10*3/uL (ref 4.0–10.5)
nRBC: 0 % (ref 0.0–0.2)

## 2023-09-27 MED ORDER — MAGNESIUM SULFATE 2 GM/50ML IV SOLN
INTRAVENOUS | Status: AC
  Filled 2023-09-27: qty 50

## 2023-09-27 NOTE — Progress Notes (Signed)
 Mobility Specialist Progress Note;   09/27/23 0944  Mobility  Activity Ambulated with assistance in hallway  Level of Assistance Standby assist, set-up cues, supervision of patient - no hands on  Assistive Device Front wheel walker  Distance Ambulated (ft) 350 ft  Activity Response Tolerated well  Mobility Referral Yes  Mobility visit 1 Mobility  Mobility Specialist Start Time (ACUTE ONLY) 0944  Mobility Specialist Stop Time (ACUTE ONLY) 1000  Mobility Specialist Time Calculation (min) (ACUTE ONLY) 16 min   Pt agreeable to mobility. Required no physical assistance during ambulation, SV. Ambulated on 3LO2 to stay [facility] C/o feeling a bit winded w/ ambulation. HR up to 125 bpm w/ activity. Pt returned back to bed with all needs met, alarm on.   Wyatt Berry Mobility Specialist Please contact via SecureChat or [employer] [phone]

## 2023-09-27 NOTE — Plan of Care (Signed)
  Problem: Clinical Measurements: Goal: Will remain free from infection Outcome: Progressing Goal: Diagnostic test results will improve Outcome: Progressing Goal: Respiratory complications will improve Outcome: Progressing Goal: Cardiovascular complication will be avoided Outcome: Progressing   Problem: Nutrition: Goal: Adequate nutrition will be maintained Outcome: Progressing   Problem: Coping: Goal: Level of anxiety will decrease Outcome: Progressing   Problem: Safety: Goal: Ability to remain free from injury will improve Outcome: Progressing   Problem: Education: Goal: Knowledge of disease or condition will improve Outcome: Progressing   Problem: Respiratory: Goal: Respiratory status will improve Outcome: Progressing   Problem: Pain Management: Goal: Pain level will decrease Outcome: Progressing   Problem: Skin Integrity: Goal: Wound healing without signs and symptoms infection will improve Outcome: Progressing

## 2023-09-27 NOTE — Plan of Care (Signed)
  Problem: Education: Goal: Knowledge of General Education information will improve Description: Including pain rating scale, medication(s)/side effects and non-pharmacologic comfort measures Outcome: Progressing   Problem: Health Behavior/Discharge Planning: Goal: Ability to manage health-related needs will improve Outcome: Progressing   Problem: Clinical Measurements: Goal: Ability to maintain clinical measurements within normal limits will improve Outcome: Progressing Goal: Will remain free from infection Outcome: Progressing Goal: Diagnostic test results will improve Outcome: Progressing Goal: Respiratory complications will improve Outcome: Progressing Goal: Cardiovascular complication will be avoided Outcome: Progressing   Problem: Activity: Goal: Risk for activity intolerance will decrease Outcome: Progressing   Problem: Nutrition: Goal: Adequate nutrition will be maintained Outcome: Progressing   Problem: Coping: Goal: Level of anxiety will decrease Outcome: Progressing   Problem: Elimination: Goal: Will not experience complications related to bowel motility Outcome: Progressing Goal: Will not experience complications related to urinary retention Outcome: Progressing   Problem: Pain Managment: Goal: General experience of comfort will improve and/or be controlled Outcome: Progressing   Problem: Safety: Goal: Ability to remain free from injury will improve Outcome: Progressing   Problem: Skin Integrity: Goal: Risk for impaired skin integrity will decrease Outcome: Progressing   Problem: Education: Goal: Knowledge of disease or condition will improve Outcome: Progressing Goal: Knowledge of the prescribed therapeutic regimen will improve Outcome: Progressing   Problem: Activity: Goal: Risk for activity intolerance will decrease Outcome: Progressing   Problem: Cardiac: Goal: Will achieve and/or maintain hemodynamic stability Outcome: Progressing    Problem: Clinical Measurements: Goal: Postoperative complications will be avoided or minimized Outcome: Progressing   Problem: Pain Management: Goal: Pain level will decrease Outcome: Progressing   Problem: Skin Integrity: Goal: Wound healing without signs and symptoms infection will improve Outcome: Progressing

## 2023-09-27 NOTE — Plan of Care (Signed)
  Problem: Education: Goal: Knowledge of General Education information will improve Description: Including pain rating scale, medication(s)/side effects and non-pharmacologic comfort measures Outcome: Progressing   Problem: Health Behavior/Discharge Planning: Goal: Ability to manage health-related needs will improve Outcome: Progressing   Problem: Clinical Measurements: Goal: Respiratory complications will improve Outcome: Progressing   Problem: Activity: Goal: Risk for activity intolerance will decrease Outcome: Progressing   Problem: Nutrition: Goal: Adequate nutrition will be maintained Outcome: Progressing   Problem: Coping: Goal: Level of anxiety will decrease Outcome: Progressing   Problem: Elimination: Goal: Will not experience complications related to bowel motility Outcome: Progressing Goal: Will not experience complications related to urinary retention Outcome: Progressing   Problem: Pain Managment: Goal: General experience of comfort will improve and/or be controlled Outcome: Progressing   Problem: Activity: Goal: Risk for activity intolerance will decrease Outcome: Progressing   Problem: Clinical Measurements: Goal: Postoperative complications will be avoided or minimized Outcome: Progressing

## 2023-09-27 NOTE — Progress Notes (Addendum)
 2 Days Post-Op Procedure(s) (LRB): LOBECTOMY, LUNG, ROBOT-ASSISTED, USING VATS (Right) BLOCK, NERVE, INTERCOSTAL (Right) Subjective: Sitting up eating breakfast. Pain controlled, no shortness of breath. Using the IS and pulling 750cc.  Objective: Vital signs in last 24 hours: Temp:  [98.3 F (36.8 C)-99 F (37.2 C)] 99 F (37.2 C) (05/07 0725) Pulse Rate:  [69-91] 91 (05/07 0725) Cardiac Rhythm: Sinus tachycardia (05/07 0700) Resp:  [15-21] 21 (05/07 0725) BP: (110-150)/(56-81) 150/81 (05/07 0725) SpO2:  [93 %-98 %] 93 % (05/07 0725)     Intake/Output from previous day: 05/06 0701 - 05/07 0700 In: 240 [P.O.:240] Out: 1405 [Urine:1400; Chest Tube:5] Intake/Output this shift: No intake/output data recorded.  General appearance: alert, cooperative, and no distress Neurologic: intact Heart: RRR, NSR on monitor Lungs: normal work of breathing, adequate O2 sats on 2Lnc O2. Has active air leak, minimal CT drainage.  Abdomen: soft, no tenderness Wound: has some bloody drainage around the chest tube insertion site. Port sites are dry   Lab Results: Recent Labs    09/26/23 0254 09/27/23 0245  WBC 8.9 8.5  HGB 13.3 13.6  HCT 40.1 41.2  PLT 216 177   BMET:  Recent Labs    09/26/23 0254 09/27/23 0245  NA 136 139  K 4.3 4.3  CL 105 106  CO2 24 25  GLUCOSE 128* 106*  BUN 14 12  CREATININE 1.16 1.02  CALCIUM 8.7* 8.5*    PT/INR: No results for input(s): "LABPROT", "INR" in the last 72 hours. ABG No results found for: "PHART", "HCO3", "TCO2", "ACIDBASEDEF", "O2SAT" CBG (last 3)  No results for input(s): "GLUCAP" in the last 72 hours.  Assessment/Plan: S/P Procedure(s) (LRB): LOBECTOMY, LUNG, ROBOT-ASSISTED, USING VATS (Right) BLOCK, NERVE, INTERCOSTAL (Right)  -POD2 right middle lobectomy.  Minimal chest tube drainage. Active air leak unchanged from yesterday, will leave CT to water  seal. CXR with trace right apical PTX, mill subQ air. CT dressing is dry today.    -History of HTN-BP;'s reasonable on his amlodipine  and Toprol. Continue to hold off on Losartan  for now.   -Tobacco dependence- working on smoking cessation. Resumed Chantix .  -H/O BPH- resume Flomax . Voiding OK.  -DVT PPX- enoxaparin .    LOS: 2 days    Leata Providence, PA-C 09/27/2023  Agree Leak with cough IS, ambulation   Denasia Venn O Lusero Nordlund

## 2023-09-28 ENCOUNTER — Inpatient Hospital Stay (HOSPITAL_COMMUNITY)

## 2023-09-28 LAB — SURGICAL PATHOLOGY

## 2023-09-28 MED ORDER — LOSARTAN POTASSIUM 50 MG PO TABS
100.0000 mg | ORAL_TABLET | Freq: Every day | ORAL | Status: DC
  Administered 2023-09-28 – 2023-10-05 (×8): 100 mg via ORAL
  Filled 2023-09-28 (×8): qty 2

## 2023-09-28 MED ORDER — LACTULOSE 10 GM/15ML PO SOLN
20.0000 g | Freq: Every day | ORAL | Status: DC | PRN
  Administered 2023-09-29: 20 g via ORAL
  Filled 2023-09-28: qty 30

## 2023-09-28 NOTE — Care Management Important Message (Signed)
 Important Message  Patient Details  Name: Wyatt Berry MRN: [id number] Date of Birth: [birth year]   Important Message Given:  Yes - Medicare IM     [name] 09/28/2023, 4:12 PM

## 2023-09-28 NOTE — Discharge Summary (Signed)
 Physician Discharge Summary  Patient ID: Wyatt Berry MRN: [id number] DOB/AGE: Jun 01, 1950 73 y.o.  Admit date: 09/25/2023 Discharge date: 10/05/2023  Admission Diagnoses:  Non-small cell carcinoma right middle lobe Tobacco abuse History of DVT History of pulmonary embolus History of hepatitis C Hypertension  Discharge Diagnoses:   Non-small cell carcinoma right middle lobe Status post robotic assisted right middle lobectomy with lymph node sampling Tobacco abuse History of DVT History of pulmonary embolus History of hepatitis C Hypertension Pneumothorax  Discharged Condition: stable  History of Present Illness:  Wyatt Berry is a 73 yo male with known history of Nicotine Abuse, Hepatitis C, Hyponatremia, H/O of pulmonary embolism, H/O DVT, and lung nodule.  He underwent bronchoscopy with biopsy which proved the lesion to be non-small cell carcinoma.  Further workup showed no evidence of metastasis on PET CT scan..  Brain MRI is pending.  He was referred to Dr. Deloise Ferries for surgical evaluation.  It was recommended the patient undergo Robotic Assisted Right Middle Lobectomy. The risks and benefits of the procedure were explained to the patient and he was agreeable to proceed.  Hospital Course:  Wyatt Berry presented to [facility] on 09/25/2023.  He was taken to the operating room and underwent Robotic Assisted Right Video Thoracoscopy with right middle lobectomy and intercostal nerve block.  He tolerated the procedure without difficulty, was extubated and taken to the PACU in stable condition and was later transferred to [facility].  Vital signs and respiratory status remained stable.  He had minimal chest tube drainage that tapered off as expected however first 48 hours.  He had a persistent air leak that was also expected given the degree of adhesions encountered at the time of surgery.  Pain control is adequate with oral analgesics.  Wyatt Berry was mobilized.   Diet was advanced and well-tolerated. The air leak had resolved by post-op day 4 and after a successful clamp trial the chest tube was removed.  We continued to work on pulmonary hygiene and O2 wean.  Follow up CXR on post-op day 5 showed an increase in subcu air and a small developing pneumothorax.  We consulted interventional radiology team who replaced a small bore pigtail catheter in the right pleural space.  Over the next few days, the subcu air subsiding and pneumothorax resolved.  After successful clamp trial 24-hour period, the chest tube was removed.  Subcu air was improving on exam and his respiratory status was stable with adequate oxygen saturation on room air.Wyatt Berry  He was felt to stable for discharge to home on this date. The incisions were dry and healing with no sign of complication.    Consults: None  Significant Diagnostic Studies: CLINICAL DATA:  Right pneumothorax   EXAM: PORTABLE CHEST 1 VIEW   COMPARISON:  Oct 04, 2023   FINDINGS: Right pleural catheter along the right costophrenic sulcus. No residual pneumothorax. Suggestion of small pneumomediastinum along the right hilar region. Significant subcutaneous emphysema   Right pleural reaction basilar hypoventilatory atelectasis   Heart normal size   IMPRESSION: Right pleural catheter along the right costophrenic sulcus. No residual pneumothorax. Suggestion of small pneumomediastinum along the right hilar region.     Electronically Signed   By: Fredrich Jefferson M.D.   On: 10/05/2023 08:15  Treatments: Surgery  09/25/2023   Patient:  Wyatt Berry Pre-Op Dx: Right middle lobe NSCLC   Post-op Dx:  same Procedure: - Robotic assisted right video thoracoscopy - lysis of adhesion, complicating the case  by 25% - right lobectomy - Mediastinal lymph node sampling - Intercostal nerve block   Surgeon and Role:      * Lightfoot, Marinell Siad, MD - Primary   Assistant: Wilburt Hands, PA-C  An experienced assistant was  required given the complexity of this surgery and the standard of surgical care. The assistant was needed for exposure, dissection, suctioning, retraction of delicate tissues and sutures, instrument exchange and for overall help during this procedure.     Anesthesia  general EBL:  150 ml Blood Administration: none Specimen:  right middle lobe and mediastinal nodes   Drains: 28 F argyle chest tube in right chest Counts: correct     Indications: 73 yo male with 3.2cm RML adenocarcinoma.  PET/CT does not show any distant disease.  His pulmonary functions are adequate.  We discussed the risks and benefits of a right robotic assisted thoracoscopy with right middle lobectomy.  He will completely stop smoking today.  He is agreeable to proceed.       Findings: Dense adhesion, and firm lymph nodes in the hilum    Discharge Exam: Blood pressure (!) 156/85, pulse 95, temperature 98.7 F (37.1 C), temperature source Oral, resp. rate (!) 21, height 6\' 1"  (1.854 m), weight 103.9 kg, SpO2 97%. General appearance: alert, cooperative, and no distress.  Dysphonia resolved.  Neurologic: intact Heart: RRR, NSR on monitor with few PAC's Lungs: normal work of breathing. SubQ air improving. CXR showing no PTX this morning.   Abdomen: soft, no tenderness Wound:  Port sites are dry and intact.    Disposition:  Discharged to home in stable condition.      Signed: Lovette Merta G. Dametrius Sanjuan, PA-C 09/28/2023, 3:16 PM

## 2023-09-28 NOTE — Plan of Care (Signed)
  Problem: Education: Goal: Knowledge of General Education information will improve Description: Including pain rating scale, medication(s)/side effects and non-pharmacologic comfort measures Outcome: Progressing   Problem: Clinical Measurements: Goal: Ability to maintain clinical measurements within normal limits will improve Outcome: Progressing Goal: Will remain free from infection Outcome: Progressing Goal: Respiratory complications will improve Outcome: Progressing   Problem: Nutrition: Goal: Adequate nutrition will be maintained Outcome: Progressing   Problem: Coping: Goal: Level of anxiety will decrease Outcome: Progressing   Problem: Elimination: Goal: Will not experience complications related to bowel motility Outcome: Progressing Goal: Will not experience complications related to urinary retention Outcome: Progressing   Problem: Pain Managment: Goal: General experience of comfort will improve and/or be controlled Outcome: Progressing   Problem: Education: Goal: Knowledge of disease or condition will improve Outcome: Progressing Goal: Knowledge of the prescribed therapeutic regimen will improve Outcome: Progressing   Problem: Cardiac: Goal: Will achieve and/or maintain hemodynamic stability Outcome: Progressing   Problem: Clinical Measurements: Goal: Postoperative complications will be avoided or minimized Outcome: Progressing   Problem: Pain Management: Goal: Pain level will decrease Outcome: Progressing

## 2023-09-28 NOTE — Progress Notes (Signed)
 Mobility Specialist Progress Note;   09/28/23 1420  Mobility  Activity Transferred from chair to bed  Level of Assistance Contact guard assist, steadying assist  Assistive Device None  Distance Ambulated (ft) 5 ft  Activity Response Tolerated well  Mobility Referral Yes  Mobility visit 1 Mobility  Mobility Specialist Start Time (ACUTE ONLY) 1420  Mobility Specialist Stop Time (ACUTE ONLY) 1430  Mobility Specialist Time Calculation (min) (ACUTE ONLY) 10 min   Pt requesting assistance back to bed. Required MinG assistance to safely transfer pt from chair to bed. VSS throughout and no c/o. Pt left in bed with all needs met, call bell in reach.   Janit Meline Mobility Specialist Please contact via SecureChat or [employer] [phone]

## 2023-09-28 NOTE — Progress Notes (Signed)
 Mobility Specialist Progress Note;   09/28/23 1003  Mobility  Activity Ambulated with assistance in hallway  Level of Assistance Standby assist, set-up cues, supervision of patient - no hands on  Assistive Device Front wheel walker  Distance Ambulated (ft) 400 ft  Activity Response Tolerated well  Mobility Referral Yes  Mobility visit 1 Mobility  Mobility Specialist Start Time (ACUTE ONLY) 1003  Mobility Specialist Stop Time (ACUTE ONLY) 1021  Mobility Specialist Time Calculation (min) (ACUTE ONLY) 18 min   Pt agreeable to mobility. On 1LO2 upon arrival. Required no physical assistance during ambulation, SV. Attempted ambulation on RA, however pt required 0.5LO2 to stay [facility]. No c/o when asked. Pt left sitting in chair with all needs met. RN in room.   Janit Meline Mobility Specialist Please contact via SecureChat or [employer] [phone]

## 2023-09-28 NOTE — Progress Notes (Addendum)
 3 Days Post-Op Procedure(s) (LRB): LOBECTOMY, LUNG, ROBOT-ASSISTED, USING VATS (Right) BLOCK, NERVE, INTERCOSTAL (Right) Subjective: Sitting up eating breakfast. Pain controlled, no shortness of breath. Walked a lap in the [place] yesterday without any problems.   Objective: Vital signs in last 24 hours: Temp:  [98.6 F (37 C)-99.3 F (37.4 C)] 98.6 F (37 C) (05/08 0418) Pulse Rate:  [79-99] 80 (05/08 0418) Cardiac Rhythm: Normal sinus rhythm (05/07 1950) Resp:  [19-22] 20 (05/08 0418) BP: (132-171)/(72-78) 138/77 (05/08 0418) SpO2:  [93 %-98 %] 98 % (05/08 0418)   Intake/Output from previous day: 05/07 0701 - 05/08 0700 In: 480 [P.Wyatt.:480] Out: 1200 [Urine:1100; Chest Tube:100] Intake/Output this shift: No intake/output data recorded.  General appearance: alert, cooperative, and no distress Neurologic: intact Heart: RRR, NSR on monitor with few PAC's Lungs: normal work of breathing, adequate O2 sats on 2Lnc O2.  Air leak appears to have slowed some, minimal CT drainage. CXR pending.  Abdomen: soft, no tenderness Wound:  chest tube site and port sites are dry   Lab Results: Recent Labs    09/26/23 0254 09/27/23 0245  WBC 8.9 8.5  HGB 13.3 13.6  HCT 40.1 41.2  PLT 216 177   BMET:  Recent Labs    09/26/23 0254 09/27/23 0245  NA 136 139  K 4.3 4.3  CL 105 106  CO2 24 25  GLUCOSE 128* 106*  BUN 14 12  CREATININE 1.16 1.02  CALCIUM 8.7* 8.5*    PT/INR: No results for input(s): "LABPROT", "INR" in the last 72 hours. ABG No results found for: "PHART", "HCO3", "TCO2", "ACIDBASEDEF", "O2SAT" CBG (last 3)  No results for input(s): "GLUCAP" in the last 72 hours.  Assessment/Plan: S/P Procedure(s) (LRB): LOBECTOMY, LUNG, ROBOT-ASSISTED, USING VATS (Right) BLOCK, NERVE, INTERCOSTAL (Right)  -POD3 right middle lobectomy.  Minimal chest tube drainage. Air leak appears to be slowing, will leave CT to water  seal. CXR pending.   -History of HTN-BP's trending up.  On  his usual dosing of amlodipine  and Toprol. Add back his Losartan  today.   -Tobacco dependence- working on smoking cessation. Resumed Chantix .  -H/Wyatt BPH- resume Flomax . Voiding OK.  -DVT PPX- enoxaparin .    LOS: 3 days    Wyatt Berry, Wyatt Berry 09/28/2023  Agree Air leak improving Continue IS, and ambulation  Wyatt Berry Wyatt Berry

## 2023-09-29 ENCOUNTER — Inpatient Hospital Stay (HOSPITAL_COMMUNITY)

## 2023-09-29 LAB — TYPE AND SCREEN
ABO/RH(D): O POS
Antibody Screen: NEGATIVE
Unit division: 0
Unit division: 0

## 2023-09-29 LAB — BPAM RBC
Blood Product Expiration Date: 202506022359
Blood Product Expiration Date: 202506022359
Unit Type and Rh: 5100
Unit Type and Rh: 5100

## 2023-09-29 NOTE — Discharge Instructions (Signed)

## 2023-09-29 NOTE — Progress Notes (Signed)
 CXR obtained after having the chest tube clamped for ~4 hours. No PTX. No air leak when clamp removed a few minutes ago.  Will remove the chest tube and continue working on ambulation, pulmonary hygiene, and O2 wean. Possible discharge in AM if CXR OK and on RA.   Pat Bonier, PA-C

## 2023-09-29 NOTE — Progress Notes (Signed)
 Mobility Specialist Progress Note:   09/29/23 0900  Oxygen Therapy  O2 Device Nasal Cannula  O2 Flow Rate (L/min) 0.5 L/min  Mobility  Activity Ambulated with assistance in hallway  Level of Assistance Standby assist, set-up cues, supervision of patient - no hands on  Assistive Device Front wheel walker  Distance Ambulated (ft) 400 ft  Activity Response Tolerated well  Mobility Referral Yes  Mobility visit 1 Mobility  Mobility Specialist Start Time (ACUTE ONLY) [id number]  Mobility Specialist Stop Time (ACUTE ONLY) 0919  Mobility Specialist Time Calculation (min) (ACUTE ONLY) 13 min    Pre Mobility:  91% SpO2 During Mobility: 93% SpO2 Post Mobility:  92% SpO2  Received pt in bed having no complaints and agreeable to mobility. Pt was asymptomatic throughout ambulation and returned to room w/o fault. Left in chair w/ call bell in reach and all needs met.   D'Vante Nolon Baxter Mobility Specialist Please contact via Special educational needs teacher or Rehab office at [phone]

## 2023-09-29 NOTE — Plan of Care (Signed)

## 2023-09-29 NOTE — Plan of Care (Signed)

## 2023-09-29 NOTE — Progress Notes (Signed)
 4 Days Post-Op Procedure(s) (LRB): LOBECTOMY, LUNG, ROBOT-ASSISTED, USING VATS (Right) BLOCK, NERVE, INTERCOSTAL (Right) Subjective: Sitting up eating breakfast. Feels good, no shortness of breath.  Continues to progress with mobility and is working with incentive spirometer.  Objective: Vital signs in last 24 hours: Temp:  [98.4 F (36.9 C)-99.1 F (37.3 C)] 98.7 F (37.1 C) (05/09 0718) Pulse Rate:  [79-96] 96 (05/09 0718) Cardiac Rhythm: Sinus tachycardia;Normal sinus rhythm (05/09 0754) Resp:  [12-19] 12 (05/09 0718) BP: (128-157)/(75-83) 155/83 (05/09 0718) SpO2:  [95 %-98 %] 95 % (05/09 0718)   Intake/Output from previous day: 05/08 0701 - 05/09 0700 In: -  Out: 2180 [Urine:2100; Chest Tube:80] Intake/Output this shift: Total I/O In: 120 [P.O.:120] Out: 250 [Urine:250]  General appearance: alert, cooperative, and no distress Neurologic: intact Heart: RRR, NSR on monitor with few PAC's Lungs: normal work of breathing, adequate O2 sats on 1Lnc O2.  No airleak.  Chest x-ray shows tube properly positioned, no pneumothorax.  Abdomen: soft, no tenderness Wound:  chest tube site and port sites are dry   Lab Results: Recent Labs    09/27/23 0245  WBC 8.5  HGB 13.6  HCT 41.2  PLT 177   BMET:  Recent Labs    09/27/23 0245  NA 139  K 4.3  CL 106  CO2 25  GLUCOSE 106*  BUN 12  CREATININE 1.02  CALCIUM  8.5*    PT/INR: No results for input(s): "LABPROT", "INR" in the last 72 hours. ABG No results found for: "PHART", "HCO3", "TCO2", "ACIDBASEDEF", "O2SAT" CBG (last 3)  No results for input(s): "GLUCAP" in the last 72 hours.  CLINICAL DATA:  [id number] Pneumothorax on right [id number], recent lobectomy   EXAM: PORTABLE CHEST - 1 VIEW   COMPARISON:  None available.   FINDINGS: Similarly positioned right-sided thoracostomy tube terminating in the upper right lung zone. Small right pleural effusion with right basilar atelectasis. No pneumothorax. Minimal left  basilar atelectasis. No cardiomegaly. Extensive subcutaneous emphysema, unchanged.   IMPRESSION: Similarly positioned right thoracostomy tube without visualized pneumothorax.     Electronically Signed   By: Rance Burrows M.D.   On: 09/29/2023 10:25  Assessment/Plan: S/P Procedure(s) (LRB): LOBECTOMY, LUNG, ROBOT-ASSISTED, USING VATS (Right) BLOCK, NERVE, INTERCOSTAL (Right)  -POD4 right middle lobectomy.  Minimal chest tube drainage.  There is no airleak this morning and the chest x-ray shows no pneumothorax.  Since this is a significant change from moderate and persistent airleak yesterday to no airleak today, we will do a clamp trial before removing the tube.  Chest tube clamped at 08:30, chest x-ray 12 noon today  -History of HTN-BP's trending up.  On his usual dosing of amlodipine , Toprol , and losartan .  Control is adequate.    -Tobacco dependence- working on smoking cessation. Resumed Chantix .  -H/O BPH- resume Flomax . Voiding OK.  -DVT PPX- enoxaparin .    LOS: 4 days    Sharleen Szczesny G. Cheyenna Pankowski, PA-C 09/29/2023

## 2023-09-30 ENCOUNTER — Inpatient Hospital Stay (HOSPITAL_COMMUNITY)

## 2023-09-30 NOTE — Progress Notes (Signed)
 Mobility Specialist Progress Note:    09/30/23 0756  Mobility  Activity Ambulated with assistance in room;Ambulated with assistance in hallway  Level of Assistance Standby assist, set-up cues, supervision of patient - no hands on  Assistive Device Front wheel walker  Distance Ambulated (ft) 400 ft  Activity Response Tolerated well  Mobility Referral Yes  Mobility visit 1 Mobility  Mobility Specialist Start Time (ACUTE ONLY) 0756  Mobility Specialist Stop Time (ACUTE ONLY) 0802  Mobility Specialist Time Calculation (min) (ACUTE ONLY) 6 min   Pt received in bed, agreeable to mobility session. Ambulated in hallway and room with RW and SV. Tolerated well, SpO2 91-94% on RA throughout. Max HR 135 bpm. Tolerated well. Returned pt to room with all needs met, call bell in reach.   Wyatt Berry Mobility Specialist Please contact via Special educational needs teacher or  Rehab office at [phone]

## 2023-09-30 NOTE — Plan of Care (Signed)

## 2023-09-30 NOTE — Progress Notes (Addendum)
 5 Days Post-Op Procedure(s) (LRB): LOBECTOMY, LUNG, ROBOT-ASSISTED, USING VATS (Right) BLOCK, NERVE, INTERCOSTAL (Right) Subjective: Up in bedside chair after walking in the halls earlier this morning.  Feels well, no concerns.  Denies chest pain or shortness of breath.  Objective: Vital signs in last 24 hours: Temp:  [97.7 F (36.5 C)-98.5 F (36.9 C)] 98.5 F (36.9 C) (05/10 0709) Pulse Rate:  [72-106] 106 (05/10 0835) Cardiac Rhythm: Normal sinus rhythm (05/10 0659) Resp:  [14-20] 20 (05/10 0709) BP: (128-158)/(66-89) 143/85 (05/10 0835) SpO2:  [93 %-96 %] 94 % (05/10 0709)   Intake/Output from previous day: 05/09 0701 - 05/10 0700 In: 1320 [P.O.:1320] Out: 250 [Urine:250] Intake/Output this shift: Total I/O In: 720 [P.O.:720] Out: 300 [Urine:300]  General appearance: alert, cooperative, and no distress Neurologic: intact Heart: RRR, NSR on monitor with few PAC's Lungs: normal work of breathing, adequate O2 sats 93 to 95% on room air.  Chest x-ray shows tiny apical and lateral pneumothorax.  There is a significant increase in the subcu air on the chest x-ray.   Abdomen: soft, no tenderness Wound:  chest tube site and port sites are dry   Lab Results: No results for input(s): "WBC", "HGB", "HCT", "PLT" in the last 72 hours.  BMET:  No results for input(s): "NA", "K", "CL", "CO2", "GLUCOSE", "BUN", "CREATININE", "CALCIUM " in the last 72 hours.   PT/INR: No results for input(s): "LABPROT", "INR" in the last 72 hours. ABG No results found for: "PHART", "HCO3", "TCO2", "ACIDBASEDEF", "O2SAT" CBG (last 3)  No results for input(s): "GLUCAP" in the last 72 hours.   Assessment/Plan: S/P Procedure(s) (LRB): LOBECTOMY, LUNG, ROBOT-ASSISTED, USING VATS (Right) BLOCK, NERVE, INTERCOSTAL (Right)  -POD5 right middle lobectomy.  Chest tube removed yesterday after a successful clamp trial.  There was no airleak after being along for about 4 hours.  The chest x-ray this morning  shows very small apical and lateral pneumothorax along with increase in subcutaneous air is also palpable on exam.  Respiratory status is stable with no chest pain or shortness of breath on room air.  Will discuss with Dr. Luna Salinas.  May need to watch for another 24 hours and repeat chest x-ray in the morning  -History of HTN- blood pressure control reasonable.  On his usual dosing of amlodipine , Toprol , and losartan .    -Tobacco dependence- working on smoking cessation. Resumed Chantix .  -H/O BPH- resume Flomax . Voiding OK.  -DVT PPX- enoxaparin .    LOS: 5 days    Leata Providence, PA-C 09/30/2023  Patient seen and examined, d/w Mr. Frann Ivans CXR this AM shows a pneumothorax, increased SQ air on exam per [facility]. Despite that he looks and feels well, no respiratory distress Will keep for another 24 hours for observation- if worsens may need tube  Landon Pinion C. Luna Salinas, MD [facility] [phone]

## 2023-09-30 NOTE — Plan of Care (Signed)

## 2023-10-01 ENCOUNTER — Inpatient Hospital Stay (HOSPITAL_COMMUNITY)

## 2023-10-01 NOTE — Progress Notes (Signed)
 Mobility Specialist Progress Note:    10/01/23 1032  Mobility  Activity Ambulated with assistance in hallway;Ambulated with assistance in room  Level of Assistance Standby assist, set-up cues, supervision of patient - no hands on  Assistive Device Front wheel walker  Distance Ambulated (ft) 450 ft  Activity Response Tolerated well  Mobility Referral Yes  Mobility visit 1 Mobility  Mobility Specialist Start Time (ACUTE ONLY) 1032  Mobility Specialist Stop Time (ACUTE ONLY) 1042  Mobility Specialist Time Calculation (min) (ACUTE ONLY) 10 min   Pt received in chair, agreeable to mobility session. Ambulated in hallway with RW and SV. Tolerated well, asx throughout. SpO2 93% on RA, max HR 120 bpm. Returned pt to room, all needs met.   Wyatt Berry Mobility Specialist Please contact via Special educational needs teacher or  Rehab office at [phone]

## 2023-10-01 NOTE — Progress Notes (Addendum)
 6 Days Post-Op Procedure(s) (LRB): LOBECTOMY, LUNG, ROBOT-ASSISTED, USING VATS (Right) BLOCK, NERVE, INTERCOSTAL (Right) Subjective:  Sitting in the bedside chair, feels OK and says he is breathing about the same. Notes change in his voice starting yesterday afternoon.  Remains on RA  with O2 sat 92-94%.  Objective: Vital signs in last 24 hours: Temp:  [98.4 F (36.9 C)-99.2 F (37.3 C)] 98.7 F (37.1 C) (05/11 0733) Pulse Rate:  [86-121] 121 (05/11 0733) Cardiac Rhythm: Sinus tachycardia (05/11 0700) Resp:  [19-20] 20 (05/11 0733) BP: (122-145)/(62-83) 144/81 (05/11 0858) SpO2:  [92 %-95 %] 92 % (05/11 0733)   Intake/Output from previous day: 05/10 0701 - 05/11 0700 In: 2277 [P.O.:2277] Out: 975 [Urine:975] Intake/Output this shift: No intake/output data recorded.  General appearance: alert, cooperative, and no distress. Has mild dysphonia today.  Neurologic: intact Heart: RRR, NSR on monitor with few PAC's Lungs: normal work of breathing, adequate O2 sats . Has increase in right-side subQ air.  CXR also showing PTX and increasing subQ air.  Abdomen: soft, no tenderness Wound:  Has serous drainage from the CT insertion site (dressing changed).  Port sites are dry and intact.  Lab Results: No results for input(s): "WBC", "HGB", "HCT", "PLT" in the last 72 hours.  BMET:  No results for input(s): "NA", "K", "CL", "CO2", "GLUCOSE", "BUN", "CREATININE", "CALCIUM " in the last 72 hours.   PT/INR: No results for input(s): "LABPROT", "INR" in the last 72 hours. ABG No results found for: "PHART", "HCO3", "TCO2", "ACIDBASEDEF", "O2SAT" CBG (last 3)  No results for input(s): "GLUCAP" in the last 72 hours.   Assessment/Plan: S/P Procedure(s) (LRB): LOBECTOMY, LUNG, ROBOT-ASSISTED, USING VATS (Right) BLOCK, NERVE, INTERCOSTAL (Right)  -POD6 right middle lobectomy.  Chest tube removed 5/9 after a successful clamp trial.  Has worsening subQ air and PTX on CXR.  Need to replace a  right pleural tube.   -History of HTN- blood pressure control reasonable.  On his usual dosing of amlodipine , Toprol , and losartan .    -Tobacco dependence- working on smoking cessation. Resumed Chantix .  -H/O BPH- resume Flomax . Voiding OK.  -DVT PPX- enoxaparin .    LOS: 6 days    Leata Providence, PA-C 10/01/2023  Patient seen and examined, agree with findings and plan outlined above Increased SQ emphysema, probably larger basilar pneumothorax, but tolerating well.  Will see if IR can place a CT guided tube but would not attempt blind bedside placement at current time  Landon Pinion C. Luna Salinas, MD [facility] [phone]

## 2023-10-02 ENCOUNTER — Inpatient Hospital Stay (HOSPITAL_COMMUNITY)

## 2023-10-02 HISTORY — PX: IR PERC PLEURAL DRAIN W/INDWELL CATH W/IMG GUIDE: IMG5383

## 2023-10-02 LAB — CBC
HCT: 38.6 % — ABNORMAL LOW (ref 39.0–52.0)
Hemoglobin: 13.1 g/dL (ref 13.0–17.0)
MCH: 30.3 pg (ref 26.0–34.0)
MCHC: 33.9 g/dL (ref 30.0–36.0)
MCV: 89.1 fL (ref 80.0–100.0)
Platelets: 237 10*3/uL (ref 150–400)
RBC: 4.33 MIL/uL (ref 4.22–5.81)
RDW: 14.6 % (ref 11.5–15.5)
WBC: 7.6 10*3/uL (ref 4.0–10.5)
nRBC: 0 % (ref 0.0–0.2)

## 2023-10-02 LAB — PROTIME-INR
INR: 1.1 (ref 0.8–1.2)
Prothrombin Time: 14 s (ref 11.4–15.2)

## 2023-10-02 MED ORDER — MIDAZOLAM HCL 2 MG/2ML IJ SOLN
INTRAMUSCULAR | Status: AC | PRN
  Administered 2023-10-02 (×2): 1 mg via INTRAVENOUS

## 2023-10-02 MED ORDER — FENTANYL CITRATE (PF) 100 MCG/2ML IJ SOLN
INTRAMUSCULAR | Status: AC | PRN
  Administered 2023-10-02 (×2): 50 ug via INTRAVENOUS

## 2023-10-02 MED ORDER — LIDOCAINE-EPINEPHRINE 1 %-1:100000 IJ SOLN
INTRAMUSCULAR | Status: AC
  Filled 2023-10-02: qty 1

## 2023-10-02 MED ORDER — FENTANYL CITRATE (PF) 100 MCG/2ML IJ SOLN
INTRAMUSCULAR | Status: AC
  Filled 2023-10-02: qty 2

## 2023-10-02 MED ORDER — MIDAZOLAM HCL 2 MG/2ML IJ SOLN
INTRAMUSCULAR | Status: AC
  Filled 2023-10-02: qty 2

## 2023-10-02 MED ORDER — LIDOCAINE-EPINEPHRINE 1 %-1:100000 IJ SOLN
20.0000 mL | Freq: Once | INTRAMUSCULAR | Status: AC
  Administered 2023-10-02: 10 mL via INTRADERMAL

## 2023-10-02 NOTE — Procedures (Signed)
 Interventional Radiology Procedure Note  Procedure: FLUORO 12 FR RT CHEST TUBE    Complications: None  Estimated Blood Loss:  0  Findings: FULL REPORT IN PACS TUBE TO PLEUREVAC    Dayne Even, MD

## 2023-10-02 NOTE — Plan of Care (Signed)
  Problem: Education: Goal: Knowledge of General Education information will improve Description: Including pain rating scale, medication(s)/side effects and non-pharmacologic comfort measures Outcome: Progressing   Problem: Health Behavior/Discharge Planning: Goal: Ability to manage health-related needs will improve Outcome: Progressing   Problem: Clinical Measurements: Goal: Respiratory complications will improve Outcome: Progressing   Problem: Activity: Goal: Risk for activity intolerance will decrease Outcome: Progressing   Problem: Elimination: Goal: Will not experience complications related to bowel motility Outcome: Progressing   Problem: Activity: Goal: Risk for activity intolerance will decrease Outcome: Progressing   Problem: Cardiac: Goal: Will achieve and/or maintain hemodynamic stability Outcome: Progressing   Problem: Clinical Measurements: Goal: Postoperative complications will be avoided or minimized Outcome: Progressing   Problem: Respiratory: Goal: Respiratory status will improve Outcome: Progressing

## 2023-10-02 NOTE — Consult Note (Signed)
 Chief Complaint: Patient was seen in consultation today for pneumothorax   at the request of Roddenberry, Sherlene Diss, PA-C  Referring Physician(s): Roddenberry, Myron G, PA-C  Supervising Physician: Alyssa Jumper  Patient Status: [facility] - In-pt  Full Code  History of Present Illness: Wyatt Berry is a 73 y.o. male with history of AAA, bladder cancer, COPD, cirrhosis, DVT, hepatitis C, HLD, HTN, PE, and lung nodule-s/p right middle lobectomy 09/25/23. Patient recently had a chest tube removed 5/9 after clamping trial. Patient developed a right sided pneumothorax post removal. IR consulted to assist with a new right chest tube placement.   Patient reports feeling well today. Denies: chest pain, shortness of breath, nausea, vomiting, abdominal pain, diarrhea, and/or fever.   Past Medical History:  Diagnosis Date   AAA (abdominal aortic aneurysm) without rupture (HCC) 01/01/2021   3.7 cm infrarenal AAA on PET 02/2022   Cancer [facility])    bladder   Cirrhosis of liver (HCC)    COPD (chronic obstructive pulmonary disease) (HCC)    Per patient mild   DVT (deep venous thrombosis) (HCC) 05/02/2016   subacute LLE DVT 05/02/16   Hepatitis C    HCV, s/p treatment   HLD (hyperlipidemia)    Hypertension    Lung nodule 02/23/2022   left lung   PE (pulmonary thromboembolism) (HCC) 05/02/2016   bilateral at least submassive PE with right heart strain, s/p Xarelto  x 4 years   Pre-diabetes 12/2020   no meds    Past Surgical History:  Procedure Laterality Date   BRONCHIAL BIOPSY  03/22/2022   Procedure: BRONCHIAL BIOPSIES;  Surgeon: Prudy Brownie, DO;  Location: [facility];  Service: Pulmonary;;   BRONCHIAL BIOPSY  08/08/2023   Procedure: BRONCHOSCOPY, WITH BIOPSY;  Surgeon: Denson Flake, MD;  Location: [facility];  Service: Pulmonary;;   BRONCHIAL BRUSHINGS  08/08/2023   Procedure: BRONCHOSCOPY, WITH BRUSH BIOPSY;  Surgeon: Denson Flake, MD;  Location: [facility];  Service:  Pulmonary;;   BRONCHIAL NEEDLE ASPIRATION BIOPSY  03/22/2022   Procedure: BRONCHIAL NEEDLE ASPIRATION BIOPSIES;  Surgeon: Prudy Brownie, DO;  Location: [facility];  Service: Pulmonary;;   BRONCHIAL NEEDLE ASPIRATION BIOPSY  08/08/2023   Procedure: BRONCHOSCOPY, WITH NEEDLE ASPIRATION BIOPSY;  Surgeon: Denson Flake, MD;  [facility]: [facility];  Service: Pulmonary;;   COLONOSCOPY     CYSTOSCOPY W/ RETROGRADES Bilateral 06/14/2022   Procedure: CYSTOSCOPY WITH RETROGRADE PYELOGRAM;  Surgeon: Sherlyn Ditto, MD;  Location: [facility];  Service: Urology;  Laterality: Bilateral;   CYSTOSCOPY W/ RETROGRADES Bilateral 10/11/2022   Procedure: CYSTOSCOPY WITH RETROGRADE PYELOGRAM;  Surgeon: Sherlyn Ditto, MD;  Location: [facility];  Service: Urology;  Laterality: Bilateral;   INTERCOSTAL NERVE BLOCK Right 09/25/2023   Procedure: BLOCK, NERVE, INTERCOSTAL;  Surgeon: Hilarie Lovely, MD;  Location: [facility];  Service: Thoracic;  Laterality: Right;   LOBECTOMY, LUNG, ROBOT-ASSISTED, USING VATS Right 09/25/2023   Procedure: LOBECTOMY, LUNG, ROBOT-ASSISTED, USING VATS;  Surgeon: Hilarie Lovely, MD;  Location: [facility];  Service: Thoracic;  Laterality: Right;  RIGHT MIDDLE LOBECTOMY   TONSILLECTOMY     TRANSURETHRAL RESECTION OF BLADDER TUMOR N/A 06/14/2022   Procedure: TRANSURETHRAL RESECTION OF BLADDER TUMOR (TURBT), BLADDER BIOPSY;  Surgeon: Sherlyn Ditto, MD;  Location: [facility];  Service: Urology;  Laterality: N/A;  45 MINS   TRANSURETHRAL RESECTION OF BLADDER TUMOR N/A 10/11/2022   Procedure: TRANSURETHRAL RESECTION OF BLADDER TUMOR (TURBT);  Surgeon: Sherlyn Ditto, MD;  Location: [facility];  Service: Urology;  Laterality: N/A;  45 MINS   VIDEO BRONCHOSCOPY WITH ENDOBRONCHIAL NAVIGATION  08/08/2023   Procedure: VIDEO BRONCHOSCOPY WITH ENDOBRONCHIAL NAVIGATION;  Surgeon: Denson Flake, MD;  [facility]: [facility];  Service: Pulmonary;;   VIDEO BRONCHOSCOPY WITH ENDOBRONCHIAL ULTRASOUND Bilateral  03/22/2022   Procedure: VIDEO BRONCHOSCOPY WITH ENDOBRONCHIAL ULTRASOUND;  Surgeon: Prudy Brownie, DO;  Location: [facility];  Service: Pulmonary;  Laterality: Bilateral;    Allergies: Patient has no known allergies.  Medications: Prior to Admission medications   Medication Sig Start Date End Date Taking? Authorizing Provider  amLODipine  (NORVASC ) 5 MG tablet Take 5 mg by mouth daily.   Yes [provider]  aspirin  EC 81 MG tablet Take 81 mg by mouth daily.   Yes [provider]  atorvastatin  (LIPITOR) 10 MG tablet Take 10 mg by mouth daily. 01/16/22  Yes [provider]  losartan -hydrochlorothiazide  (HYZAAR) 100-12.5 MG per tablet Take 1 tablet by mouth daily.   Yes [provider]  metoprolol  succinate (TOPROL -XL) 25 MG 24 hr tablet Take 25 mg by mouth at bedtime. 01/27/22  Yes [provider]  Multiple Vitamin (MULTIVITAMIN) tablet Take 1 tablet by mouth daily.   Yes [provider]  tamsulosin  (FLOMAX ) 0.4 MG CAPS capsule Take 0.4 mg by mouth at bedtime.   Yes [provider]  varenicline  (CHANTIX ) 1 MG tablet Take 1 mg by mouth 2 (two) times daily. 07/06/23  Yes [provider]     Family History  Problem Relation Age of Onset   Diabetes Mother    Congestive Heart Failure Mother    Heart disease Father     Social History   Socioeconomic History   Marital status: Married    Spouse name: Not on file   Number of children: Not on file   Years of education: Not on file   Highest education level: Not on file  Occupational History   Not on file  Tobacco Use   Smoking status: Some Days    Current packs/day: 0.25    Average packs/day: 0.3 packs/day for 52.4 years (13.1 ttl pk-yrs)    Types: Cigarettes    Start date: 1973   Smokeless tobacco: Never   Tobacco comments:    Pt states that he smokes less than 5 cigarettes a day. 04/05/22 ALS   Vaping Use   Vaping status: Never Used  Substance and Sexual  Activity   Alcohol use: No   Drug use: No   Sexual activity: Not Currently  Other Topics Concern   Not on file  Social History Narrative   Not on file   Social Drivers of Health   Financial Resource Strain: Not on file  Food Insecurity: No Food Insecurity (09/25/2023)   Hunger Vital Sign    Worried About Running Out of Food in the Last Year: Never true    Ran Out of Food in the Last Year: Never true  Transportation Needs: No Transportation Needs (09/25/2023)   PRAPARE - Administrator, Civil Service (Medical): No    Lack of Transportation (Non-Medical): No  Physical Activity: Not on file  Stress: Not on file  Social Connections: Socially Isolated (09/25/2023)   Social Connection and Isolation Panel [NHANES]    Frequency of Communication with Friends and Family: Once a week    Frequency of Social Gatherings with Friends and Family: Once a week    Attends Religious Services: Never    Database administrator or  Organizations: No    Attends Banker Meetings: Never    Marital Status: Married    Review of Systems: A 12 point ROS discussed and pertinent positives are indicated in the HPI above.  All other systems are negative.  Review of Systems  Constitutional:  Negative for fever.  Respiratory:  Negative for cough and shortness of breath.   Cardiovascular:  Negative for chest pain.  Gastrointestinal:  Negative for abdominal pain, diarrhea, nausea and vomiting.    Vital Signs: BP 129/71 (BP Location: Right Arm)   Pulse 82   Temp 98.7 F (37.1 C) (Oral)   Resp 20   Ht 6\' 1"  (1.854 m)   Wt 229 lb (103.9 kg)   SpO2 97%   BMI 30.21 kg/m   Advance Care Plan: The advanced care plan/surrogate decision maker was discussed at the time of visit and documented in the medical record.    Physical Exam HENT:     Head: Normocephalic.     Mouth/Throat:     Mouth: Mucous membranes are moist.     Pharynx: Oropharynx is clear.  Cardiovascular:     Rate and  Rhythm: Normal rate and regular rhythm.  Pulmonary:     Effort: Pulmonary effort is normal.     Breath sounds: Wheezing and rhonchi present.  Skin:    General: Skin is warm.  Neurological:     Mental Status: He is alert and oriented to person, place, and time.  Psychiatric:        Mood and Affect: Mood normal.        Thought Content: Thought content normal.     Imaging: CT CHEST WO CONTRAST Result Date: 10/01/2023 CLINICAL DATA:  Significant increase in chest wall emphysema after right middle lobectomy. Questionable pneumothorax by chest x-ray. Assessment by CT to determine if a pigtail thoracostomy tube can be placed to help with air leak. EXAM: CT CHEST WITHOUT CONTRAST TECHNIQUE: Multidetector CT imaging of the chest was performed following the standard protocol without IV contrast. RADIATION DOSE REDUCTION: This exam was performed according to the departmental dose-optimization program which includes automated exposure control, adjustment of the mA and/or kV according to patient size and/or use of iterative reconstruction technique. COMPARISON:  Chest x-ray earlier today. FINDINGS: Cardiovascular: Normal heart size. Atherosclerosis of the thoracic aorta without aneurysm. No pericardial effusion. Extensive calcified coronary artery plaque. Dilated central pulmonary arteries with the main pulmonary artery measuring up to approximately 3.8 cm. Mediastinum/Nodes: Pneumomediastinum present. No mediastinal shift or enlarged lymph nodes. Lungs/Pleura: Small anterior component of pneumothorax and slightly larger basilar component. Overall pneumothorax is fairly small at approximately 15-20%. There likely is an air-leak decompressing into the chest wall, however, given a large amount of subcutaneous emphysema throughout the chest wall and crossing the midline into the left chest. Underlying aerated lungs demonstrate no consolidation, edema or significant pleural fluid. There is atelectasis at the right  lung base. Stable posterior left lower lobe lung nodule measuring approximately 12 mm. Upper Abdomen: No acute abnormality. Musculoskeletal: No chest wall mass or suspicious bone lesions identified. IMPRESSION: 1. Small anterior component of pneumothorax and slightly larger basilar component. Overall pneumothorax is fairly small at approximately 15-20%. There likely is an air-leak decompressing into the chest wall, however, given a large amount of subcutaneous emphysema throughout the chest wall and crossing the midline into the left chest. 2. Pneumomediastinum. 3. Dilated central pulmonary arteries with the main pulmonary artery measuring up to approximately 3.8 cm. This is consistent with  underlying pulmonary hypertension. 4. Extensive calcified coronary artery plaque. 5. Stable posterior left lower lobe lung nodule measuring approximately 12 mm. 6. Aortic atherosclerosis. Aortic Atherosclerosis (ICD10-I70.0). Electronically Signed   By: Erica Hau M.D.   On: 10/01/2023 15:00   DG CHEST PORT 1 VIEW Result Date: 10/01/2023 CLINICAL DATA:  Follow up pneumothorax. EXAM: PORTABLE CHEST 1 VIEW COMPARISON:  09/30/2023. FINDINGS: Stable small right-sided pneumothorax predominantly at the base. Subcutaneous emphysema overlies the right hemithorax and bilateral neck. Bibasilar mild parenchymal consolidation or volume loss. No pleural effusion. IMPRESSION: Stable small right-sided pneumothorax. Bibasilar atelectasis or consolidation. Subcutaneous emphysema. Electronically Signed   By: Sydell Eva M.D.   On: 10/01/2023 10:15   DG CHEST PORT 1 VIEW Result Date: 09/30/2023 CLINICAL DATA:  Pneumothorax EXAM: PORTABLE CHEST 1 VIEW COMPARISON:  X-ray 09/29/2023. FINDINGS: Interval removal of the right-sided chest tube. There is a right-sided small pneumothorax identified. Areas of air are seen the apex as well as at the lateral right lung base and medially along the right hemithorax. Increasing chest wall gas as  well. Hyperinflation. Lung base patchy opacities are again seen. Normal cardiopericardial silhouette with calcified aorta. No consolidation, edema. Slight prominence of central vasculature. No left-sided pneumothorax. Overlapping cardiac leads Critical Value/emergent results were called by telephone at the time of interpretation on 09/30/2023 at 10:24 am to provider [facility] , who verbally acknowledged these results. IMPRESSION: Interval removal of right chest tube with a developing right-sided pneumothorax. Electronically Signed   By: Adrianna Horde M.D.   On: 09/30/2023 10:37   DG Chest 1V REPEAT Same Day Result Date: 09/29/2023 CLINICAL DATA:  Pneumothorax. EXAM: CHEST - 1 VIEW SAME DAY COMPARISON:  Radiograph earlier today FINDINGS: Right-sided chest tube directed towards the apex. Extensive subcutaneous emphysema in the right supraclavicular soft tissues and chest wall, but no discrete pleural line or pneumothorax. Probable scarring at the left lung base. Stable heart size and mediastinal contours. No significant pleural effusion. IMPRESSION: Right-sided chest tube with extensive subcutaneous emphysema. No visualized pneumothorax. Electronically Signed   By: Chadwick Colonel M.D.   On: 09/29/2023 16:01   DG CHEST PORT 1 VIEW Result Date: 09/29/2023 CLINICAL DATA:  [id number] Pneumothorax on right [id number], recent lobectomy EXAM: PORTABLE CHEST - 1 VIEW COMPARISON:  None available. FINDINGS: Similarly positioned right-sided thoracostomy tube terminating in the upper right lung zone. Small right pleural effusion with right basilar atelectasis. No pneumothorax. Minimal left basilar atelectasis. No cardiomegaly. Extensive subcutaneous emphysema, unchanged. IMPRESSION: Similarly positioned right thoracostomy tube without visualized pneumothorax. Electronically Signed   By: Rance Burrows M.D.   On: 09/29/2023 10:25   DG CHEST PORT 1 VIEW Result Date: 09/28/2023 CLINICAL DATA:  Follow-up pneumothorax. EXAM:  PORTABLE CHEST 1 VIEW COMPARISON:  09/27/2023. FINDINGS: Right-sided chest tube is unchanged. Similar minimal right apical pneumothorax. Extensive subcutaneous emphysema is again noted along the right lateral chest wall. Stable cardiomediastinal silhouette. Aortic atherosclerosis. Left lung appears clear. IMPRESSION: 1. Stable right-sided chest tube with minimal right apical pneumothorax, not significantly changed. 2. Similar extensive subcutaneous emphysema along the right chest wall. Electronically Signed   By: Mannie Seek M.D.   On: 09/28/2023 12:07   DG CHEST PORT 1 VIEW Result Date: 09/27/2023 CLINICAL DATA:  Postop check EXAM: PORTABLE CHEST 1 VIEW COMPARISON:  Chest x-ray performed Sep 26, 2023 FINDINGS: Right-sided thoracostomy tube. Subcutaneous emphysema overlies the right chest wall, similar. A small right pneumothorax is annotated. IMPRESSION: 1. Post treatment changes in the right chest with small right  apical pneumothorax. 2. Moderate subcutaneous emphysema. Electronically Signed   By: Reagan Camera M.D.   On: 09/27/2023 08:47   DG Chest Port 1 View Result Date: 09/26/2023 CLINICAL DATA:  Status post lobectomy. EXAM: PORTABLE CHEST 1 VIEW COMPARISON:  Sep 25, 2023. FINDINGS: Stable cardiomediastinal silhouette. Right-sided chest tube is unchanged. Minimal right apical pneumothorax is noted. Increased subcutaneous emphysema is seen over right lateral chest wall. Left lung is clear. IMPRESSION: Stable right-sided chest tube with minimal right apical pneumothorax. Increased right-sided subcutaneous emphysema. Electronically Signed   By: Rosalene Colon M.D.   On: 09/26/2023 09:52   DG Chest Port 1 View Result Date: 09/25/2023 CLINICAL DATA:  Status post right lobectomy EXAM: PORTABLE CHEST 1 VIEW COMPARISON:  09/21/2023 FINDINGS: Cardiac shadow is stable. Postsurgical changes are noted on the right consistent with right middle lobectomy. Chest tube is noted in place. No pneumothorax is seen. Left  lung remains clear. The known left lower lobe nodule is not well appreciated on this exam. IMPRESSION: Status post right middle lobectomy.  No pneumothorax is noted. Electronically Signed   By: Violeta Grey M.D.   On: 09/25/2023 20:13   DG Chest 2 View Result Date: 09/25/2023 CLINICAL DATA:  Preop for right lung surgery. EXAM: CHEST - 2 VIEW COMPARISON:  Chest radiograph, 08/08/2023.  Chest CT, 07/25/2023. FINDINGS: Cardiac silhouette is normal in size and configuration. No mediastinal or hilar masses. No evidence of adenopathy. Lungs are hyperexpanded. Focal opacity in the lateral right middle lobe corresponds to the nodules noted in this location on the prior CT. There are linear opacities at the left lateral lung base associated with blunting of the lateral left costophrenic sulcus consistent with scarring, also stable from prior exam. Faint nodular opacity projects in left lower lung corresponding to the left lower lobe nodule noted on the prior CT. No pleural effusion or pneumothorax. Skeletal structures are intact.  Remainder of the lungs is clear. IMPRESSION: 1. No acute cardiopulmonary disease. 2. Lung nodules as detailed stable from the prior chest CT. 3. COPD. Electronically Signed   By: Amanda Jungling M.D.   On: 09/25/2023 12:13   Myocardial Perfusion Imaging Result Date: 09/22/2023   The study is normal. The study is low risk.   No ST deviation was noted.   LV perfusion is normal. There is no evidence of ischemia. There is no evidence of infarction.   Left ventricular function is normal. Nuclear stress EF: 67%. The left ventricular ejection fraction is hyperdynamic (>65%). End diastolic cavity size is normal. End systolic cavity size is normal. No evidence of transient ischemic dilation (TID) noted.   Coronary calcium  was present on the attenuation correction CT images. Severe coronary calcifications were present. Coronary calcifications were present in the left anterior descending artery, left  circumflex artery and right coronary artery distribution(s).   Prior study not available for comparison.    Labs:  CBC: Recent Labs    09/21/23 1430 09/26/23 0254 09/27/23 0245 10/02/23 0026  WBC 4.7 8.9 8.5 7.6  HGB 15.3 13.3 13.6 13.1  HCT 45.2 40.1 41.2 38.6*  PLT 224 216 177 237    COAGS: Recent Labs    09/21/23 1430 10/02/23 0026  INR 1.0 1.1  APTT 29  --     BMP: Recent Labs    08/22/23 1327 09/21/23 1430 09/26/23 0254 09/27/23 0245  NA 137 136 136 139  K 4.5 3.8 4.3 4.3  CL 103 103 105 106  CO2 26 23 24  25  GLUCOSE 107* 121* 128* 106*  BUN 11 10 14 12   CALCIUM  9.5 9.4 8.7* 8.5*  CREATININE 1.21 1.10 1.16 1.02  GFRNONAA >60 >60 >60 >60    LIVER FUNCTION TESTS: Recent Labs    08/08/23 0745 08/22/23 1327 09/21/23 1430 09/27/23 0245  BILITOT 0.8 0.6 0.8 0.8  AST 31 37 28 33  ALT 20 20 21 14   ALKPHOS 47 55 58 44  PROT 7.1 7.8 7.8 6.8  ALBUMIN  3.8 4.4 3.9 3.5    TUMOR MARKERS: No results for input(s): "AFPTM", "CEA", "CA199", "CHROMGRNA" in the last 8760 hours.  Assessment and Plan: Pneumothorax   Patient is a 73 y/o male with history of  AAA, bladder cancer, COPD, cirrhosis, DVT, hepatitis C, HLD, HTN, PE, and lung nodule-s/p right middle lobectomy 09/25/23. Patient developed a right pneumothorax s/p chest tube removal 09/29/23. IR consulted to assist with a new right chest tube placement. Patient is NPO. Denies chest pain, shortness of breath, nausea, vomiting, abdominal pain, and/or fever.  Risks and benefits of chest tube placement were discussed with the patient including bleeding, infection, damage to adjacent structures, malfunction of the tube requiring additional procedures and sepsis.  All of the patient's questions were answered, patient is agreeable to proceed. Consent signed and in chart.  Thank you for this interesting consult.  I greatly enjoyed meeting Wyatt Berry and look forward to participating in their care.  A copy of this  report was sent to the requesting provider on this date.  Electronically Signed: Lawrance Presume, PA 10/02/2023, 10:20 AM   I spent a total of 20 Minutes in face to face in clinical consultation, greater than 50% of which was counseling/coordinating care for pneumothorax

## 2023-10-02 NOTE — Progress Notes (Signed)
 7 Days Post-Op Procedure(s) (LRB): LOBECTOMY, LUNG, ROBOT-ASSISTED, USING VATS (Right) BLOCK, NERVE, INTERCOSTAL (Right) Subjective:  Resting in bed, feels OK and says he is breathing about the same.  On O2 2 L, oxygen saturation about 90%.   Objective: Vital signs in last 24 hours: Temp:  [98.5 F (36.9 C)-98.7 F (37.1 C)] 98.6 F (37 C) (05/12 0545) Pulse Rate:  [84-114] 89 (05/12 0545) Cardiac Rhythm: Normal sinus rhythm (05/11 1900) Resp:  [17-20] 17 (05/12 0545) BP: (131-156)/(69-98) 131/69 (05/12 0545) SpO2:  [91 %-96 %] 96 % (05/12 0545)   Intake/Output from previous day: 05/11 0701 - 05/12 0700 In: 240 [P.O.:240] Out: 300 [Urine:300] Intake/Output this shift: No intake/output data recorded.  General appearance: alert, cooperative, and no distress. Has mild dysphonia today.  Neurologic: intact Heart: RRR, NSR on monitor with few PAC's Lungs: normal work of breathing at rest,  Has persistent right-side subQ air extending into neck.  CT scan done yesterday afternoon confirms 15 to 20% right pneumothorax.   Abdomen: soft, no tenderness Wound:  the CT insertion site is covered with a dry dressing this morning.  Port sites are dry and intact.  Lab Results: Recent Labs    10/02/23 0026  WBC 7.6  HGB 13.1  HCT 38.6*  PLT 237    BMET:  No results for input(s): "NA", "K", "CL", "CO2", "GLUCOSE", "BUN", "CREATININE", "CALCIUM " in the last 72 hours.   PT/INR:  Recent Labs    10/02/23 0026  LABPROT 14.0  INR 1.1   ABG No results found for: "PHART", "HCO3", "TCO2", "ACIDBASEDEF", "O2SAT" CBG (last 3)  No results for input(s): "GLUCAP" in the last 72 hours.   Assessment/Plan: S/P Procedure(s) (LRB): LOBECTOMY, LUNG, ROBOT-ASSISTED, USING VATS (Right) BLOCK, NERVE, INTERCOSTAL (Right)  -POD7 right middle lobectomy.  Chest tube removed 5/9 after a successful clamp trial.  But he subsequently developed small right pneumothorax with subcu air.  The chest x-ray  did not show a clear window for insertion of a pleural tube at the bedside.  We have consulted interventional radiology for image guided chest tube placement.  Procedure planned for yesterday but patient inadvertently received his lunch after n.p.o. order was placed.  Pleural tube placement by IR planned for this morning.  -History of HTN- blood pressure control reasonable.  On his usual dosing of amlodipine , Toprol , and losartan .    -Tobacco dependence- working on smoking cessation. Resumed Chantix .  -H/O BPH- resume Flomax . Voiding OK.  -DVT PPX- enoxaparin .    LOS: 7 days    Ghada Abbett G. Krist Rosenboom, PA-C 10/02/2023

## 2023-10-02 NOTE — Progress Notes (Signed)
 Mobility Specialist Progress Note;   10/02/23 0915  Mobility  Activity Ambulated with assistance in hallway  Level of Assistance Standby assist, set-up cues, supervision of patient - no hands on  Assistive Device Front wheel walker  Distance Ambulated (ft) 400 ft  Activity Response Tolerated well  Mobility Referral Yes  Mobility visit 1 Mobility  Mobility Specialist Start Time (ACUTE ONLY) 0915  Mobility Specialist Stop Time (ACUTE ONLY) [id number]  Mobility Specialist Time Calculation (min) (ACUTE ONLY) 13 min   Pt eager for mobility. On 2LO2 upon arrival. Required no physical assistance during ambulation, SV. Attempted ambulation on RA, however required 2LO2 to keep SPO2 WFL. No c/o when asked. Pt left in chair with all needs met. Wife in room.   Wyatt Berry Mobility Specialist Please contact via SecureChat or [employer] [phone]

## 2023-10-02 NOTE — Plan of Care (Signed)

## 2023-10-02 NOTE — Plan of Care (Signed)

## 2023-10-03 ENCOUNTER — Inpatient Hospital Stay (HOSPITAL_COMMUNITY)

## 2023-10-03 ENCOUNTER — Other Ambulatory Visit (HOSPITAL_COMMUNITY)

## 2023-10-03 NOTE — Plan of Care (Signed)

## 2023-10-03 NOTE — Progress Notes (Signed)
 Mobility Specialist Progress Note;   10/03/23 0930  Mobility  Activity Ambulated with assistance in hallway  Level of Assistance Standby assist, set-up cues, supervision of patient - no hands on  Assistive Device Front wheel walker  Distance Ambulated (ft) 400 ft  Activity Response Tolerated well  Mobility Referral Yes  Mobility visit 1 Mobility  Mobility Specialist Start Time (ACUTE ONLY) 0930  Mobility Specialist Stop Time (ACUTE ONLY) 0942  Mobility Specialist Time Calculation (min) (ACUTE ONLY) 12 min   Pt eager for mobility. Required no physical assistance during ambulation, SV. Ambulated on RA, VSS throughout. C/o some soreness in chest tube site. Requested to sit in chair at [facility]. Pt left in chair with all needs met, call bell in reach.   Janit Meline Mobility Specialist Please contact via SecureChat or [employer] [phone]

## 2023-10-03 NOTE — Progress Notes (Signed)
 8 Days Post-Op Procedure(s) (LRB): LOBECTOMY, LUNG, ROBOT-ASSISTED, USING VATS (Right) BLOCK, NERVE, INTERCOSTAL (Right) Subjective:  Sitting up, says he was breathing easier as soon as the new chest tube was placed yesterday. Feels good this morning. No new problems.   Objective: Vital signs in last 24 hours: Temp:  [98.7 F (37.1 C)-98.8 F (37.1 C)] 98.8 F (37.1 C) (05/13 0500) Pulse Rate:  [82-95] 82 (05/13 0500) Cardiac Rhythm: Normal sinus rhythm (05/12 2018) Resp:  [16-21] 18 (05/13 0500) BP: (129-145)/(59-78) 137/65 (05/13 0500) SpO2:  [90 %-98 %] 90 % (05/13 0500)   Intake/Output from previous day: 05/12 0701 - 05/13 0700 In: 480 [P.O.:480] Out: 1248 [Urine:1200; Chest Tube:48] Intake/Output this shift: No intake/output data recorded.  General appearance: alert, cooperative, and no distress. Has mild dysphonia  seems to e improving today.  Neurologic: intact Heart: RRR, NSR on monitor with few PAC's Lungs: normal work of breathing at rest on RA.  Has decreasing right-side subQ air.   The pigtail catheter is secured and is to suction, no air leak. CXR shows the subQ air slightly improved. I do not see a PTX but difficult to read with soft tissue air.  Abdomen: soft, no tenderness Wound:  Port sites are dry and intact.  Lab Results: Recent Labs    10/02/23 0026  WBC 7.6  HGB 13.1  HCT 38.6*  PLT 237    BMET:  No results for input(s): "NA", "K", "CL", "CO2", "GLUCOSE", "BUN", "CREATININE", "CALCIUM " in the last 72 hours.   PT/INR:  Recent Labs    10/02/23 0026  LABPROT 14.0  INR 1.1   ABG No results found for: "PHART", "HCO3", "TCO2", "ACIDBASEDEF", "O2SAT" CBG (last 3)  No results for input(s): "GLUCAP" in the last 72 hours.   Assessment/Plan: S/P Procedure(s) (LRB): LOBECTOMY, LUNG, ROBOT-ASSISTED, USING VATS (Right) BLOCK, NERVE, INTERCOSTAL (Right)  -POD8 right middle lobectomy.  Chest tube removed 5/9 after a successful clamp trial.  But he  subsequently developed small right pneumothorax with subcu air.  Pleural tube placement by IR yesterday with improvement is subQ air. There is no air leak.   -History of HTN- blood pressure control reasonable.  On his usual dosing of amlodipine , Toprol , and losartan .    -Tobacco dependence- working on smoking cessation. Resumed Chantix .  -H/O BPH- resume Flomax . Voiding OK.  -DVT PPX- enoxaparin .   -Will place the chest tub to water  seal this morning. I offered to possibly discharge Mr. Bucholz to home later today with the chest tube connected to a [employer] but he said he would rather stay here until the tube is out.    LOS: 8 days    Kaylina Cahue G. Azariel Banik, PA-C 10/03/2023

## 2023-10-04 ENCOUNTER — Inpatient Hospital Stay (HOSPITAL_COMMUNITY)

## 2023-10-04 MED ORDER — ACETAMINOPHEN 325 MG PO TABS: 325.0000 mg | ORAL_TABLET | Freq: Four times a day (QID) | ORAL | Status: DC | PRN

## 2023-10-04 NOTE — Plan of Care (Signed)

## 2023-10-04 NOTE — Progress Notes (Addendum)
      [address]       [place] [address]             [phone]      9 Days Post-Op Procedure(s) (LRB): LOBECTOMY, LUNG, ROBOT-ASSISTED, USING VATS (Right) BLOCK, NERVE, INTERCOSTAL (Right)  Subjective:  Patient sitting up on side of bed w/o complaints.  Feels like sub q air is improving.  Overall doing well.  Pain is well controlled.  He has moved his bowels.    Objective: Vital signs in last 24 hours: Temp:  [98.3 F (36.8 C)-98.9 F (37.2 C)] 98.9 F (37.2 C) (05/14 0722) Pulse Rate:  [81-99] 99 (05/14 0722) Cardiac Rhythm: Normal sinus rhythm (05/13 2000) Resp:  [15-19] 15 (05/14 0722) BP: (116-141)/(60-74) 131/74 (05/14 0722) SpO2:  [92 %-95 %] 92 % (05/14 0722)  Intake/Output from previous day: 05/13 0701 - 05/14 0700 In: 120 [P.O.:120] Out: 1050 [Urine:1000; Chest Tube:50] Intake/Output this shift: Total I/O In: 480 [P.O.:480] Out: 200 [Urine:200]  General appearance: alert, cooperative, and no distress Heart: regular rate and rhythm Lungs: clear to auscultation bilaterally Abdomen: soft, non-tender; bowel sounds normal; no masses,  no organomegaly Extremities: extremities normal, atraumatic, no cyanosis or edema Wound: clean and dry, sub q emphysema along right chest  Lab Results: Recent Labs    10/02/23 0026  WBC 7.6  HGB 13.1  HCT 38.6*  PLT 237   BMET: No results for input(s): "NA", "K", "CL", "CO2", "GLUCOSE", "BUN", "CREATININE", "CALCIUM " in the last 72 hours.  PT/INR:  Recent Labs    10/02/23 0026  LABPROT 14.0  INR 1.1   ABG No results found for: "PHART", "HCO3", "TCO2", "ACIDBASEDEF", "O2SAT" CBG (last 3)  No results for input(s): "GLUCAP" in the last 72 hours.  Assessment/Plan: S/P Procedure(s) (LRB): LOBECTOMY, LUNG, ROBOT-ASSISTED, USING VATS (Right) BLOCK, NERVE, INTERCOSTAL (Right)  CV-NSR, H/O HTN- BP is well controlled- continue Toprol  XL, Norvasc , Cozaar  Pulm- CT on suction without airleak, CXR w/o  significant pneumothorax, sub q emphysema is stable.. will clamp chest tube for 24 hrs, repeat CXR in AM Nicotine Dependence- patient on Chantix  H/O BPH- on Flomax  Dispo- patient stable, will discuss plan with Dr. Lightfoot Lovenox  for DVT prophylaxis   LOS: 9 days   Wyatt Kasal, PA-C 10/04/2023

## 2023-10-04 NOTE — Progress Notes (Signed)
 Mobility Specialist Progress Note;   10/04/23 0917  Mobility  Activity Ambulated with assistance in hallway  Level of Assistance Standby assist, set-up cues, supervision of patient - no hands on  Assistive Device Front wheel walker  Distance Ambulated (ft) 400 ft  Activity Response Tolerated well  Mobility Referral Yes  Mobility visit 1 Mobility  Mobility Specialist Start Time (ACUTE ONLY) [id number]  Mobility Specialist Stop Time (ACUTE ONLY) 0927  Mobility Specialist Time Calculation (min) (ACUTE ONLY) 10 min   Pt eagre for mobility. Required no physical assistance during ambulation, SV. Ambulated on RA, VSS throughout. HR in 120s during ambulation. No c/o when asked. Pt requested to sit in chair at [facility]. Pt left in chair with all needs met, call bell in reach.   Janit Meline Mobility Specialist Please contact via SecureChat or [employer] [phone]

## 2023-10-05 ENCOUNTER — Inpatient Hospital Stay (HOSPITAL_COMMUNITY)

## 2023-10-05 ENCOUNTER — Other Ambulatory Visit: Payer: Self-pay

## 2023-10-05 MED ORDER — OXYCODONE HCL 5 MG PO TABS: 5.0000 mg | ORAL_TABLET | ORAL | 0 refills | Status: AC | PRN

## 2023-10-05 MED ORDER — ACETAMINOPHEN 325 MG PO TABS: 650.0000 mg | ORAL_TABLET | ORAL | Status: AC | PRN

## 2023-10-05 NOTE — Progress Notes (Signed)
 Referring Physician(s): Dr. Starleen Eastern   Supervising Physician: Erica Hau  Patient Status:  [facility] - In-pt  Chief Complaint: Right pneumothorax s/p chest tube placement 5/12  Subjective: Patient sitting up in chair. Chset tube in place.  Minimal pleural fluid in Vacutainer.  Chest tube has been to seal >24 hrs with no residual PTX.  Removal requested for d/c home today.   Allergies: Patient has no known allergies.  Medications: Prior to Admission medications   Medication Sig Start Date End Date Taking? Authorizing Provider  acetaminophen  (TYLENOL ) 325 MG tablet Take 2 tablets (650 mg total) by mouth every 4 (four) hours as needed for moderate pain (pain score 4-6). 10/05/23 10/04/24 Yes Roddenberry, Myron G, PA-C  amLODipine  (NORVASC ) 5 MG tablet Take 5 mg by mouth daily.   Yes [provider]  aspirin  EC 81 MG tablet Take 81 mg by mouth daily.   Yes [provider]  atorvastatin  (LIPITOR) 10 MG tablet Take 10 mg by mouth daily. 01/16/22  Yes [provider]  losartan -hydrochlorothiazide  (HYZAAR) 100-12.5 MG per tablet Take 1 tablet by mouth daily.   Yes [provider]  metoprolol  succinate (TOPROL -XL) 25 MG 24 hr tablet Take 25 mg by mouth at bedtime. 01/27/22  Yes [provider]  Multiple Vitamin (MULTIVITAMIN) tablet Take 1 tablet by mouth daily.   Yes [provider]  tamsulosin  (FLOMAX ) 0.4 MG CAPS capsule Take 0.4 mg by mouth at bedtime.   Yes [provider]  varenicline  (CHANTIX ) 1 MG tablet Take 1 mg by mouth 2 (two) times daily. 07/06/23  Yes [provider]  oxyCODONE  (OXY IR/ROXICODONE ) 5 MG immediate release tablet Take 1 tablet (5 mg total) by mouth every 4 (four) hours as needed for up to 5 days for severe pain (pain score 7-10). 10/05/23 10/10/23  Leata Providence, PA-C     Vital Signs: BP 133/82 (BP Location: Right Arm)   Pulse 96   Temp 98.3 F (36.8 C) (Oral)   Resp 19    Ht 6\' 1"  (1.854 m)   Wt 229 lb (103.9 kg)   SpO2 94%   BMI 30.21 kg/m   Physical Exam  Imaging: DG CHEST PORT 1 VIEW Result Date: 10/05/2023 CLINICAL DATA:  Right pneumothorax EXAM: PORTABLE CHEST 1 VIEW COMPARISON:  Oct 04, 2023 FINDINGS: Right pleural catheter along the right costophrenic sulcus. No residual pneumothorax. Suggestion of small pneumomediastinum along the right hilar region. Significant subcutaneous emphysema Right pleural reaction basilar hypoventilatory atelectasis Heart normal size IMPRESSION: Right pleural catheter along the right costophrenic sulcus. No residual pneumothorax. Suggestion of small pneumomediastinum along the right hilar region. Electronically Signed   By: Fredrich Jefferson M.D.   On: 10/05/2023 08:15   DG CHEST PORT 1 VIEW Result Date: 10/04/2023 CLINICAL DATA:  Pneumothorax.  Right-sided chest tube. EXAM: PORTABLE CHEST 1 VIEW COMPARISON:  Oct 03, 2023. FINDINGS: Continued presence of right basilar chest tube. Stable extensive bilateral subcutaneous emphysema is noted. Due to subcutaneous emphysema, it is difficult to evaluate for underlying pneumothorax. Stable cardiomediastinal silhouette. IMPRESSION: Stable right-sided chest tube. Stable extensive bilateral subcutaneous emphysema. It is difficult to evaluate for pneumothorax given the extensive subcutaneous emphysema. Electronically Signed   By: Rosalene Colon M.D.   On: 10/04/2023 10:07   DG CHEST PORT 1 VIEW Result Date: 10/03/2023 CLINICAL DATA:  Pneumothorax EXAM: PORTABLE CHEST 1 VIEW COMPARISON:  Oct 01, 2023 FINDINGS: Right pleural catheter in the right lung base with interval resolution of  the right basilar pneumothorax significant persistent subcutaneous emphysema. Minimal basilar atelectasis without consolidations Heart and mediastinum normal IMPRESSION: Right pleural catheter in the right lung base with interval resolution of the right basilar pneumothorax. Electronically Signed   By: Fredrich Jefferson M.D.    On: 10/03/2023 08:51   IR PERC PLEURAL DRAIN W/INDWELL CATH W/IMG GUIDE Result Date: 10/02/2023 INDICATION: Status post right middle lobectomy, recurrent pneumothorax, extensive chest wall subcutaneous emphysema EXAM: FLUOROSCOPIC GUIDED 12 FRENCH RIGHT BASILAR CHEST TUBE MEDICATIONS: 1% LIDOCAINE  LOCAL ANESTHESIA/SEDATION: Moderate (conscious) sedation was employed during this procedure. A total of Versed  2.0 mg and Fentanyl  100 mcg was administered intravenously by the radiology nurse. Total intra-service moderate Sedation Time: 11 minutes. The patient's level of consciousness and vital signs were monitored continuously by radiology nursing throughout the procedure under my direct supervision. COMPLICATIONS: None immediate. PROCEDURE: Informed written consent was obtained from the patient after a thorough discussion of the procedural risks, benefits and alternatives. All questions were addressed. Maximal Sterile Barrier Technique was utilized including caps, mask, sterile gowns, sterile gloves, sterile drape, hand hygiene and skin antiseptic. A timeout was performed prior to the initiation of the procedure. previous imaging reviewed. patient positioned supine. A lower intercostal space was localized and marked in the mid axillary line for access. under fluoroscopy, the 18 gauge access needle was advanced into the pleural space. needle position confirmed with fluoroscopy. syringe aspiration yielded air. guidewire advanced easily. tract dilatation performed to insert a 12 french drain. drain position confirmed with fluoroscopy. Catheter secured with a silk suture and a sterile dressing. External pleura vac connected. IMPRESSION: Successful fluoroscopic 12 French right basilar chest tube insertion Electronically Signed   By: Melven Stable.  Shick M.D.   On: 10/02/2023 12:18    Labs:  CBC: Recent Labs    09/21/23 1430 09/26/23 0254 09/27/23 0245 10/02/23 0026  WBC 4.7 8.9 8.5 7.6  HGB 15.3 13.3 13.6 13.1  HCT  45.2 40.1 41.2 38.6*  PLT 224 216 177 237    COAGS: Recent Labs    09/21/23 1430 10/02/23 0026  INR 1.0 1.1  APTT 29  --     BMP: Recent Labs    08/22/23 1327 09/21/23 1430 09/26/23 0254 09/27/23 0245  NA 137 136 136 139  K 4.5 3.8 4.3 4.3  CL 103 103 105 106  CO2 26 23 24 25   GLUCOSE 107* 121* 128* 106*  BUN 11 10 14 12   CALCIUM  9.5 9.4 8.7* 8.5*  CREATININE 1.21 1.10 1.16 1.02  GFRNONAA >60 >60 >60 >60    LIVER FUNCTION TESTS: Recent Labs    08/08/23 0745 08/22/23 1327 09/21/23 1430 09/27/23 0245  BILITOT 0.8 0.6 0.8 0.8  AST 31 37 28 33  ALT 20 20 21 14   ALKPHOS 47 55 58 44  PROT 7.1 7.8 7.8 6.8  ALBUMIN  3.8 4.4 3.9 3.5    Assessment and Plan: R pneumothorax s/p R VATS with middle lobectomy in the setting of lung cancer. Patient s/p chest tube placement 5/12 by Dr. Lovell Rubenstein.  Patient followed closely by [facility].  Tube has been to seal for >24 hrs with no residual PTX or recurrent symptoms.  Patient with only slight intermittent cough.  Request for removal for pending discharge home today.   PA to bedside.  Tube removed without complication. Dressing placed. Care instructions provided to patient.   Portable CXR ordered.   Electronically Signed: Leronda Lewers Sue-Ellen Damarcus Reggio, PA 10/05/2023, 2:01 PM   I spent a total of 15 Minutes  at the the patient's bedside AND on the patient's hospital floor or unit, greater than 50% of which was counseling/coordinating care for right pneumothorax.

## 2023-10-05 NOTE — Progress Notes (Signed)
 Mobility Specialist Progress Note;   10/05/23 0940  Mobility  Activity Ambulated with assistance in hallway;Transferred from bed to chair  Level of Assistance Standby assist, set-up cues, supervision of patient - no hands on  Assistive Device Front wheel walker  Distance Ambulated (ft) 400 ft  Activity Response Tolerated well  Mobility Referral Yes  Mobility visit 1 Mobility  Mobility Specialist Start Time (ACUTE ONLY) 0940  Mobility Specialist Stop Time (ACUTE ONLY) 0950  Mobility Specialist Time Calculation (min) (ACUTE ONLY) 10 min   Pt eager for mobility. Required no physical assistance during ambulation, SV. Ambulated on RA, VSS throughout. HR in 120s w/ activity. Pt left sitting in chair with all needs met, call bell in reach.   Janit Meline Mobility Specialist Please contact via SecureChat or [employer] [phone]

## 2023-10-05 NOTE — Plan of Care (Signed)

## 2023-10-05 NOTE — Progress Notes (Addendum)
 10 Days Post-Op Procedure(s) (LRB): LOBECTOMY, LUNG, ROBOT-ASSISTED, USING VATS (Right) BLOCK, NERVE, INTERCOSTAL (Right) Subjective:  Up in the bedside chair, feels better overall. Voice is back to normal. No dyspnea on RA.   Objective: Vital signs in last 24 hours: Temp:  [98.4 F (36.9 C)-98.6 F (37 C)] 98.5 F (36.9 C) (05/15 0731) Pulse Rate:  [82-104] 104 (05/15 0731) Cardiac Rhythm: Sinus bradycardia (05/15 0700) Resp:  [15-18] 16 (05/15 0731) BP: (125-147)/(63-78) 141/69 (05/15 0731) SpO2:  [93 %-95 %] 95 % (05/15 0731)   Intake/Output from previous day: 05/14 0701 - 05/15 0700 In: 1680 [P.O.:1680] Out: 1950 [Urine:1950] Intake/Output this shift: Total I/O In: 360 [P.O.:360] Out: -   General appearance: alert, cooperative, and no distress.  Dysphonia resolved.  Neurologic: intact Heart: RRR, NSR on monitor with few PAC's Lungs: normal work of breathing. SubQ air improving. Chest tube has been clamped past 24 hours. No air leak when the clamp was removed this morning. CXR showing no PTX this morning.   Abdomen: soft, no tenderness Wound:  Port sites are dry and intact.  CLINICAL DATA:  Right pneumothorax   EXAM: PORTABLE CHEST 1 VIEW   COMPARISON:  Oct 04, 2023   FINDINGS: Right pleural catheter along the right costophrenic sulcus. No residual pneumothorax. Suggestion of small pneumomediastinum along the right hilar region. Significant subcutaneous emphysema   Right pleural reaction basilar hypoventilatory atelectasis   Heart normal size   IMPRESSION: Right pleural catheter along the right costophrenic sulcus. No residual pneumothorax. Suggestion of small pneumomediastinum along the right hilar region.     Electronically Signed   By: Fredrich Jefferson M.D.   On: 10/05/2023 08:15     Assessment/Plan: S/P Procedure(s) (LRB): LOBECTOMY, LUNG, ROBOT-ASSISTED, USING VATS (Right) BLOCK, NERVE, INTERCOSTAL (Right)  -POD10 right middle lobectomy.   Chest tube removed 5/9 after a successful clamp trial.  But he subsequently developed small right pneumothorax with subcu air.   Pleural tube replaced by IR on 5/12.  CT clamped past 24 hours with no PTX on AM CXR. Remains on RA with no dyspnea and subQ air is abating.  Plan to remove the pigtail catheter today.    -History of HTN- blood pressure control reasonable.  On his usual dosing of amlodipine , Toprol , and losartan .    -Tobacco dependence- working on smoking cessation. Resumed Chantix .  -H/O BPH- resume Flomax . Voiding OK.  -DVT PPX- enoxaparin .    LOS: 10 days    Leata Providence, PA-C 10/05/2023   Agree CT stable Will remove chest tube Home today  Shenelle Klas O Serria Sloma

## 2023-10-05 NOTE — Progress Notes (Signed)
 The proposed treatment discussed in conference is for discussion purpose only and is not a binding recommendation.  The patients have not been physically examined, or presented with their treatment options.  Therefore, final treatment plans cannot be decided.

## 2023-10-06 ENCOUNTER — Ambulatory Visit: Admitting: Thoracic Surgery (Cardiothoracic Vascular Surgery)

## 2023-10-09 NOTE — Progress Notes (Signed)
      [address]       [place] [address]             [phone]        VERSHAWN WESTRUP [facility] Medical Record #[id number] Date of Birth: May 11, 1951  Referring: [name], MD Primary Care: [name], MD Primary Cardiologist:None  Reason for visit:   follow-up  History of Present Illness:     Wyatt Berry presents for their first follow-up appointment.  Overall, he is doing well.    Physical Exam: There were no vitals taken for this visit.  Alert NAD Incision clean.   Abdomen, ND no peripheral edema   Diagnostic Studies & Laboratory data:  Path:  FINAL MICROSCOPIC DIAGNOSIS:  A. LUNG, RIGHT MIDDLE LOBE, LOBECTOMY: - Invasive papillary adenocarcinoma. - One intrapulmonary lymph node negative for malignancy (0/1). - See cancer summary below.   CASE SUMMARY: LUNG Standard(s): AJCC 9  SPECIMEN Synchronous Tumors: Not applicable Procedure: Lobectomy Specimen Laterality: Right  TUMOR Tumor Focality: Single focus Tumor Site: Middle lobe of lung Tumor Size      Invasive Tumor Size: 2.5 cm Histologic Type: Invasive papillary adenocarcinoma Histologic Patterns:      Papillary: 90%      Solid: 5%      Micropapillary: 5% Histologic Patterns: G2, moderately differentiated Spread Through Retail banker (STAS): Present Visceral Pleura Invasion: Present Direct Invasion of Adjacent Structures: Not applicable (no other structures present) Treatment Effect: No known presurgical therapy Lymphatic and / or Vascular Invasion: Not identified  MARGINS Margin Status for Invasive Tumor: All margins negative for invasive tumor      Closest margin to invasive tumor: Bronchial      Distance from invasive tumor to closest margin labeled: 2 cm  Margin Status for Non-Invasive Tumor: All margins negative for non-invasive tumor  REGIONAL LYMPH NODES Lymph Node(s) from Prior Procedures: No known prior lymph node sampling performed Regional Lymph  Node Status: All regional lymph nodes negative for tumor      Number of lymph nodes examined: 1  DISTANT METASTASIS Distant Site(s) Involved, if applicable: Not applicable  PATHOLOGIC STAGE CLASSIFICATION (pTNM, AJCC 9th Edition): Modified Classification: Not applicable pT2a      T Suffix: Not applicable pN0     Assessment / Plan:   73 y.o. male s/p RMLectomy for  T2aN0M0 adenocarcinoma.  I have made a referral to medical oncology, along with a 1 month follow-up with a CXR.     Hilarie Lovely 10/09/2023 1:42 PM

## 2023-10-13 ENCOUNTER — Encounter: Payer: Self-pay | Admitting: Thoracic Surgery (Cardiothoracic Vascular Surgery)

## 2023-10-13 ENCOUNTER — Ambulatory Visit
Attending: Thoracic Surgery (Cardiothoracic Vascular Surgery) | Admitting: Thoracic Surgery (Cardiothoracic Vascular Surgery)

## 2023-10-13 VITALS — BP 162/68 | HR 93 | Resp 20 | Ht 73.0 in | Wt 229.4 lb

## 2023-10-13 DIAGNOSIS — Z902 Acquired absence of lung [part of]: Secondary | ICD-10-CM

## 2023-11-06 ENCOUNTER — Inpatient Hospital Stay

## 2023-11-06 ENCOUNTER — Inpatient Hospital Stay: Attending: Physician Assistant | Admitting: Internal Medicine

## 2023-11-06 ENCOUNTER — Other Ambulatory Visit: Payer: Self-pay | Admitting: Medical Oncology

## 2023-11-06 VITALS — BP 152/82 | HR 79 | Temp 97.6°F | Resp 18 | Ht 73.0 in | Wt 227.0 lb

## 2023-11-06 DIAGNOSIS — C3490 Malignant neoplasm of unspecified part of unspecified bronchus or lung: Secondary | ICD-10-CM

## 2023-11-06 DIAGNOSIS — Z902 Acquired absence of lung [part of]: Secondary | ICD-10-CM | POA: Diagnosis not present

## 2023-11-06 DIAGNOSIS — C342 Malignant neoplasm of middle lobe, bronchus or lung: Secondary | ICD-10-CM | POA: Insufficient documentation

## 2023-11-06 LAB — CMP (CANCER CENTER ONLY)
ALT: 14 U/L (ref 0–44)
AST: 21 U/L (ref 15–41)
Albumin: 4.1 g/dL (ref 3.5–5.0)
Alkaline Phosphatase: 65 U/L (ref 38–126)
Anion gap: 7 (ref 5–15)
BUN: 11 mg/dL (ref 8–23)
CO2: 26 mmol/L (ref 22–32)
Calcium: 9.1 mg/dL (ref 8.9–10.3)
Chloride: 105 mmol/L (ref 98–111)
Creatinine: 1.1 mg/dL (ref 0.61–1.24)
GFR, Estimated: >60 mL/min (ref >60)
Glucose, Bld: 133 mg/dL — ABNORMAL HIGH (ref 70–99)
Potassium: 3.8 mmol/L (ref 3.5–5.1)
Sodium: 138 mmol/L (ref 135–145)
Total Bilirubin: 0.5 mg/dL (ref 0.0–1.2)
Total Protein: 7.6 g/dL (ref 6.5–8.1)

## 2023-11-06 LAB — CBC WITH DIFFERENTIAL (CANCER CENTER ONLY)
Abs Immature Granulocytes: 0.01 10*3/uL (ref 0.00–0.07)
Basophils Absolute: 0 10*3/uL (ref 0.0–0.1)
Basophils Relative: 0 %
Eosinophils Absolute: 0 10*3/uL (ref 0.0–0.5)
Eosinophils Relative: 1 %
HCT: 39.3 % (ref 39.0–52.0)
Hemoglobin: 13.4 g/dL (ref 13.0–17.0)
Immature Granulocytes: 0 %
Lymphocytes Relative: 43 %
Lymphs Abs: 2.2 10*3/uL (ref 0.7–4.0)
MCH: 29.6 pg (ref 26.0–34.0)
MCHC: 34.1 g/dL (ref 30.0–36.0)
MCV: 86.8 fL (ref 80.0–100.0)
Monocytes Absolute: 0.5 10*3/uL (ref 0.1–1.0)
Monocytes Relative: 10 %
Neutro Abs: 2.4 10*3/uL (ref 1.7–7.7)
Neutrophils Relative %: 46 %
Platelet Count: 203 10*3/uL (ref 150–400)
RBC: 4.53 MIL/uL (ref 4.22–5.81)
RDW: 14.4 % (ref 11.5–15.5)
WBC Count: 5.1 10*3/uL (ref 4.0–10.5)
nRBC: 0 % (ref 0.0–0.2)

## 2023-11-06 NOTE — Progress Notes (Signed)
 [facility] Telephone:([phone]) [phone]   Fax:([phone]) [phone]  OFFICE PROGRESS NOTE  [name], MD [address] [place] [address]  DIAGNOSIS: Stage IB (T2a, N0, M0) non-small cell lung cancer, adenocarcinoma diagnosed in March 2025 and presented with right middle lobe lung nodule  PRIOR THERAPY: Status post right middle lobectomy with mediastinal lymph node sampling under the care of Dr. Deloise Ferries on 09/25/2023.  The final pathology showed 2.5 cm adenocarcinoma with visceral pleural involvement.  CURRENT THERAPY: Observation.  INTERVAL HISTORY: Wyatt Berry 73 y.o. male returns to the clinic today for follow-up visit.Discussed the use of AI scribe software for clinical note transcription with the patient, who gave verbal consent to proceed.  History of Present Illness   Wyatt Berry is a 73 year old male with stage 1B non-small cell lung cancer who presents for evaluation and discussion post-surgery.  He was diagnosed with stage 1B non-small cell lung cancer, adenocarcinoma, in March 2025 and underwent a right middle lobectomy with mediastinal lymph node sampling on Sep 25, 2023. The tumor measured 2.5 cm and involved the visceral pleura.  Post-surgery, he is recovering well and engaging in activities such as cutting grass. He experiences occasional mild stinging sensations on the right side but no significant pain and is not taking Tylenol  for pain management. He has discontinued Chantix  due to adverse effects.  He has a light cough occasionally but no shortness of breath, nausea, vomiting, diarrhea, headaches, or changes in vision. He is uncertain about weight loss but mentions eating well and possibly losing a pound or two. He notes a bump on his back.       MEDICAL HISTORY: Past Medical History:  Diagnosis Date   AAA (abdominal aortic aneurysm) without rupture (HCC) 01/01/2021   3.7 cm infrarenal AAA on PET 02/2022   Cancer [facility])     bladder   Cirrhosis of liver (HCC)    COPD (chronic obstructive pulmonary disease) (HCC)    Per patient mild   DVT (deep venous thrombosis) (HCC) 05/02/2016   subacute LLE DVT 05/02/16   Hepatitis C    HCV, s/p treatment   HLD (hyperlipidemia)    Hypertension    Lung nodule 02/23/2022   left lung   PE (pulmonary thromboembolism) (HCC) 05/02/2016   bilateral at least submassive PE with right heart strain, s/p Xarelto  x 4 years   Pre-diabetes 12/2020   no meds    ALLERGIES:  has no known allergies.  MEDICATIONS:  Current Outpatient Medications  Medication Sig Dispense Refill   acetaminophen  (TYLENOL ) 325 MG tablet Take 2 tablets (650 mg total) by mouth every 4 (four) hours as needed for moderate pain (pain score 4-6).     amLODipine  (NORVASC ) 5 MG tablet Take 5 mg by mouth daily.     aspirin  EC 81 MG tablet Take 81 mg by mouth daily.     atorvastatin  (LIPITOR) 10 MG tablet Take 10 mg by mouth daily.     losartan -hydrochlorothiazide  (HYZAAR) 100-12.5 MG per tablet Take 1 tablet by mouth daily.     metoprolol  succinate (TOPROL -XL) 25 MG 24 hr tablet Take 25 mg by mouth at bedtime.     Multiple Vitamin (MULTIVITAMIN) tablet Take 1 tablet by mouth daily.     tamsulosin  (FLOMAX ) 0.4 MG CAPS capsule Take 0.4 mg by mouth at bedtime.     varenicline  (CHANTIX ) 1 MG tablet Take 1 mg by mouth 2 (two) times daily.  No current facility-administered medications for this visit.    SURGICAL HISTORY:  Past Surgical History:  Procedure Laterality Date   BRONCHIAL BIOPSY  03/22/2022   Procedure: BRONCHIAL BIOPSIES;  Surgeon: Prudy Brownie, DO;  [facility]: [facility];  Service: Pulmonary;;   BRONCHIAL BIOPSY  08/08/2023   Procedure: BRONCHOSCOPY, WITH BIOPSY;  Surgeon: Denson Flake, MD;  Location: [facility];  Service: Pulmonary;;   BRONCHIAL BRUSHINGS  08/08/2023   Procedure: BRONCHOSCOPY, WITH BRUSH BIOPSY;  Surgeon: Denson Flake, MD;  Location: [facility];  Service:  Pulmonary;;   BRONCHIAL NEEDLE ASPIRATION BIOPSY  03/22/2022   Procedure: BRONCHIAL NEEDLE ASPIRATION BIOPSIES;  Surgeon: Prudy Brownie, DO;  Location: [facility];  Service: Pulmonary;;   BRONCHIAL NEEDLE ASPIRATION BIOPSY  08/08/2023   Procedure: BRONCHOSCOPY, WITH NEEDLE ASPIRATION BIOPSY;  Surgeon: Denson Flake, MD;  [facility]: [facility];  Service: Pulmonary;;   COLONOSCOPY     CYSTOSCOPY W/ RETROGRADES Bilateral 06/14/2022   Procedure: CYSTOSCOPY WITH RETROGRADE PYELOGRAM;  Surgeon: Sherlyn Ditto, MD;  Location: [facility];  Service: Urology;  Laterality: Bilateral;   CYSTOSCOPY W/ RETROGRADES Bilateral 10/11/2022   Procedure: CYSTOSCOPY WITH RETROGRADE PYELOGRAM;  Surgeon: Sherlyn Ditto, MD;  Location: [facility];  Service: Urology;  Laterality: Bilateral;   INTERCOSTAL NERVE BLOCK Right 09/25/2023   Procedure: BLOCK, NERVE, INTERCOSTAL;  Surgeon: Hilarie Lovely, MD;  Location: [facility];  Service: Thoracic;  Laterality: Right;   IR PERC PLEURAL DRAIN W/INDWELL CATH W/IMG GUIDE  10/02/2023   LOBECTOMY, LUNG, ROBOT-ASSISTED, USING VATS Right 09/25/2023   Procedure: LOBECTOMY, LUNG, ROBOT-ASSISTED, USING VATS;  Surgeon: Hilarie Lovely, MD;  Location: [facility];  Service: Thoracic;  Laterality: Right;  RIGHT MIDDLE LOBECTOMY   TONSILLECTOMY     TRANSURETHRAL RESECTION OF BLADDER TUMOR N/A 06/14/2022   Procedure: TRANSURETHRAL RESECTION OF BLADDER TUMOR (TURBT), BLADDER BIOPSY;  Surgeon: Sherlyn Ditto, MD;  Location: [facility];  Service: Urology;  Laterality: N/A;  45 MINS   TRANSURETHRAL RESECTION OF BLADDER TUMOR N/A 10/11/2022   Procedure: TRANSURETHRAL RESECTION OF BLADDER TUMOR (TURBT);  Surgeon: Sherlyn Ditto, MD;  Location: [facility];  Service: Urology;  Laterality: N/A;  45 MINS   VIDEO BRONCHOSCOPY WITH ENDOBRONCHIAL NAVIGATION  08/08/2023   Procedure: VIDEO BRONCHOSCOPY WITH ENDOBRONCHIAL NAVIGATION;  Surgeon: Denson Flake, MD;  [facility];  Service: Pulmonary;;    VIDEO BRONCHOSCOPY WITH ENDOBRONCHIAL ULTRASOUND Bilateral 03/22/2022   Procedure: VIDEO BRONCHOSCOPY WITH ENDOBRONCHIAL ULTRASOUND;  Surgeon: Prudy Brownie, DO;  [facility];  Service: Pulmonary;  Laterality: Bilateral;    REVIEW OF SYSTEMS:  Constitutional: negative Eyes: negative Ears, nose, mouth, throat, and face: negative Respiratory: positive for pleurisy/chest pain Cardiovascular: negative Gastrointestinal: negative Genitourinary:negative Integument/breast: negative Hematologic/lymphatic: negative Musculoskeletal:negative Neurological: negative Behavioral/Psych: negative Endocrine: negative Allergic/Immunologic: negative   PHYSICAL EXAMINATION: General appearance: alert, cooperative, appears older than stated age, and no distress Head: Normocephalic, without obvious abnormality, atraumatic Neck: no adenopathy, no JVD, supple, symmetrical, trachea midline, and thyroid  not enlarged, symmetric, no tenderness/mass/nodules Lymph nodes: Cervical, supraclavicular, and axillary nodes normal. Resp: clear to auscultation bilaterally Back: symmetric, no curvature. ROM normal. No CVA tenderness. Cardio: regular rate and rhythm, S1, S2 normal, no murmur, click, rub or gallop GI: soft, non-tender; bowel sounds normal; no masses,  no organomegaly Extremities: extremities normal, atraumatic, no cyanosis or edema Neurologic: Alert and oriented X 3, normal strength and tone. Normal symmetric reflexes. Normal coordination and gait  ECOG PERFORMANCE STATUS: 1 - Symptomatic but  completely ambulatory  Blood pressure (!) 152/82, pulse 79, temperature 97.6 F (36.4 C), temperature source Temporal, resp. rate 18, height 6' 1 (1.854 m), weight 227 lb (103 kg), SpO2 99%.  LABORATORY DATA: Lab Results  Component Value Date   WBC 5.1 11/06/2023   HGB 13.4 11/06/2023   HCT 39.3 11/06/2023   MCV 86.8 11/06/2023   PLT 203 11/06/2023      Chemistry      Component Value Date/Time    NA 139 09/27/2023 0245   K 4.3 09/27/2023 0245   CL 106 09/27/2023 0245   CO2 25 09/27/2023 0245   BUN 12 09/27/2023 0245   CREATININE 1.02 09/27/2023 0245   CREATININE 1.21 08/22/2023 1327      Component Value Date/Time   CALCIUM  8.5 (L) 09/27/2023 0245   ALKPHOS 44 09/27/2023 0245   AST 33 09/27/2023 0245   AST 37 08/22/2023 1327   ALT 14 09/27/2023 0245   ALT 20 08/22/2023 1327   BILITOT 0.8 09/27/2023 0245   BILITOT 0.6 08/22/2023 1327       RADIOGRAPHIC STUDIES: No results found.  ASSESSMENT AND PLAN: Assessment and Plan    Stage 1B non-small cell lung cancer, adenocarcinoma Stage 1B non-small cell lung cancer, adenocarcinoma diagnosed in March 2025. Post right middle lobectomy with mediastinal lymph node sampling on Sep 25, 2023. Tumor size was 2.5 cm with visceral pleural involvement, classifying it as stage 1B. He is currently asymptomatic with no pain, dyspnea, or significant weight loss. Chemotherapy or radiation is not indicated at this time. The decision to monitor rather than pursue further treatment is based on the tumor's small size and absence of symptoms, with the goal of early detection of any recurrence. - Perform CT scan every six months for the first two years. - If scans are stable after two years, perform annual CT scans. - Schedule scans and labs one week prior to follow-up appointments. - Arrange for imaging to be done at a more cost-effective location, [place], if preferred by him.   The patient was advised to call immediately if he has any other concerning symptoms in the interval. The patient voices understanding of current disease status and treatment options and is in agreement with the current care plan.  All questions were answered. The patient knows to call the clinic with any problems, questions or concerns. We can certainly see the patient much sooner if necessary.  The total time spent in the appointment was 30 minutes.  Disclaimer: This  note was dictated with voice recognition software. Similar sounding words can inadvertently be transcribed and may not be corrected upon review.

## 2023-11-07 ENCOUNTER — Telehealth: Payer: Self-pay | Admitting: Internal Medicine

## 2023-11-07 NOTE — Telephone Encounter (Signed)
 Scheduled appointments per 6/16 los. Called and left a VM with appointment details.

## 2023-11-08 ENCOUNTER — Other Ambulatory Visit: Payer: Self-pay | Admitting: Thoracic Surgery (Cardiothoracic Vascular Surgery)

## 2023-11-08 DIAGNOSIS — Z902 Acquired absence of lung [part of]: Secondary | ICD-10-CM

## 2023-11-09 ENCOUNTER — Other Ambulatory Visit: Payer: Self-pay | Admitting: Nurse Practitioner

## 2023-11-09 DIAGNOSIS — K7469 Other cirrhosis of liver: Secondary | ICD-10-CM | POA: Diagnosis not present

## 2023-11-09 NOTE — Progress Notes (Signed)
 [facility] [place]  Patient: Wyatt Berry Age: 73 y.o.        DOB: [birth year]  MRN: [id number] Sex: male      DATE: 11/09/23    Primary Care Provider:  Ardell Seen, MD [facility] [address], [address] [place], [place] [address] [phone] Fax: [phone]  Subjective   Wyatt Berry is a 73 y.o. Black or [place] American male who is alone and serves as his own historian.  Wyatt Berry was last seen by me at [facility] on 05/04/23 and is here today for follow up.  Patient with compensated cirrhosis secondary to HCV.   Patient has a history of hepatitis C status post treatment with sustained virologic response and cure.   Prior to initiating therapy he had an ultrasound with elastography that demonstrated F3-F4 fibrosis and a nodular contour to the liver.  His most recent abdominal ultrasound had no evidence of hepatoma. He denies any increased abdominal girth, melena, hematemesis, hematochezia, problems with memory/confusion, or day night reversal.  Patient was found to have a lung nodule on screening CT subsequently found to be PET avid.  He underwent bronchoscopy with no evidence of malignancy.  He is being followed by pulmonology and was recommended consideration of empiric resection versus XRT but is opted to proceed with ongoing surveillance.  Most recent CT in 01/2023 with stable lesion.  He has also been diagnosed with COPD.  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  Patient diagnosed with lung cancer and underwent left middle lobectomy in May 2025.     Patient Active Problem List  Diagnosis  . Abdominal aortic aneurysm without rupture  . Benign prostatic hyperplasia  . Other cirrhosis of liver    (CMD)  . Degeneration of lumbar intervertebral disc  . Hardening of the aorta (main artery of the heart)  . History of adenomatous polyp of colon  .  Essential hypertension  . Prediabetes  . COPD (chronic obstructive pulmonary disease)    (CMD)  . Lung nodule  . Calculus of gallbladder without cholecystitis without obstruction    Past Surgical History:  Procedure Laterality Date  . BRONCHOSCOPY  02/2022  . TONSILLECTOMY AND ADENOIDECTOMY      Family History  Problem Relation Name Age of Onset  . Diabetes type II Mother    . Heart disease Mother    . Diabetes type II Father    . Hypertension Father    . Hypertension Sister    . Diabetes type II Brother    . Liver disease Neg Hx       Review of Systems  Constitutional:  Negative for fatigue, fever and unexpected weight change.  Respiratory:  Negative for cough and shortness of breath.   Cardiovascular:  Negative for chest pain.  Gastrointestinal:  Negative for abdominal distention, abdominal pain, blood in stool and vomiting.  Skin:  Negative for color change.  Neurological:  Negative for tremors and speech difficulty.  Psychiatric/Behavioral:  Negative for confusion, decreased concentration and sleep disturbance.   All other systems reviewed and are negative.    No Known Allergies  Current Outpatient Medications  Medication Instructions  . amLODIPine  (NORVASC ) 5 mg, Daily  . aspirin  81 mg, Daily  . atorvastatin  (LIPITOR) 10 mg, At bedtime  . losartan -hydrochlorothiazide  (HYZAAR) 100-12.5 mg per tablet 1 tablet, Daily  . metoprolol  succinate (TOPROL  XL) 25 mg, At bedtime  .  tamsulosin  (FLOMAX ) 0.4 mg cap 1 capsule, Daily    Objective:    Vitals:   11/09/23 1014  BP: 140/82  Pulse: 79  Temp: 98 F (36.7 C)  SpO2: 98%   Height: 1.88 m (6' 2) (11/09/2023 10:14 AM)  Weight: 104 kg (228 lb 3.2 oz) (11/09/2023 10:14 AM)  Body mass index is 29.3 kg/m.  Physical Exam Vitals reviewed.  Constitutional:      General: He is not in acute distress.    Appearance: Normal appearance.   Eyes:     General: No scleral icterus.    Extraocular Movements: Extraocular  movements intact.     Pupils: Pupils are equal, round, and reactive to light.    Cardiovascular:     Rate and Rhythm: Normal rate and regular rhythm.     Heart sounds: No murmur heard. Pulmonary:     Effort: Pulmonary effort is normal. No respiratory distress.     Breath sounds: Normal breath sounds.  Abdominal:     General: There is no distension.     Palpations: Abdomen is soft. There is no shifting dullness, fluid wave, hepatomegaly, splenomegaly or mass.     Tenderness: There is no abdominal tenderness.     Hernia: No hernia is present.   Musculoskeletal:     Right lower leg: No edema.     Left lower leg: No edema.  Lymphadenopathy:     Cervical: No cervical adenopathy.   Skin:    General: Skin is warm and dry.     Coloration: Skin is not jaundiced.   Neurological:     Mental Status: He is alert and oriented to person, place, and time.     Motor: No tremor.     Gait: Gait is intact.     Comments: No asterixis  Psychiatric:        Mood and Affect: Mood normal.        Behavior: Behavior normal.        Thought Content: Thought content normal.        Judgment: Judgment normal.      LABS:       IMAGING: 05/08/2023 ULTRASOUND ABDOMEN LIMITED RIGHT UPPER QUADRANT   COMPARISON:  Ultrasound abdomen 11/30/2022   FINDINGS:  Gallbladder:   Small amount of sludge and possible small stones in the gallbladder  lumen. No gallbladder wall thickening or pericholecystic fluid.  Negative sonographic Murphy's sign.   Common bile duct:   Diameter: 3 mm   Liver:   Increased echogenicity. No focal lesion. Portal vein is patent on  color Doppler imaging with normal direction of blood flow towards  the liver.   Other: None.   IMPRESSION:  1. Increased hepatic parenchymal echogenicity suggestive of  steatosis.  2. Small amount of sludge and possible small stones in the  gallbladder lumen. No secondary signs to suggest acute  cholecystitis.    Assessment / Plan:    Problem List Items Addressed This Visit     Other cirrhosis of liver    (CMD) - Primary   Patient has a history of well compensated cirrhosis based on F3-F4 fibrosis by elastography and a nodular contour to the liver.   He has well-preserved liver synthetic function.   Elastography measured his median hepatic shear wave velocity of 3.75m/sec corresponding with a liver stiffness of greater than 30 kPa. He has no evidence of thrombocytopenia and no splenomegaly on complete abdominal ultrasound in 2019. While he does have a liver stiffness that would  suggest the need for variceal screening he has no other evidence of portal hypertension most recent ultrasound shows normal directional blood flow through the portal vein. Consideration could be given to FibroScan in the future to assess liver stiffness as it will to determine need for variceal screening.  He is due for hepatoma screening and I have ordered a right upper quadrant ultrasound. He is aware that he will need lifelong surveillance for hepatoma even if he would have improvement in his degree of liver fibrosis over time.        Relevant Orders   US  Abdomen Limited    PLAN: Hepatoma screening: RUQ US   Follow up in 6 month(s)  A total of 30 minutes were spent face-to-face with the patient today. During this visit, we discussed the patient's current health concerns and reviewed their medical history. I provided counseling and education regarding their treatment plan and addressed any questions the patient had. Additionally, time was spent reviewing relevant test results and coordinating care with other healthcare providers, as necessary.   Stephane Quest, MSN, NP, A-GPCNP-BC Nurse Practitioner  [facility] [place] Office: [phone]  Fax: [phone]   [facility]  Mailing: [address], [place], [place] [address]

## 2023-11-10 ENCOUNTER — Ambulatory Visit
Admission: RE | Admit: 2023-11-10 | Discharge: 2023-11-10 | Disposition: A | Discharge status: Home / Self Care | Source: Ambulatory Visit | Attending: Nurse Practitioner | Admitting: Nurse Practitioner

## 2023-11-10 DIAGNOSIS — K7469 Other cirrhosis of liver: Secondary | ICD-10-CM

## 2023-11-10 DIAGNOSIS — K7689 Other specified diseases of liver: Secondary | ICD-10-CM | POA: Diagnosis not present

## 2023-11-10 DIAGNOSIS — K8020 Calculus of gallbladder without cholecystitis without obstruction: Secondary | ICD-10-CM | POA: Diagnosis not present

## 2023-11-16 NOTE — Telephone Encounter (Signed)
 I discussed the ultrasound results with the patient. He verbalized understanding of the signs indicating gallstone complications, including symptoms that would necessitate a visit to the ED.

## 2023-11-16 NOTE — Telephone Encounter (Signed)
-----   Message from Stephane Quest, NP sent at 11/15/2023  8:31 PM EDT ----- Please advise patient that ultrasound shows no evidence of liver cancer.   They do have gallstones.   Please advise patient: Gallstones that don't cause any signs and symptoms typically don't need treatment.  Approximately 4% of people with gallstones may develop symptoms.   People who experience symptoms from their  gallstones may require gallbladder removal surgery.  If a gallstone lodges in a duct and causes a blockage, the resulting signs and symptoms often occur approximately 20-30 minutes after a meal and may include: Sudden and rapidly intensifying pain in  the upper right portion of the abdomen; Sudden and rapidly intensifying pain in the center of center abdomen, just below the breastbone; back pain between the shoulder blades; Pain in the right  shoulder; Nausea or vomiting. Gallstone pain may last several minutes to a few hours. If these symptoms occur frequently they should make an appointment with their PCP.   They should seek immediate care if they develop signs and symptoms of a serious gallstone complication, such as: Abdominal pain so intense that they can't sit still or find a comfortable position,  jaundice, High fever with chills   ----- Message ----- From: Delmar Fall Doc In [id number] Sent: 11/15/2023   9:40 AM EDT To: Stephane DELENA Quest, NP

## 2023-11-16 NOTE — Telephone Encounter (Signed)
 Left voicemail requesting a return call to discuss results.

## 2023-11-21 ENCOUNTER — Ambulatory Visit: Attending: Thoracic Surgery (Cardiothoracic Vascular Surgery) | Admitting: Surgical

## 2023-11-21 ENCOUNTER — Ambulatory Visit (HOSPITAL_COMMUNITY)
Admission: RE | Admit: 2023-11-21 | Discharge: 2023-11-21 | Disposition: A | Discharge status: Home / Self Care | Source: Ambulatory Visit | Attending: Cardiology | Admitting: Cardiology

## 2023-11-21 VITALS — BP 156/77 | HR 67 | Resp 20 | Ht 73.0 in | Wt 230.0 lb

## 2023-11-21 DIAGNOSIS — Z48813 Encounter for surgical aftercare following surgery on the respiratory system: Secondary | ICD-10-CM | POA: Diagnosis not present

## 2023-11-21 DIAGNOSIS — Z902 Acquired absence of lung [part of]: Secondary | ICD-10-CM | POA: Insufficient documentation

## 2023-11-21 DIAGNOSIS — T797XXA Traumatic subcutaneous emphysema, initial encounter: Secondary | ICD-10-CM | POA: Diagnosis not present

## 2023-11-21 DIAGNOSIS — R918 Other nonspecific abnormal finding of lung field: Secondary | ICD-10-CM | POA: Diagnosis not present

## 2023-11-21 NOTE — Progress Notes (Signed)
 [address]       [place] [address]             [phone]      DAVELL BECKSTEAD [facility] Medical Record #[id number] Date of Birth: 1951/04/03  Referring: Heilingoetter, Software engineer* Primary Care: Ransom Other, MD Primary Cardiologist: None   Chief Complaint:   POST OP FOLLOW UP  09/25/2023   Patient:  Wyatt Berry Pre-Op Dx: Right middle lobe NSCLC   Post-op Dx:  same Procedure: - Robotic assisted right video thoracoscopy - lysis of adhesion, complicating the case by 25% - right lobectomy - Mediastinal lymph node sampling - Intercostal nerve block   Surgeon and Role:      * Lightfoot, Linnie KIDD, MD - Primary   Assistant: CHARLENA Shad, PA-C   History of Present Illness:    Patient is a 73 year old male seen in the office on today's date status post the above described procedure.  Overall he reports that he is doing well.  From the chart it does appear he has had some gallbladder issues which are being addressed.  He has seen oncology and there is a plan for ongoing follow-up with serial scans.  He denies fevers, chills or other significant constitutional symptoms.  He denies shortness of breath.  He has some mild occasional intermittent pain associated with the incisions.  He is driving and doing well with his routine activities.  He has had no difficulties with his incisions.      Past Medical History:  Diagnosis Date   AAA (abdominal aortic aneurysm) without rupture (HCC) 01/01/2021   3.7 cm infrarenal AAA on PET 02/2022   Cancer [facility])    bladder   Cirrhosis of liver (HCC)    COPD (chronic obstructive pulmonary disease) (HCC)    Per patient mild   DVT (deep venous thrombosis) (HCC) 05/02/2016   subacute LLE DVT 05/02/16   Hepatitis C    HCV, s/p treatment   HLD (hyperlipidemia)    Hypertension    Lung nodule 02/23/2022   left lung   PE (pulmonary thromboembolism) (HCC) 05/02/2016   bilateral at least submassive PE with right heart  strain, s/p Xarelto  x 4 years   Pre-diabetes 12/2020   no meds     Social History   Tobacco Use  Smoking Status Some Days   Current packs/day: 0.25   Average packs/day: 0.3 packs/day for 52.5 years (13.1 ttl pk-yrs)   Types: Cigarettes   Start date: 1973  Smokeless Tobacco Never  Tobacco Comments   Pt states that he smokes less than 5 cigarettes a day. 04/05/22 ALS     Social History   Substance and Sexual Activity  Alcohol Use No     No Known Allergies  Current Outpatient Medications  Medication Sig Dispense Refill   acetaminophen  (TYLENOL ) 325 MG tablet Take 2 tablets (650 mg total) by mouth every 4 (four) hours as needed for moderate pain (pain score 4-6).     amLODipine  (NORVASC ) 5 MG tablet Take 5 mg by mouth daily.     aspirin  EC 81 MG tablet Take 81 mg by mouth daily.     atorvastatin  (LIPITOR) 10 MG tablet Take 10 mg by mouth daily.     losartan -hydrochlorothiazide  (HYZAAR) 100-12.5 MG per tablet Take 1 tablet by mouth daily.     metoprolol  succinate (TOPROL -XL) 25 MG 24 hr tablet Take 25 mg by mouth at bedtime.     Multiple Vitamin (MULTIVITAMIN)  tablet Take 1 tablet by mouth daily.     tamsulosin  (FLOMAX ) 0.4 MG CAPS capsule Take 0.4 mg by mouth at bedtime.     varenicline  (CHANTIX ) 1 MG tablet Take 1 mg by mouth 2 (two) times daily.     No current facility-administered medications for this visit.       Physical Exam: BP (!) 156/77   Pulse 67   Resp 20   Ht 6' 1 (1.854 m)   Wt 230 lb (104.3 kg)   SpO2 96% Comment: RA  BMI 30.34 kg/m   General appearance: alert, cooperative, and no distress Heart: regular rate and rhythm Lungs: Slightly diminished in the right base Abdomen: Benign exam Extremities: Warm and well-perfused, no edema Wound: Incisions well-healed without evidence of infection   Diagnostic Studies & Laboratory data:     Recent Radiology Findings:   DG Chest 2 View Result Date: 11/21/2023 CLINICAL DATA:  Postop lobectomy. EXAM:  CHEST - 2 VIEW COMPARISON:  Radiographs 10/05/2023 and 10/04/2023.  CT 10/01/2023. FINDINGS: The heart size and mediastinal contours are stable. There is stable volume loss in the right hemithorax from previous right middle lobectomy. Associated chronic blunting of both costophrenic angles and mild patchy right basilar airspace disease. No residual pneumothorax or chest wall soft tissue emphysema. The bones appear unremarkable. IMPRESSION: 1. Interval resolution of previously demonstrated soft tissue emphysema. Otherwise stable postoperative changes in the right hemithorax. No residual pneumothorax. 2. Mild patchy right basilar airspace disease, likely atelectasis. Electronically Signed   By: Elsie Perone M.D.   On: 11/21/2023 09:38      Recent Lab Findings: Lab Results  Component Value Date   WBC 5.1 11/06/2023   HGB 13.4 11/06/2023   HCT 39.3 11/06/2023   PLT 203 11/06/2023   GLUCOSE 133 (H) 11/06/2023   ALT 14 11/06/2023   AST 21 11/06/2023   NA 138 11/06/2023   K 3.8 11/06/2023   CL 105 11/06/2023   CREATININE 1.10 11/06/2023   BUN 11 11/06/2023   CO2 26 11/06/2023   INR 1.1 10/02/2023   HGBA1C 5.7 (H) 10/10/2022     Path:  FINAL MICROSCOPIC DIAGNOSIS:  A. LUNG, RIGHT MIDDLE LOBE, LOBECTOMY: - Invasive papillary adenocarcinoma. - One intrapulmonary lymph node negative for malignancy (0/1). - See cancer summary below.   CASE SUMMARY: LUNG Standard(s): AJCC 9  SPECIMEN Synchronous Tumors: Not applicable Procedure: Lobectomy Specimen Laterality: Right  TUMOR Tumor Focality: Single focus Tumor Site: Middle lobe of lung Tumor Size      Invasive Tumor Size: 2.5 cm Histologic Type: Invasive papillary adenocarcinoma Histologic Patterns:      Papillary: 90%      Solid: 5%      Micropapillary: 5% Histologic Patterns: G2, moderately differentiated Spread Through Retail banker (STAS): Present Visceral Pleura Invasion: Present Direct Invasion of Adjacent Structures: Not  applicable (no other structures present) Treatment Effect: No known presurgical therapy Lymphatic and / or Vascular Invasion: Not identified  MARGINS Margin Status for Invasive Tumor: All margins negative for invasive tumor      Closest margin to invasive tumor: Bronchial      Distance from invasive tumor to closest margin labeled: 2 cm  Margin Status for Non-Invasive Tumor: All margins negative for non-invasive tumor  REGIONAL LYMPH NODES Lymph Node(s) from Prior Procedures: No known prior lymph node sampling performed Regional Lymph Node Status: All regional lymph nodes negative for tumor      Number of lymph nodes examined: 1  DISTANT METASTASIS Distant  Site(s) Involved, if applicable: Not applicable  PATHOLOGIC STAGE CLASSIFICATION (pTNM, AJCC 9th Edition): Modified Classification: Not applicable pT2a      T Suffix: Not applicable pN0     Assessment / Plan: Overall doing well.  He will continue ongoing oncology surveillance with Dr. Gatha.  His chest x-ray was reviewed and is as described above.  He did have a notable amount of scar tissue he reportedly had surgery but likely the findings are most consistent with atelectasis/adhesions.  I did not make any changes to his current medication regimen.  We will see the patient again on a as needed basis for any surgically related needs or at request.      Medication Changes: No orders of the defined types were placed in this encounter.     Wyatt Vanauken E Haeleigh Streiff, PA-C  11/21/2023 9:43 AM

## 2023-11-21 NOTE — Patient Instructions (Signed)
Resume regular activities as tolerated.

## 2024-01-03 DIAGNOSIS — R7303 Prediabetes: Secondary | ICD-10-CM | POA: Diagnosis not present

## 2024-01-03 DIAGNOSIS — Z8551 Personal history of malignant neoplasm of bladder: Secondary | ICD-10-CM | POA: Diagnosis not present

## 2024-01-03 DIAGNOSIS — I700 Atherosclerosis of aorta: Secondary | ICD-10-CM | POA: Diagnosis not present

## 2024-01-03 DIAGNOSIS — K7460 Unspecified cirrhosis of liver: Secondary | ICD-10-CM | POA: Diagnosis not present

## 2024-01-03 DIAGNOSIS — Z86711 Personal history of pulmonary embolism: Secondary | ICD-10-CM | POA: Diagnosis not present

## 2024-01-03 DIAGNOSIS — Z85118 Personal history of other malignant neoplasm of bronchus and lung: Secondary | ICD-10-CM | POA: Diagnosis not present

## 2024-01-03 DIAGNOSIS — I10 Essential (primary) hypertension: Secondary | ICD-10-CM | POA: Diagnosis not present

## 2024-01-03 DIAGNOSIS — J439 Emphysema, unspecified: Secondary | ICD-10-CM | POA: Diagnosis not present

## 2024-01-17 NOTE — Telephone Encounter (Signed)
 error

## 2024-01-25 DIAGNOSIS — C678 Malignant neoplasm of overlapping sites of bladder: Secondary | ICD-10-CM | POA: Diagnosis not present

## 2024-04-23 ENCOUNTER — Other Ambulatory Visit

## 2024-04-29 ENCOUNTER — Inpatient Hospital Stay
Admission: RE | Admit: 2024-04-29 | Discharge: 2024-04-29 | Disposition: A | Source: Ambulatory Visit | Attending: Internal Medicine | Admitting: Internal Medicine

## 2024-04-29 DIAGNOSIS — C3490 Malignant neoplasm of unspecified part of unspecified bronchus or lung: Secondary | ICD-10-CM

## 2024-04-29 MED ORDER — IOPAMIDOL (ISOVUE-300) INJECTION 61%
75.0000 mL | Freq: Once | INTRAVENOUS | Status: AC | PRN
  Administered 2024-04-29: 75 mL via INTRAVENOUS

## 2024-04-30 ENCOUNTER — Inpatient Hospital Stay: Attending: Internal Medicine

## 2024-04-30 DIAGNOSIS — Z902 Acquired absence of lung [part of]: Secondary | ICD-10-CM | POA: Insufficient documentation

## 2024-04-30 DIAGNOSIS — C3490 Malignant neoplasm of unspecified part of unspecified bronchus or lung: Secondary | ICD-10-CM

## 2024-04-30 DIAGNOSIS — C342 Malignant neoplasm of middle lobe, bronchus or lung: Secondary | ICD-10-CM | POA: Diagnosis present

## 2024-04-30 LAB — CBC WITH DIFFERENTIAL (CANCER CENTER ONLY)
Abs Immature Granulocytes: 0 K/uL (ref 0.00–0.07)
Basophils Absolute: 0 K/uL (ref 0.0–0.1)
Basophils Relative: 0 %
Eosinophils Absolute: 0.1 K/uL (ref 0.0–0.5)
Eosinophils Relative: 2 %
HCT: 40.5 % (ref 39.0–52.0)
Hemoglobin: 13.6 g/dL (ref 13.0–17.0)
Immature Granulocytes: 0 %
Lymphocytes Relative: 42 %
Lymphs Abs: 2.1 K/uL (ref 0.7–4.0)
MCH: 28.5 pg (ref 26.0–34.0)
MCHC: 33.6 g/dL (ref 30.0–36.0)
MCV: 84.7 fL (ref 80.0–100.0)
Monocytes Absolute: 0.4 K/uL (ref 0.1–1.0)
Monocytes Relative: 9 %
Neutro Abs: 2.4 K/uL (ref 1.7–7.7)
Neutrophils Relative %: 47 %
Platelet Count: 248 K/uL (ref 150–400)
RBC: 4.78 MIL/uL (ref 4.22–5.81)
RDW: 14.7 % (ref 11.5–15.5)
WBC Count: 5 K/uL (ref 4.0–10.5)
nRBC: 0 % (ref 0.0–0.2)

## 2024-04-30 LAB — CMP (CANCER CENTER ONLY)
ALT: 18 U/L (ref 0–44)
AST: 33 U/L (ref 15–41)
Albumin: 4.2 g/dL (ref 3.5–5.0)
Alkaline Phosphatase: 80 U/L (ref 38–126)
Anion gap: 12 (ref 5–15)
BUN: 13 mg/dL (ref 8–23)
CO2: 24 mmol/L (ref 22–32)
Calcium: 9.7 mg/dL (ref 8.9–10.3)
Chloride: 102 mmol/L (ref 98–111)
Creatinine: 1.12 mg/dL (ref 0.61–1.24)
GFR, Estimated: >60 mL/min (ref >60)
Glucose, Bld: 101 mg/dL — ABNORMAL HIGH (ref 70–99)
Potassium: 4.1 mmol/L (ref 3.5–5.1)
Sodium: 138 mmol/L (ref 135–145)
Total Bilirubin: 0.4 mg/dL (ref 0.0–1.2)
Total Protein: 8.3 g/dL — ABNORMAL HIGH (ref 6.5–8.1)

## 2024-05-07 ENCOUNTER — Ambulatory Visit: Admitting: Internal Medicine

## 2024-05-07 VITALS — BP 150/75 | HR 77 | Temp 97.8°F | Resp 17 | Ht 73.0 in | Wt 217.0 lb

## 2024-05-07 DIAGNOSIS — C3490 Malignant neoplasm of unspecified part of unspecified bronchus or lung: Secondary | ICD-10-CM | POA: Diagnosis not present

## 2024-05-07 DIAGNOSIS — C342 Malignant neoplasm of middle lobe, bronchus or lung: Secondary | ICD-10-CM | POA: Diagnosis not present

## 2024-05-07 NOTE — Progress Notes (Signed)
 [facility] [phone]   [phone]  OFFICE PROGRESS NOTE  Ransom Other, MD [address] [place] [address]  DIAGNOSIS: Stage IB (T2a, N0, M0) non-small cell lung cancer, adenocarcinoma diagnosed in March 2025 and presented with right middle lobe lung nodule  PRIOR THERAPY: Status post right middle lobectomy with mediastinal lymph node sampling under the care of Dr. Shyrl on 09/25/2023.  The final pathology showed 2.5 cm adenocarcinoma with visceral pleural involvement.  CURRENT THERAPY: Observation.  INTERVAL HISTORY: Wyatt Berry 73 y.o. male returns to the clinic today for follow-up visit. Discussed the use of AI scribe software for clinical note transcription with the patient, who gave verbal consent to proceed.  History of Present Illness Wyatt Berry is a 73 year old male with stage IB non-small cell lung adenocarcinoma, status post right middle lobectomy, who presents for routine oncology surveillance.  He was diagnosed with stage IB non-small cell lung adenocarcinoma of the right middle lobe in March 2025 and underwent right middle lobectomy with mediastinal lymph node sampling in May 2025. He has been managed with observation and is not receiving adjuvant therapy or cancer-directed medications.  He remains asymptomatic since his last visit six months ago. He denies new chest pain, dyspnea, hemoptysis, cough, nausea, vomiting, or diarrhea. He reports occasional mild right-sided stinging sensation, which he attributes to the surgical site. He is unsure about any recent weight loss but otherwise feels well, with no fatigue, fevers, chills, or sweats.  The patient had a chest CT performed last week.    MEDICAL HISTORY: Past Medical History:  Diagnosis Date   AAA (abdominal aortic aneurysm) without rupture 01/01/2021   3.7 cm infrarenal AAA on PET 02/2022   Cancer [facility])    bladder   Cirrhosis of liver (HCC)     COPD (chronic obstructive pulmonary disease) (HCC)    Per patient mild   DVT (deep venous thrombosis) (HCC) 05/02/2016   subacute LLE DVT 05/02/16   Hepatitis C    HCV, s/p treatment   HLD (hyperlipidemia)    Hypertension    Lung nodule 02/23/2022   left lung   PE (pulmonary thromboembolism) (HCC) 05/02/2016   bilateral at least submassive PE with right heart strain, s/p Xarelto  x 4 years   Pre-diabetes 12/2020   no meds    ALLERGIES:  has no known allergies.  MEDICATIONS:  Current Outpatient Medications  Medication Sig Dispense Refill   acetaminophen  (TYLENOL ) 325 MG tablet Take 2 tablets (650 mg total) by mouth every 4 (four) hours as needed for moderate pain (pain score 4-6).     amLODipine  (NORVASC ) 5 MG tablet Take 5 mg by mouth daily.     aspirin  EC 81 MG tablet Take 81 mg by mouth daily.     atorvastatin  (LIPITOR) 10 MG tablet Take 10 mg by mouth daily.     losartan -hydrochlorothiazide  (HYZAAR) 100-12.5 MG per tablet Take 1 tablet by mouth daily.     metoprolol  succinate (TOPROL -XL) 25 MG 24 hr tablet Take 25 mg by mouth at bedtime.     Multiple Vitamin (MULTIVITAMIN) tablet Take 1 tablet by mouth daily.     tamsulosin  (FLOMAX ) 0.4 MG CAPS capsule Take 0.4 mg by mouth at bedtime.     varenicline  (CHANTIX ) 1 MG tablet Take 1 mg by mouth 2 (two) times daily.     No current facility-administered medications for this visit.    SURGICAL HISTORY:  Past Surgical History:  Procedure Laterality Date   BRONCHIAL BIOPSY  03/22/2022   Procedure: BRONCHIAL BIOPSIES;  Surgeon: Brenna Adine CROME, DO;  Location: [facility] ENDOSCOPY;  Service: Pulmonary;;   BRONCHIAL BIOPSY  08/08/2023   Procedure: BRONCHOSCOPY, WITH BIOPSY;  Surgeon: Shelah Lamar RAMAN, MD;  Location[facility] ENDOSCOPY;  Service: Pulmonary;;   BRONCHIAL BRUSHINGS  08/08/2023   Procedure: BRONCHOSCOPY, WITH BRUSH BIOPSY;  Surgeon: Shelah Lamar RAMAN, MD;  Location: [facility] ENDOSCOPY;  Service: Pulmonary;;   BRONCHIAL NEEDLE ASPIRATION  BIOPSY  03/22/2022   Procedure: BRONCHIAL NEEDLE ASPIRATION BIOPSIES;  Surgeon: Brenna Adine CROME, DO;  Location: [facility] ENDOSCOPY;  Service: Pulmonary;;   BRONCHIAL NEEDLE ASPIRATION BIOPSY  08/08/2023   Procedure: BRONCHOSCOPY, WITH NEEDLE ASPIRATION BIOPSY;  Surgeon: Shelah Lamar RAMAN, MD;  Location: [facility] ENDOSCOPY;  Service: Pulmonary;;   COLONOSCOPY     CYSTOSCOPY W/ RETROGRADES Bilateral 06/14/2022   Procedure: CYSTOSCOPY WITH RETROGRADE PYELOGRAM;  Surgeon: Rosalind Zachary NOVAK, MD;  Location: [facility] ORS;  Service: Urology;  Laterality: Bilateral;   CYSTOSCOPY W/ RETROGRADES Bilateral 10/11/2022   Procedure: CYSTOSCOPY WITH RETROGRADE PYELOGRAM;  Surgeon: Rosalind Zachary NOVAK, MD;  Location: [facility] ORS;  Service: Urology;  Laterality: Bilateral;   INTERCOSTAL NERVE BLOCK Right 09/25/2023   Procedure: BLOCK, NERVE, INTERCOSTAL;  Surgeon: Shyrl Linnie KIDD, MD;  Location: [facility] OR;  Service: Thoracic;  Laterality: Right;   IR PERC PLEURAL DRAIN W/INDWELL CATH W/IMG GUIDE  10/02/2023   LOBECTOMY, LUNG, ROBOT-ASSISTED, USING VATS Right 09/25/2023   Procedure: LOBECTOMY, LUNG, ROBOT-ASSISTED, USING VATS;  Surgeon: Shyrl Linnie KIDD, MD;  Location: [facility] OR;  Service: Thoracic;  Laterality: Right;  RIGHT MIDDLE LOBECTOMY   TONSILLECTOMY     TRANSURETHRAL RESECTION OF BLADDER TUMOR N/A 06/14/2022   Procedure: TRANSURETHRAL RESECTION OF BLADDER TUMOR (TURBT), BLADDER BIOPSY;  Surgeon: Rosalind Zachary NOVAK, MD;  Location: [facility] ORS;  Service: Urology;  Laterality: N/A;  45 MINS   TRANSURETHRAL RESECTION OF BLADDER TUMOR N/A 10/11/2022   Procedure: TRANSURETHRAL RESECTION OF BLADDER TUMOR (TURBT);  Surgeon: Rosalind Zachary NOVAK, MD;  Location: [facility] ORS;  Service: Urology;  Laterality: N/A;  45 MINS   VIDEO BRONCHOSCOPY WITH ENDOBRONCHIAL NAVIGATION  08/08/2023   Procedure: VIDEO BRONCHOSCOPY WITH ENDOBRONCHIAL NAVIGATION;  Surgeon: Shelah Lamar RAMAN, MD;  Location: [facility] ENDOSCOPY;  Service: Pulmonary;;   VIDEO BRONCHOSCOPY WITH ENDOBRONCHIAL  ULTRASOUND Bilateral 03/22/2022   Procedure: VIDEO BRONCHOSCOPY WITH ENDOBRONCHIAL ULTRASOUND;  Surgeon: Brenna Adine CROME, DO;  Location: [facility] ENDOSCOPY;  Service: Pulmonary;  Laterality: Bilateral;    REVIEW OF SYSTEMS:  A comprehensive review of systems was negative except for: Respiratory: positive for pleurisy/chest pain   PHYSICAL EXAMINATION: General appearance: alert, cooperative, appears older than stated age, and no distress Head: Normocephalic, without obvious abnormality, atraumatic Neck: no adenopathy, no JVD, supple, symmetrical, trachea midline, and thyroid  not enlarged, symmetric, no tenderness/mass/nodules Lymph nodes: Cervical, supraclavicular, and axillary nodes normal. Resp: clear to auscultation bilaterally Back: symmetric, no curvature. ROM normal. No CVA tenderness. Cardio: regular rate and rhythm, S1, S2 normal, no murmur, click, rub or gallop GI: soft, non-tender; bowel sounds normal; no masses,  no organomegaly Extremities: extremities normal, atraumatic, no cyanosis or edema  ECOG PERFORMANCE STATUS: 1 - Symptomatic but completely ambulatory  Blood pressure (!) 150/75, pulse 77, temperature 97.8 F (36.6 C), temperature source Temporal, resp. rate 17, height 6' 1 (1.854 m), weight 217 lb (98.4 kg), SpO2 98%.  LABORATORY DATA: Lab Results  Component Value Date   WBC 5.0 04/30/2024   HGB 13.6 04/30/2024   HCT  40.5 04/30/2024   MCV 84.7 04/30/2024   PLT 248 04/30/2024      Chemistry      Component Value Date/Time   NA 138 04/30/2024 1338   K 4.1 04/30/2024 1338   CL 102 04/30/2024 1338   CO2 24 04/30/2024 1338   BUN 13 04/30/2024 1338   CREATININE 1.12 04/30/2024 1338      Component Value Date/Time   CALCIUM  9.7 04/30/2024 1338   ALKPHOS 80 04/30/2024 1338   AST 33 04/30/2024 1338   ALT 18 04/30/2024 1338   BILITOT 0.4 04/30/2024 1338       RADIOGRAPHIC STUDIES: CT Chest W Contrast Result Date: 05/03/2024 CLINICAL DATA:  Non-small-cell lung  cancer. Restaging. * Tracking Code: BO * EXAM: CT CHEST WITH CONTRAST TECHNIQUE: Multidetector CT imaging of the chest was performed during intravenous contrast administration. RADIATION DOSE REDUCTION: This exam was performed according to the departmental dose-optimization program which includes automated exposure control, adjustment of the mA and/or kV according to patient size and/or use of iterative reconstruction technique. CONTRAST:  75mL ISOVUE -300 IOPAMIDOL  (ISOVUE -300) INJECTION 61% COMPARISON:  10/01/2023 FINDINGS: Cardiovascular: The heart size is normal. No substantial pericardial effusion. Coronary artery calcification is evident. Mild atherosclerotic calcification is noted in the wall of the thoracic aorta. Enlargement of the pulmonary outflow tract/main pulmonary arteries suggests pulmonary arterial hypertension. Mediastinum/Nodes: No mediastinal lymphadenopathy. There is no hilar lymphadenopathy. The esophagus has normal imaging features. There is no axillary lymphadenopathy. Lungs/Pleura: Centrilobular and paraseptal emphysema evident. Dominant 11 mm posterior left lower lobe pulmonary nodule was 12 mm previously. Architectural distortion in the peripheral lower lobes bilaterally is again noted with some decreased in confluent airspace opacity in the posterior right lower lobe suggesting component of underlying collapse on the previous exam. Right pneumothorax noted on the prior study has resolved completely in the interval. Surgical changes again noted in the right hilar region. Upper Abdomen: Stable 10 mm low-density lesion in the left liver. Nodular hepatic contour evident. No adrenal nodule or mass. Tiny well-defined homogeneous low-density lesions in both kidneys are too small to characterize but are statistically most likely benign and probably cysts. No followup imaging is recommended. Musculoskeletal: No worrisome lytic or sclerotic osseous abnormality. IMPRESSION: 1. Stable exam. No new or  progressive findings to suggest recurrent or metastatic disease. 2. Stable 11 mm posterior left lower lobe pulmonary nodule. 3. Architectural distortion in the peripheral lower lobes bilaterally with some decreased in confluent airspace opacity in the posterior right lower lobe suggesting component of underlying collapse on the previous exam. 4. Enlargement of the pulmonary outflow tract/main pulmonary arteries suggests pulmonary arterial hypertension. 5. Nodular hepatic contour suggests cirrhosis. 6. Aortic Atherosclerosis (ICD10-I70.0) and Emphysema (ICD10-J43.9). Electronically Signed   By: Camellia Candle M.D.   On: 05/03/2024 07:37    ASSESSMENT AND PLAN: Assessment and Plan This is a pleasant 73 years old African-American male with Stage IB (T2a, N0, M0) non-small cell lung cancer, adenocarcinoma diagnosed in March 2025 and presented with right middle lobe lung nodule He is status post right middle lobectomy with mediastinal lymph node sampling under the care of Dr. Shyrl on 09/25/2023.  The final pathology showed 2.5 cm adenocarcinoma with visceral pleural involvement. The patient is currently on observation. He had repeat CT scan of the chest performed recently.  I personally and independently reviewed the scan and discussed the result with the patient today.  His scan showed no concerning findings for disease recurrence or metastasis. Assessment and Plan Assessment & Plan  Stage I non-small cell lung cancer of the right middle lobe, post-lobectomy Status post right middle lobectomy with mediastinal lymph node sampling for stage I non-small cell lung cancer. Currently in the observation period. Surveillance chest CT demonstrated a stable 11 mm subpleural nodule with no evidence of growth or metastatic disease. He remains asymptomatic with no new respiratory or constitutional symptoms. No evidence of recurrence. - Reviewed recent chest CT findings confirming stability and absence of progression. -  Scheduled follow-up visit and repeat chest CT in six months for continued surveillance. - Surveillance interval discussed: every six months for the first two years post-surgery, then annually thereafter. The patient was advised to call immediately if he has any concerning symptoms in the interval. The patient voices understanding of current disease status and treatment options and is in agreement with the current care plan.  All questions were answered. The patient knows to call the clinic with any problems, questions or concerns. We can certainly see the patient much sooner if necessary.  The total time spent in the appointment was 20 minutes including review of chart and various tests results, discussions about plan of care and coordination of care plan .   Disclaimer: This note was dictated with voice recognition software. Similar sounding words can inadvertently be transcribed and may not be corrected upon review.

## 2024-05-15 IMAGING — US US ABDOMEN LIMITED
1 series · 14 of 25 positions shown · non-contrast
Comparison: 05/06/2021

CLINICAL DATA: Cirrhosis

EXAM:
ULTRASOUND ABDOMEN LIMITED RIGHT UPPER QUADRANT

[Series 1: us abdomen limited · 0.15mm/px · 14 of 47 slices shown]
[im 1/47]
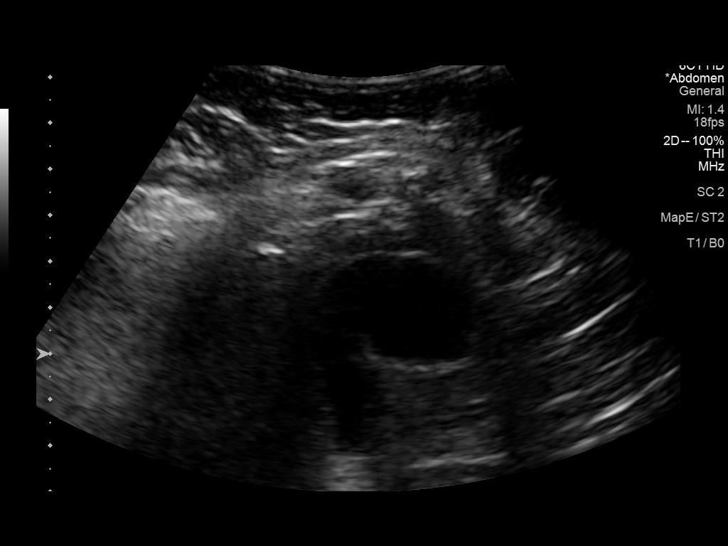
[im 4/47]
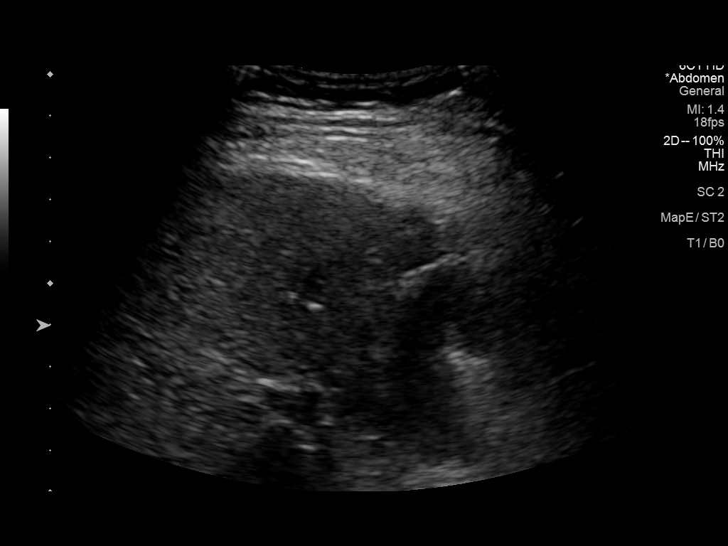
[im 8/47]
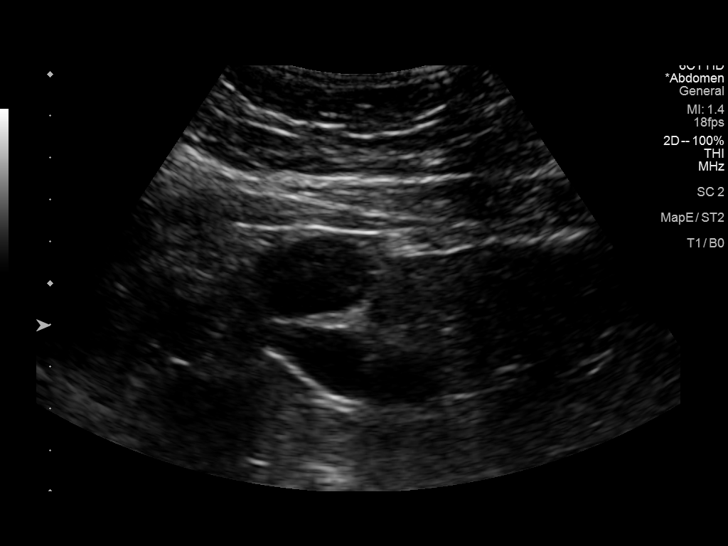
[im 12/47]
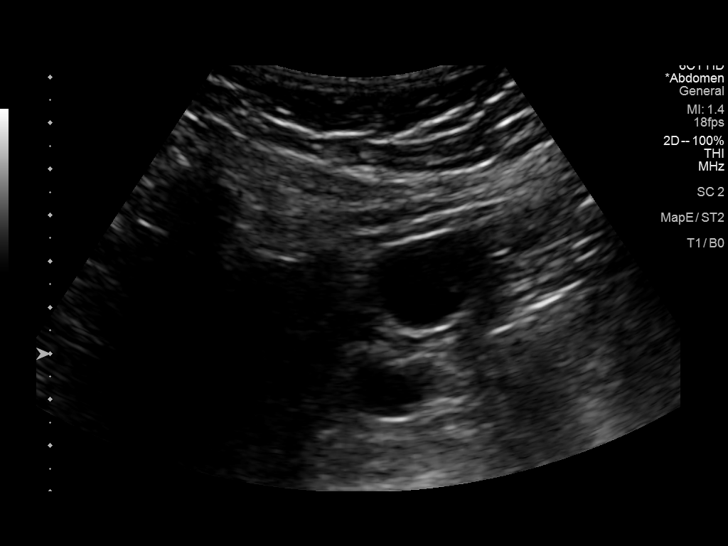
[im 16/47]
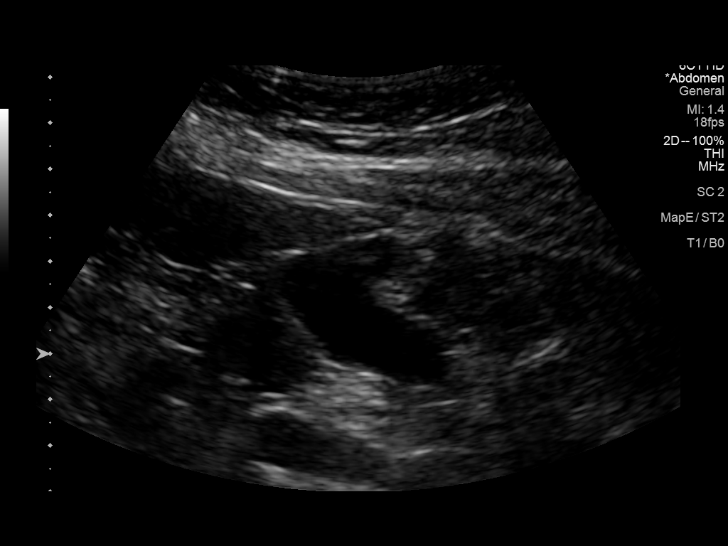
[im 18/47]
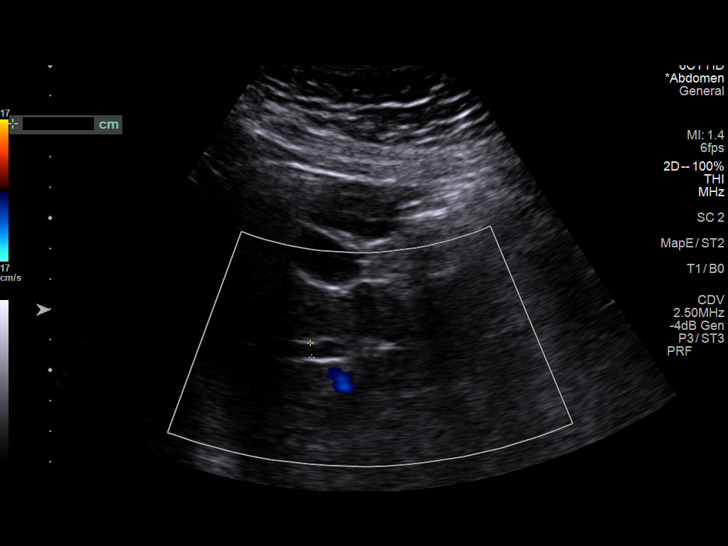
[im 22/47]
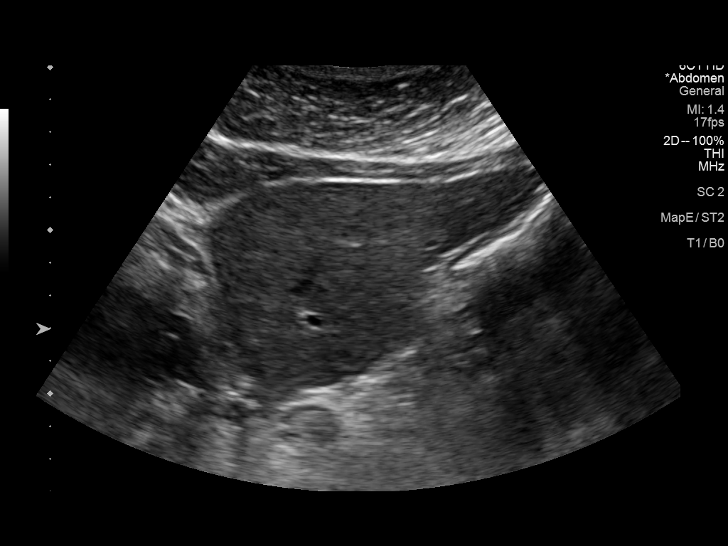
[im 25/47]
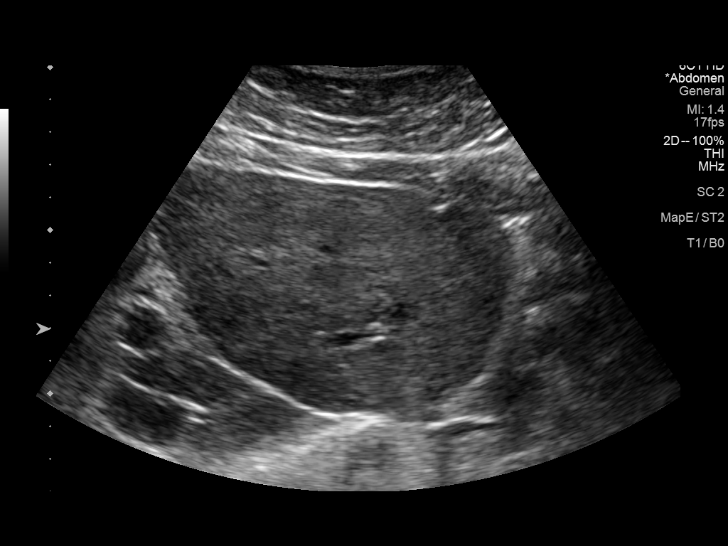
[im 29/47]
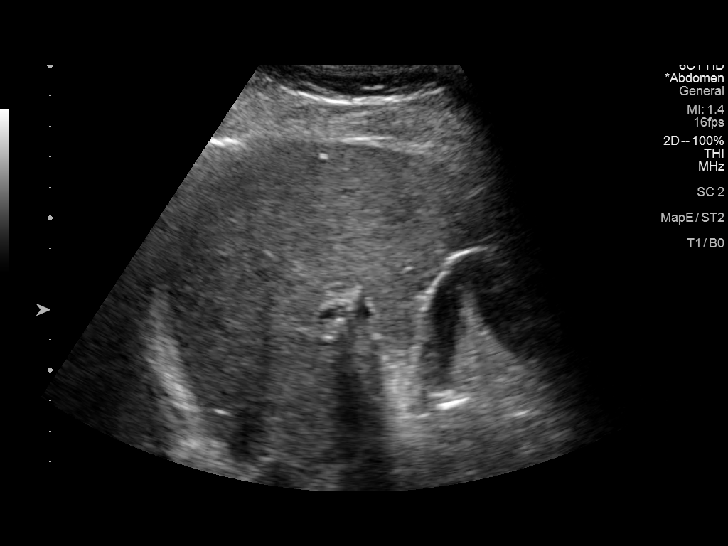
[im 31/47]
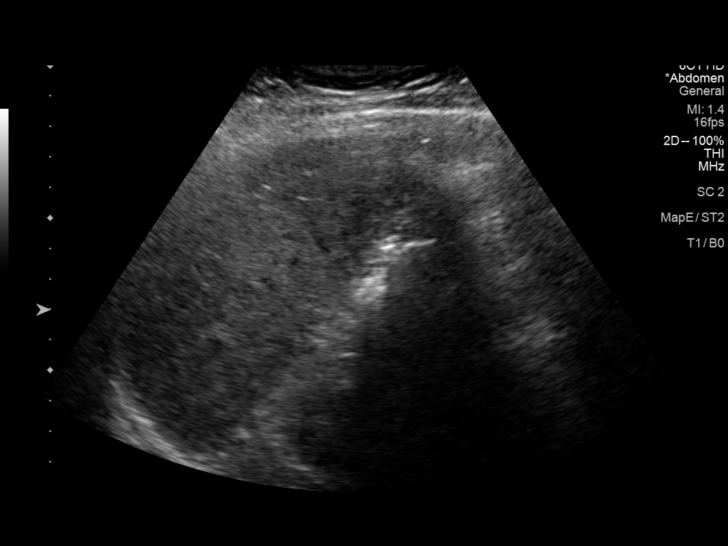
[im 35/47]
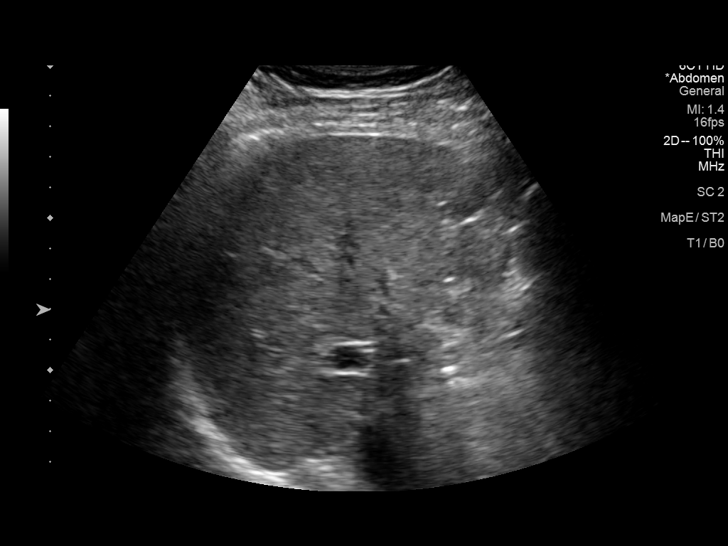
[im 39/47]
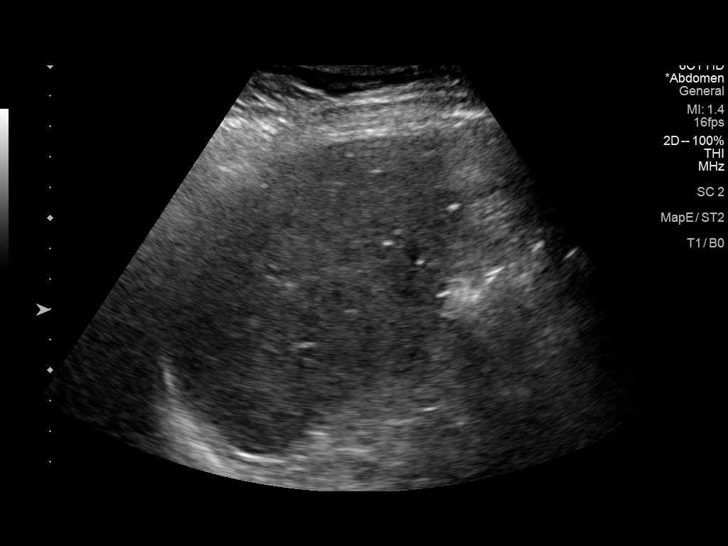
[im 43/47]
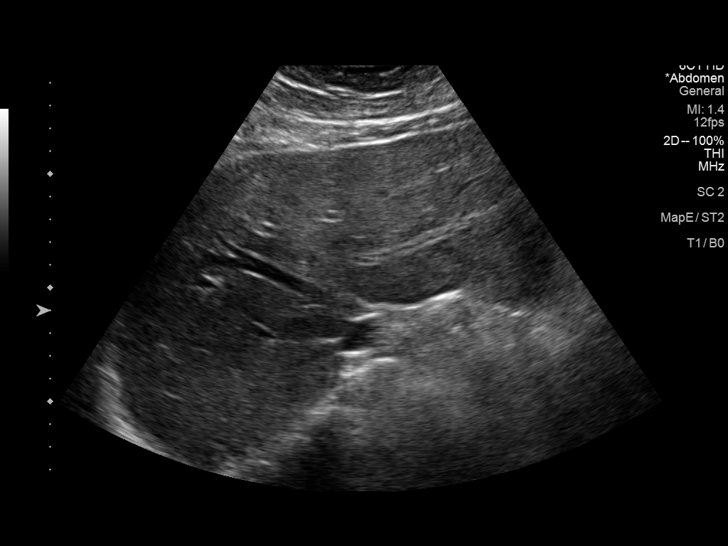
[im 47/47]
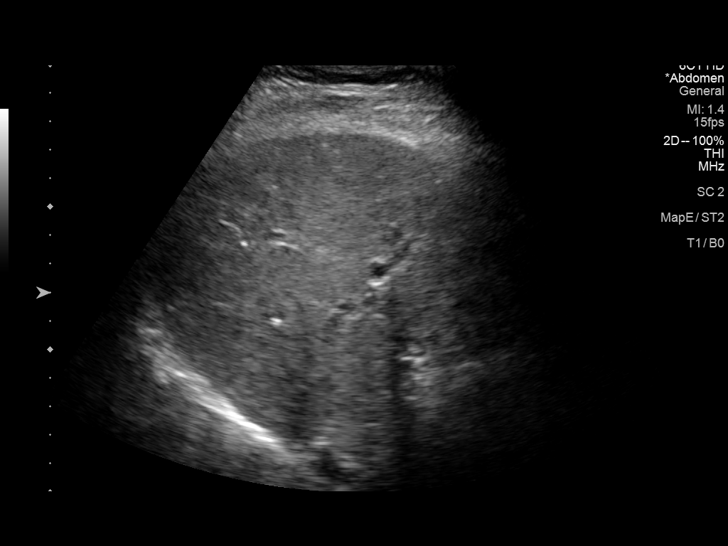

[14 of 25 positions shown; findings below may reference images not displayed]

FINDINGS: Gallbladder:

No gallstones or wall thickening visualized. No sonographic Murphy
sign noted by sonographer.

Common bile duct:

Diameter: 4.7 mm

Liver:

Heterogeneity is noted with nodularity consistent with the given
clinical history of cirrhosis. No focal mass is noted. Portal vein
is patent on color Doppler imaging with normal direction of blood
flow towards the liver.

Other: None.
IMPRESSION: Changes of cirrhosis of the liver.  No acute abnormality noted.

## 2024-05-29 ENCOUNTER — Other Ambulatory Visit: Payer: Self-pay | Admitting: Nurse Practitioner

## 2024-05-29 DIAGNOSIS — K7469 Other cirrhosis of liver: Secondary | ICD-10-CM

## 2024-06-12 ENCOUNTER — Ambulatory Visit
Admission: RE | Admit: 2024-06-12 | Discharge: 2024-06-12 | Disposition: A | Source: Ambulatory Visit | Attending: Nurse Practitioner

## 2024-06-12 DIAGNOSIS — K7469 Other cirrhosis of liver: Secondary | ICD-10-CM

## 2024-10-28 ENCOUNTER — Inpatient Hospital Stay: Attending: Internal Medicine

## 2024-11-05 ENCOUNTER — Inpatient Hospital Stay: Admitting: Internal Medicine
